# Patient Record
Sex: Male | Born: 1955 | Race: White | Hispanic: No | Marital: Married | Smoking: Never smoker
Health system: Southern US, Community
[De-identification: ages and names within clinical notes are randomized; demographics above are authoritative.]

## PROBLEM LIST (undated history)

## (undated) DIAGNOSIS — I10 Essential (primary) hypertension: Secondary | ICD-10-CM

## (undated) DIAGNOSIS — G4730 Sleep apnea, unspecified: Secondary | ICD-10-CM

## (undated) HISTORY — DX: Essential (primary) hypertension: I10

## (undated) HISTORY — DX: Sleep apnea, unspecified: G47.30

## (undated) HISTORY — PX: COLONOSCOPY: SHX174

---

## 1998-05-10 ENCOUNTER — Encounter: Admission: RE | Admit: 1998-05-10 | Discharge: 1998-05-10 | Payer: Self-pay | Admitting: Family Medicine

## 1998-05-15 ENCOUNTER — Encounter: Admission: RE | Admit: 1998-05-15 | Discharge: 1998-05-15 | Payer: Self-pay | Admitting: Family Medicine

## 2001-04-23 ENCOUNTER — Emergency Department (HOSPITAL_COMMUNITY): Admission: EM | Admit: 2001-04-23 | Discharge: 2001-04-23 | Payer: Self-pay | Admitting: Emergency Medicine

## 2001-04-23 ENCOUNTER — Encounter: Payer: Self-pay | Admitting: Emergency Medicine

## 2004-07-15 ENCOUNTER — Ambulatory Visit: Payer: Self-pay | Admitting: Internal Medicine

## 2004-07-24 ENCOUNTER — Ambulatory Visit (HOSPITAL_BASED_OUTPATIENT_CLINIC_OR_DEPARTMENT_OTHER): Admission: RE | Admit: 2004-07-24 | Discharge: 2004-07-24 | Payer: Self-pay | Admitting: Internal Medicine

## 2004-07-24 ENCOUNTER — Encounter: Payer: Self-pay | Admitting: Internal Medicine

## 2004-07-27 ENCOUNTER — Ambulatory Visit: Payer: Self-pay | Admitting: Internal Medicine

## 2004-08-07 ENCOUNTER — Ambulatory Visit: Payer: Self-pay | Admitting: Internal Medicine

## 2004-09-17 ENCOUNTER — Ambulatory Visit: Payer: Self-pay | Admitting: Internal Medicine

## 2004-10-22 ENCOUNTER — Ambulatory Visit: Payer: Self-pay | Admitting: Internal Medicine

## 2005-02-03 ENCOUNTER — Emergency Department (HOSPITAL_COMMUNITY): Admission: EM | Admit: 2005-02-03 | Discharge: 2005-02-03 | Payer: Self-pay | Admitting: Emergency Medicine

## 2005-04-16 ENCOUNTER — Emergency Department (HOSPITAL_COMMUNITY): Admission: EM | Admit: 2005-04-16 | Discharge: 2005-04-16 | Payer: Self-pay | Admitting: Family Medicine

## 2005-04-17 ENCOUNTER — Emergency Department (HOSPITAL_COMMUNITY): Admission: EM | Admit: 2005-04-17 | Discharge: 2005-04-17 | Payer: Self-pay | Admitting: Emergency Medicine

## 2005-04-23 ENCOUNTER — Ambulatory Visit: Payer: Self-pay | Admitting: Internal Medicine

## 2006-04-23 ENCOUNTER — Ambulatory Visit: Payer: Self-pay | Admitting: Internal Medicine

## 2007-01-17 ENCOUNTER — Ambulatory Visit: Payer: Self-pay | Admitting: Family Medicine

## 2007-01-17 DIAGNOSIS — J4540 Moderate persistent asthma, uncomplicated: Secondary | ICD-10-CM | POA: Insufficient documentation

## 2007-01-17 DIAGNOSIS — G4733 Obstructive sleep apnea (adult) (pediatric): Secondary | ICD-10-CM | POA: Insufficient documentation

## 2007-01-24 ENCOUNTER — Ambulatory Visit: Payer: Self-pay | Admitting: Family Medicine

## 2007-01-24 DIAGNOSIS — I10 Essential (primary) hypertension: Secondary | ICD-10-CM | POA: Insufficient documentation

## 2007-01-24 DIAGNOSIS — I152 Hypertension secondary to endocrine disorders: Secondary | ICD-10-CM | POA: Insufficient documentation

## 2007-01-25 LAB — CONVERTED CEMR LAB
ALT: 30 units/L (ref 0–53)
AST: 20 units/L (ref 0–37)
Albumin: 3.7 g/dL (ref 3.5–5.2)
Alkaline Phosphatase: 70 units/L (ref 39–117)
BUN: 9 mg/dL (ref 6–23)
Bilirubin, Direct: 0.1 mg/dL (ref 0.0–0.3)
CO2: 31 meq/L (ref 19–32)
Calcium: 9.2 mg/dL (ref 8.4–10.5)
Chloride: 108 meq/L (ref 96–112)
Cholesterol: 174 mg/dL (ref 0–200)
Creatinine, Ser: 0.9 mg/dL (ref 0.4–1.5)
GFR calc Af Amer: 115 mL/min
GFR calc non Af Amer: 95 mL/min
Glucose, Bld: 92 mg/dL (ref 70–99)
HDL: 34.3 mg/dL — ABNORMAL LOW (ref >39.0)
LDL Cholesterol: 125 mg/dL — ABNORMAL HIGH (ref 0–99)
PSA: 0.57 ng/mL (ref 0.10–4.00)
Potassium: 4.1 meq/L (ref 3.5–5.1)
Sodium: 143 meq/L (ref 135–145)
Total Bilirubin: 0.8 mg/dL (ref 0.3–1.2)
Total CHOL/HDL Ratio: 5.1
Total Protein: 6.4 g/dL (ref 6.0–8.3)
Triglycerides: 75 mg/dL (ref 0–149)
VLDL: 15 mg/dL (ref 0–40)

## 2007-02-07 ENCOUNTER — Ambulatory Visit: Payer: Self-pay | Admitting: Gastroenterology

## 2007-02-09 DIAGNOSIS — J309 Allergic rhinitis, unspecified: Secondary | ICD-10-CM | POA: Insufficient documentation

## 2007-02-21 ENCOUNTER — Encounter: Payer: Self-pay | Admitting: Family Medicine

## 2007-02-21 ENCOUNTER — Ambulatory Visit: Payer: Self-pay | Admitting: Gastroenterology

## 2007-02-21 ENCOUNTER — Encounter: Payer: Self-pay | Admitting: Gastroenterology

## 2007-02-24 ENCOUNTER — Encounter: Payer: Self-pay | Admitting: Family Medicine

## 2007-02-24 ENCOUNTER — Encounter (INDEPENDENT_AMBULATORY_CARE_PROVIDER_SITE_OTHER): Payer: Self-pay | Admitting: *Deleted

## 2007-04-14 ENCOUNTER — Encounter: Payer: Self-pay | Admitting: Internal Medicine

## 2007-05-02 ENCOUNTER — Encounter: Payer: Self-pay | Admitting: Internal Medicine

## 2007-07-28 ENCOUNTER — Ambulatory Visit: Payer: Self-pay | Admitting: Family Medicine

## 2007-09-05 ENCOUNTER — Encounter: Payer: Self-pay | Admitting: Internal Medicine

## 2007-09-05 ENCOUNTER — Encounter (INDEPENDENT_AMBULATORY_CARE_PROVIDER_SITE_OTHER): Payer: Self-pay | Admitting: *Deleted

## 2007-09-06 ENCOUNTER — Ambulatory Visit: Payer: Self-pay | Admitting: Family Medicine

## 2007-09-06 ENCOUNTER — Telehealth: Payer: Self-pay | Admitting: Family Medicine

## 2007-09-06 LAB — CONVERTED CEMR LAB
Bilirubin Urine: NEGATIVE
Blood in Urine, dipstick: NEGATIVE
Glucose, Urine, Semiquant: NEGATIVE
Ketones, urine, test strip: NEGATIVE
Nitrite: NEGATIVE
Protein, U semiquant: NEGATIVE
Specific Gravity, Urine: 1.015
Urobilinogen, UA: 0.2
WBC Urine, dipstick: NEGATIVE
pH: 6

## 2007-09-07 LAB — CONVERTED CEMR LAB
Basophils Absolute: 0 10*3/uL (ref 0.0–0.1)
Basophils Relative: 0.7 % (ref 0.0–1.0)
Eosinophils Absolute: 0.3 10*3/uL (ref 0.0–0.7)
Eosinophils Relative: 4.3 % (ref 0.0–5.0)
HCT: 43.5 % (ref 39.0–52.0)
Hemoglobin: 14.5 g/dL (ref 13.0–17.0)
Lymphocytes Relative: 31.5 % (ref 12.0–46.0)
MCHC: 33.4 g/dL (ref 30.0–36.0)
MCV: 86.2 fL (ref 78.0–100.0)
Monocytes Absolute: 0.7 10*3/uL (ref 0.1–1.0)
Monocytes Relative: 10.2 % (ref 3.0–12.0)
Neutro Abs: 3.7 10*3/uL (ref 1.4–7.7)
Neutrophils Relative %: 53.3 % (ref 43.0–77.0)
Platelets: 212 10*3/uL (ref 150–400)
RBC: 5.04 M/uL (ref 4.22–5.81)
RDW: 13.8 % (ref 11.5–14.6)
TSH: 1.59 microintl units/mL (ref 0.35–5.50)
WBC: 6.9 10*3/uL (ref 4.5–10.5)

## 2007-09-22 ENCOUNTER — Ambulatory Visit: Payer: Self-pay | Admitting: Family Medicine

## 2007-09-22 LAB — CONVERTED CEMR LAB
BUN: 10 mg/dL (ref 6–23)
CO2: 30 meq/L (ref 19–32)
Calcium: 9.4 mg/dL (ref 8.4–10.5)
Chloride: 106 meq/L (ref 96–112)
Creatinine, Ser: 1 mg/dL (ref 0.4–1.5)
GFR calc Af Amer: 101 mL/min
GFR calc non Af Amer: 84 mL/min
Glucose, Bld: 104 mg/dL — ABNORMAL HIGH (ref 70–99)
Potassium: 4 meq/L (ref 3.5–5.1)
Sodium: 141 meq/L (ref 135–145)

## 2007-09-23 ENCOUNTER — Ambulatory Visit: Payer: Self-pay | Admitting: Internal Medicine

## 2007-10-07 ENCOUNTER — Ambulatory Visit: Payer: Self-pay | Admitting: Family Medicine

## 2007-10-07 LAB — CONVERTED CEMR LAB
BUN: 12 mg/dL (ref 6–23)
CO2: 29 meq/L (ref 19–32)
Calcium: 9.5 mg/dL (ref 8.4–10.5)
Chloride: 101 meq/L (ref 96–112)
Creatinine, Ser: 1 mg/dL (ref 0.4–1.5)
GFR calc Af Amer: 101 mL/min
GFR calc non Af Amer: 84 mL/min
Glucose, Bld: 86 mg/dL (ref 70–99)
Potassium: 3.9 meq/L (ref 3.5–5.1)
Sodium: 138 meq/L (ref 135–145)

## 2007-11-10 ENCOUNTER — Ambulatory Visit: Payer: Self-pay | Admitting: Family Medicine

## 2007-11-10 DIAGNOSIS — N529 Male erectile dysfunction, unspecified: Secondary | ICD-10-CM | POA: Insufficient documentation

## 2008-02-12 ENCOUNTER — Emergency Department (HOSPITAL_COMMUNITY): Admission: EM | Admit: 2008-02-12 | Discharge: 2008-02-12 | Payer: Self-pay | Admitting: Family Medicine

## 2008-02-13 ENCOUNTER — Encounter (INDEPENDENT_AMBULATORY_CARE_PROVIDER_SITE_OTHER): Payer: Self-pay | Admitting: *Deleted

## 2008-02-13 ENCOUNTER — Ambulatory Visit: Payer: Self-pay | Admitting: Family Medicine

## 2008-02-15 ENCOUNTER — Telehealth (INDEPENDENT_AMBULATORY_CARE_PROVIDER_SITE_OTHER): Payer: Self-pay | Admitting: *Deleted

## 2008-02-16 ENCOUNTER — Encounter (INDEPENDENT_AMBULATORY_CARE_PROVIDER_SITE_OTHER): Payer: Self-pay | Admitting: *Deleted

## 2008-05-10 ENCOUNTER — Encounter (INDEPENDENT_AMBULATORY_CARE_PROVIDER_SITE_OTHER): Payer: Self-pay | Admitting: *Deleted

## 2008-06-15 ENCOUNTER — Ambulatory Visit: Payer: Self-pay | Admitting: Family Medicine

## 2008-06-22 ENCOUNTER — Ambulatory Visit: Payer: Self-pay | Admitting: Family Medicine

## 2008-06-22 LAB — CONVERTED CEMR LAB
ALT: 32 units/L (ref 0–53)
AST: 29 units/L (ref 0–37)
Albumin: 4.1 g/dL (ref 3.5–5.2)
Alkaline Phosphatase: 66 units/L (ref 39–117)
BUN: 11 mg/dL (ref 6–23)
Bilirubin, Direct: 0.1 mg/dL (ref 0.0–0.3)
CO2: 32 meq/L (ref 19–32)
Calcium: 9.5 mg/dL (ref 8.4–10.5)
Chloride: 101 meq/L (ref 96–112)
Cholesterol, target level: 200 mg/dL
Cholesterol: 209 mg/dL (ref 0–200)
Creatinine, Ser: 1 mg/dL (ref 0.4–1.5)
Direct LDL: 139.1 mg/dL
GFR calc Af Amer: 101 mL/min
GFR calc non Af Amer: 84 mL/min
Glucose, Bld: 98 mg/dL (ref 70–99)
HDL goal, serum: 40 mg/dL
HDL: 35.5 mg/dL — ABNORMAL LOW (ref >39.0)
LDL Goal: 130 mg/dL
PSA: 0.43 ng/mL (ref 0.10–4.00)
PSA: 0.43 ng/mL
Potassium: 3.9 meq/L (ref 3.5–5.1)
Sodium: 140 meq/L (ref 135–145)
Total Bilirubin: 0.9 mg/dL (ref 0.3–1.2)
Total CHOL/HDL Ratio: 5.9
Total Protein: 7 g/dL (ref 6.0–8.3)
Triglycerides: 121 mg/dL (ref 0–149)
VLDL: 24 mg/dL (ref 0–40)

## 2008-07-16 ENCOUNTER — Telehealth: Payer: Self-pay | Admitting: Family Medicine

## 2008-09-13 ENCOUNTER — Ambulatory Visit: Payer: Self-pay | Admitting: Family Medicine

## 2008-09-17 LAB — CONVERTED CEMR LAB
Cholesterol: 186 mg/dL (ref 0–200)
HDL: 38 mg/dL — ABNORMAL LOW (ref >39.00)
LDL Cholesterol: 132 mg/dL — ABNORMAL HIGH (ref 0–99)
Total CHOL/HDL Ratio: 5
Triglycerides: 79 mg/dL (ref 0.0–149.0)
VLDL: 15.8 mg/dL (ref 0.0–40.0)

## 2008-09-21 ENCOUNTER — Ambulatory Visit: Payer: Self-pay | Admitting: Internal Medicine

## 2008-10-19 ENCOUNTER — Encounter: Payer: Self-pay | Admitting: Internal Medicine

## 2009-03-20 ENCOUNTER — Ambulatory Visit: Payer: Self-pay | Admitting: Family Medicine

## 2009-03-25 LAB — CONVERTED CEMR LAB
Cholesterol: 190 mg/dL (ref 0–200)
HDL: 41.9 mg/dL (ref >39.00)
LDL Cholesterol: 134 mg/dL — ABNORMAL HIGH (ref 0–99)
Total CHOL/HDL Ratio: 5
Triglycerides: 71 mg/dL (ref 0.0–149.0)
VLDL: 14.2 mg/dL (ref 0.0–40.0)

## 2009-06-25 ENCOUNTER — Encounter (INDEPENDENT_AMBULATORY_CARE_PROVIDER_SITE_OTHER): Payer: Self-pay | Admitting: *Deleted

## 2009-06-25 ENCOUNTER — Ambulatory Visit: Payer: Self-pay | Admitting: Family Medicine

## 2009-06-26 ENCOUNTER — Ambulatory Visit: Payer: Self-pay | Admitting: Family Medicine

## 2009-09-05 ENCOUNTER — Telehealth: Payer: Self-pay | Admitting: Family Medicine

## 2009-09-16 ENCOUNTER — Ambulatory Visit: Payer: Self-pay | Admitting: Family Medicine

## 2009-09-18 LAB — CONVERTED CEMR LAB
ALT: 35 units/L (ref 0–53)
AST: 27 units/L (ref 0–37)
Albumin: 4 g/dL (ref 3.5–5.2)
Alkaline Phosphatase: 50 units/L (ref 39–117)
BUN: 13 mg/dL (ref 6–23)
Bilirubin, Direct: 0.1 mg/dL (ref 0.0–0.3)
CO2: 30 meq/L (ref 19–32)
Calcium: 9.2 mg/dL (ref 8.4–10.5)
Chloride: 103 meq/L (ref 96–112)
Cholesterol: 204 mg/dL — ABNORMAL HIGH (ref 0–200)
Creatinine, Ser: 1 mg/dL (ref 0.4–1.5)
Direct LDL: 141.7 mg/dL
GFR calc non Af Amer: 86 mL/min (ref >60)
Glucose, Bld: 96 mg/dL (ref 70–99)
HDL: 50.2 mg/dL (ref >39.00)
PSA: 0.63 ng/mL (ref 0.10–4.00)
Potassium: 4.1 meq/L (ref 3.5–5.1)
Sodium: 140 meq/L (ref 135–145)
Total Bilirubin: 0.6 mg/dL (ref 0.3–1.2)
Total CHOL/HDL Ratio: 4
Total Protein: 6.7 g/dL (ref 6.0–8.3)
Triglycerides: 78 mg/dL (ref 0.0–149.0)
VLDL: 15.6 mg/dL (ref 0.0–40.0)

## 2009-09-20 ENCOUNTER — Ambulatory Visit: Payer: Self-pay | Admitting: Internal Medicine

## 2009-10-09 ENCOUNTER — Ambulatory Visit: Payer: Self-pay | Admitting: Family Medicine

## 2009-10-09 DIAGNOSIS — M67919 Unspecified disorder of synovium and tendon, unspecified shoulder: Secondary | ICD-10-CM | POA: Insufficient documentation

## 2009-10-09 DIAGNOSIS — M719 Bursopathy, unspecified: Secondary | ICD-10-CM

## 2009-11-27 ENCOUNTER — Telehealth: Payer: Self-pay | Admitting: Family Medicine

## 2009-12-04 ENCOUNTER — Ambulatory Visit: Payer: Self-pay | Admitting: Family Medicine

## 2009-12-16 ENCOUNTER — Encounter: Payer: Self-pay | Admitting: Family Medicine

## 2009-12-23 ENCOUNTER — Encounter: Payer: Self-pay | Admitting: Family Medicine

## 2010-01-03 ENCOUNTER — Ambulatory Visit: Payer: Self-pay | Admitting: Family Medicine

## 2010-02-19 ENCOUNTER — Telehealth: Payer: Self-pay | Admitting: Family Medicine

## 2010-03-12 ENCOUNTER — Telehealth: Payer: Self-pay | Admitting: Family Medicine

## 2010-03-25 ENCOUNTER — Encounter: Payer: Self-pay | Admitting: Family Medicine

## 2010-03-26 ENCOUNTER — Telehealth: Payer: Self-pay | Admitting: Family Medicine

## 2010-05-27 ENCOUNTER — Ambulatory Visit
Admission: RE | Admit: 2010-05-27 | Discharge: 2010-05-27 | Payer: Self-pay | Source: Home / Self Care | Attending: Family Medicine | Admitting: Family Medicine

## 2010-05-27 NOTE — Miscellaneous (Signed)
Summary: PT Eval/[facility]  PT Eval/[facility]   Imported By: Lanelle Bal 12/23/2009 09:32:11  _____________________________________________________________________  External Attachment:    Type:   Image     Comment:   External Document

## 2010-05-27 NOTE — Progress Notes (Signed)
Summary: refill request for diclofenac  Phone Note Refill Request Call back at [phone] Message from:  Patient  Refills Requested: Medication #1:  DICLOFENAC SODIUM 75 MG TBEC 1 tab by mouth two times a day Pt is asking for a refill to be sent to [facility] in [place].  He has appt with Dr. Ermalene Searing on 04/02/10.  Initial call taken by: Lowella Petties CMA, AAMA,  March 26, 2010 3:31 PM    Prescriptions: DICLOFENAC SODIUM 75 MG TBEC (DICLOFENAC SODIUM) 1 tab by mouth two times a day  #30 x 0   Entered and Authorized by:   Hannah Beat MD   Signed by:   Hannah Beat MD on 03/26/2010   Method used:   Electronically to        [facility] [address] (retail)       [address] [place], [place]  [address]       Ph: [phone]       Fax: [phone]   RxID:   [id number]

## 2010-05-27 NOTE — Miscellaneous (Signed)
Summary: PT Update/[facility]  PT Update/[facility]   Imported By: Lanelle Bal 01/01/2010 11:56:50  _____________________________________________________________________  External Attachment:    Type:   Image     Comment:   External Document

## 2010-05-27 NOTE — Assessment & Plan Note (Signed)
Summary: INJECT SHOULDER, PER DR BEDSOLE   Vital Signs:  Patient profile:   55 year old male Height:      72 inches Weight:      257.0 pounds BMI:     34.98 Temp:     98.6 degrees F oral Pulse rate:   60 / minute Pulse rhythm:   regular BP sitting:   120 / 76  (left arm) Cuff size:   large  Vitals Entered By: Benny Lennert CMA Duncan Dull) (December 04, 2009 3:53 PM)  History of Present Illness: Dr. Ermalene Searing has requested a consult for the evaluation of shoulder pain:  The patient noted above presents with shoulder pain that has been ongoing for 3 mo there is no history of trauma or accident. The patient denies neck pain or radicular symptoms. Denies dislocation, subluxation, separation of the shoulder. The patient does complain of pain in the overhead plane.  Medications Tried: voltaren Tried PT: No  Prior shoulder Injury: No Prior surgery: No Prior fracture: No   REVIEW OF SYSTEMS  GEN: No systemic complaints, no fevers, chills, sweats, or other acute illnesses MSK: Detailed in the HPI GI: tolerating PO intake without difficulty Neuro: No numbness, parasthesias, or tingling associated. Otherwise the pertinent positives of the ROS are noted above.    Allergies: 1)  ! * Flu Vaccination  Past History:  Past medical, surgical, family and social histories (including risk factors) reviewed, and no changes noted (except as noted below).  Past Medical History: Reviewed history from 09/23/2007 and no changes required. Asthma Hypertension Sleep Apnea  Past Surgical History: Reviewed history from 01/17/2007 and no changes required. Denies surgical history  Family History: Reviewed history from 09/06/2007 and no changes required. father died age 41 prostate cancer, HTN mother died age 40 ? breast cancer brother HTN no MI < age 47 son with Grave's disease  Social History: Reviewed history from 09/20/2009 and no changes required. Occupation:custodian, yard  work Married Never Smoked Alcohol use-no Drug use-no Regular exercise-yes, walks 1 mile day Diet: recent diet changes,  fuits and veggies, water, stopping soda  Physical Exam  General:  GEN: Well-developed,well-nourished,in no acute distress; alert,appropriate and cooperative throughout examination HEENT: Normocephalic and atraumatic without obvious abnormalities. No apparent alopecia or balding. Ears, externally no deformities PULM: Breathing comfortably in no respiratory distress EXT: No clubbing, cyanosis, or edema PSYCH: Normally interactive. Cooperative during the interview. Pleasant. Friendly and conversant. Not anxious or depressed appearing. Normal, full affect.  Msk:  Shoulder: L Inspection: No muscle wasting or winging Ecchymosis/edema: neg  AC joint, scapula, clavicle: NT Cervical spine: NT, full ROM Spurling's: neg Abduction: full, 5/5 Flexion: full, 5/5 IR, full, lift-off: 5/5 ER at neutral: full, 5/5 AC crossover: neg Neer: pos Hawkins: pos Drop Test: neg Empty Can: pos Supraspinatus insertion: mild-mod T Bicipital groove: NT Speed's: neg Yergason's: neg Sulcus sign: neg Scapular dyskinesis: none C5-T1 intact  Neuro: Sensation intact Grip 5/5    Impression & Recommendations:  Problem # 1:  ROTATOR CUFF SYNDROME, LEFT (ICD-726.10) Shoulder anatomy was reviewed.  Recommendations include Rotator cuff strengthening and scapular stabilization exercises were reviewed with the patient.  A handout was given based on a shoulder program from [facility]  Retraining shoulder mechanics and function was emphasized to the patient with rehab done at least 5-6 days a week.  Recommend formal PT to assist with scapular stabilization and RTC strengthening.   SubAC Injection, L Verbal consent was obtained from the patient. Risks, benefits, and alternatives were explained. Patient  prepped with Betadine and Ethyl Chloride used for anesthesia. The subacromial space was  injected using the posterior approach. The patient tolerated the procedure well and had decreased pain post injection. No complications. Injection: 9 cc of Lidocaine 1% and 1cc of Kenalog 40 mg. Needle: 22 gauge   cc: Dr. Ermalene Searing  Orders: Physical Therapy Referral (PT) Joint Aspirate / Injection, Large (20610) Kenalog 10mg  (4units) (J3301)  Complete Medication List: 1)  Advair Diskus 100-50 Mcg/dose Misc (Fluticasone-salmeterol) .Marland Kitchen.. 1 inhalation two times a day 2)  Cpap-11cm [facility]  3)  Aspirin 81 Mg Tabs (Aspirin) .... Take 1 tablet by mouth once a day 4)  Lisinopril-hydrochlorothiazide 20-25 Mg Tabs (Lisinopril-hydrochlorothiazide) .... Take 1 tablet by mouth once a day 5)  Ventolin Hfa 108 (90 Base) Mcg/act Aers (Albuterol sulfate) .... 2 puffs four times a day as needed 6)  Multivitamins Tabs (Multiple vitamin) .... Take 1 by mouth once daily 7)  Diclofenac Sodium 75 Mg Tbec (Diclofenac sodium) .Marland Kitchen.. 1 tab by mouth two times a day  Patient Instructions: 1)  Referral Appointment Information 2)  Day/Date: 3)  Time: 4)  Place/MD: 5)  Address: 6)  Phone/Fax: 7)  Patient given appointment information. Information/Orders faxed/mailed.   Current Allergies (reviewed today): ! * FLU VACCINATION

## 2010-05-27 NOTE — Letter (Signed)
Summary: Out of Work  [facility] at [facility]  [address] [place], [place] [address]   Phone: [phone]  Fax: [phone]    June 25, 2009   Employee:  JILES GOYA    To Whom It May Concern:   For Medical reasons, please excuse the above named employee from work for the following dates:  Start:  June 25, 2009 12:57 PM   End:   Mat return on March 7th 2011  If you need additional information, please feel free to contact our office.         Sincerely,    Kerby Nora MD

## 2010-05-27 NOTE — Assessment & Plan Note (Signed)
Summary: ? PULLED MUSCLE IN BACK   Vital Signs:  Patient profile:   55 year old male Weight:      253 pounds Temp:     99.2 degrees F oral Pulse rate:   56 / minute Pulse rhythm:   regular BP sitting:   138 / 90  (right arm) Cuff size:   large  Vitals Entered By: Lowella Petties CMA (January 03, 2010 12:19 PM) CC: Left low back pain   History of Present Illness: 2 days ago while doing yard work...was pulling on weeds rooted in ground. Since then low back spasm and pain. Left lower back. No weakness, no numbness. Minimal improvement with aleve and advil. No radaiting pain.  No fever.   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 1 Inhalation Two Times A Day 2)  Cpap-11cm [facility] 3)  Aspirin 81 Mg  Tabs (Aspirin) .... Take 1 Tablet By Mouth Once A Day 4)  Lisinopril-Hydrochlorothiazide 20-25 Mg  Tabs (Lisinopril-Hydrochlorothiazide) .... Take 1 Tablet By Mouth Once A Day 5)  Ventolin Hfa 108 (90 Base) Mcg/act  Aers (Albuterol Sulfate) .... 2 Puffs Four Times A Day As Needed 6)  Multivitamins  Tabs (Multiple Vitamin) .... Take 1 By Mouth Once Daily 7)  Diclofenac Sodium 75 Mg Tbec (Diclofenac Sodium) .Marland Kitchen.. 1 Tab By Mouth Two Times A Day  Allergies (verified): 1)  ! * Flu Vaccination  Past History:  Past  medical, surgical, family and social histories (including risk factors) reviewed, and no changes noted (except as noted below).  Past Medical History: Reviewed history from 09/23/2007 and no changes required. Asthma Hypertension Sleep Apnea  Past Surgical History: Reviewed history from 01/17/2007 and no changes required. Denies surgical history  Family History: Reviewed history from 09/06/2007 and no changes required. father died age 60 prostate cancer, HTN mother died age 27 ? breast cancer brother HTN no MI < age 58 son with Grave's disease  Social History: Reviewed history from 09/20/2009 and no changes required. 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 CV:  Denies chest pain or discomfort. Resp:  Denies shortness of breath. GU:  Denies dysuria.  Physical Exam  General:  Overweight male in NAD Mouth:  MMM Lungs:  Normal respiratory effort, chest expands symmetrically. Lungs are clear to auscultation, no crackles or wheezes. Heart:  Normal rate and regular rhythm. S1 and S2 normal without gallop, murmur, click, rub or other extra sounds. Abdomen:  Bowel sounds positive,abdomen soft and non-tender without masses, organomegaly or hernias noted. Msk:  ttp left paraspinous muscle, no vertebral ttp, neg SLR and Faber's  Neurologic:  No cranial nerve deficits noted. Station and gait are normal.  DTRs are symmetrical throughout. Sensory, motor  and coordinative functions appear intact.   Impression & Recommendations:  Problem # 1:  LUMBOSACRAL STRAIN, ACUTE (ICD-846.0) Treat with heat, NSAIds, muscle relaxant and gentle stretching. Info given.  Work limitations given.  Follow up if not improving in 2 weeks.   Complete Medication List: 1)  Advair Diskus 100-50 Mcg/dose  Misc (Fluticasone-salmeterol) .Marland Kitchen.. 1 inhalation two times a day 2)  Cpap-11cm [facility]  3)  Aspirin 81 Mg Tabs (Aspirin) .... Take 1 tablet by mouth once a day 4)  Lisinopril-hydrochlorothiazide 20-25 Mg Tabs (Lisinopril-hydrochlorothiazide) .... Take 1 tablet by mouth once a day 5)  Ventolin Hfa 108 (90 Base) Mcg/act Aers (Albuterol sulfate) .... 2 puffs four times a day as needed 6)  Multivitamins Tabs (Multiple vitamin) .... Take 1 by mouth once daily 7)  Diclofenac Sodium 75 Mg Tbec (Diclofenac sodium) .Marland Kitchen.. 1 tab by mouth two times a day 8)  Cyclobenzaprine Hcl 10 Mg Tabs (Cyclobenzaprine hcl) .Marland Kitchen.. 1 tab by mouth at bedtime as needed pain  Patient Instructions: 1)  Heat on painful area, start diclofenac as needed pain during the day, muscle relaxant at night. 2)   Gentle stretching exercsies. 3)   Follow up if not improving in 2 weeks. 4)  No lifting greater than 10 lbs, no repetitive bending or twisting at waist.  Prescriptions: CYCLOBENZAPRINE HCL 10 MG TABS (CYCLOBENZAPRINE HCL) 1 tab by mouth at bedtime as needed pain  #15 x 0   Entered and Authorized by:   Kerby Nora MD   Signed by:   Kerby Nora MD on 01/03/2010   Method used:   Print then Give to Patient   RxID:   [id number] DICLOFENAC SODIUM 75 MG TBEC (DICLOFENAC SODIUM) 1 tab by mouth two times a day  #30 x 0   Entered and Authorized by:   Kerby Nora MD   Signed by:   Kerby Nora MD on 01/03/2010   Method used:   Print then Give to Patient   RxID:   [id number]   Prior Medications (reviewed today): ADVAIR DISKUS 100-50 MCG/DOSE  MISC (FLUTICASONE-SALMETEROL) 1 inhalation two times a day CPAP-11CM [facility] ()  ASPIRIN 81 MG  TABS (ASPIRIN) Take 1 tablet by mouth once a day LISINOPRIL-HYDROCHLOROTHIAZIDE 20-25 MG  TABS (LISINOPRIL-HYDROCHLOROTHIAZIDE) Take 1 tablet by mouth once a day VENTOLIN HFA 108 (90 BASE) MCG/ACT  AERS (ALBUTEROL SULFATE) 2 puffs four times a day as  needed MULTIVITAMINS  TABS (MULTIPLE VITAMIN) take 1 by mouth once daily Current Allergies (reviewed today): ! * FLU VACCINATION

## 2010-05-27 NOTE — Assessment & Plan Note (Signed)
Summary: ROA FOR FOLLOW-UP/J[facility]   Vital Signs:  Patient profile:   55 year old male Height:      72 inches Weight:      247.6 pounds BMI:     33.70 O2 Sat:      97 % on RA Temp:     97.9 degrees F oral Pulse rate:   72 / minute Pulse rhythm:   regular BP sitting:   122 / 78  (left arm) Cuff size:   regular  Vitals Entered By: Benny Lennert CMA (AAMA) (June 26, 2009 11:59 AM)  O2 Flow:  RA  History of Present Illness: Chief complaint follow up flu  Acute asthma exacerbation: s/p kenalog injection on day 1 prednisone taper. On day 2 of tamiflu.  Today he states he is feeling better. Breathing improved. He is using albuterol every 6 hours. helps some temporarily.  Coughing a lot at night given increase in mucus. Energy better.  No fever.   Allergies: 1)  ! * Flu Vaccination  Physical Exam  General:  obese appearing male in NAD Mouth:  MMM Neck:  no carotid bruit or thyromegaly no cervical or supraclavicular lymphadenopathy  Lungs:  decrease in wheeze and improvement in air movement  No rhonchi no rales  Heart:  Normal rate and regular rhythm. S1 and S2 normal without gallop, murmur, click, rub or other extra sounds. Pulses:  R and L posterior tibial pulses are full and equal bilaterally  Extremities:  no edema    Impression & Recommendations:  Problem # 1:  ASTHMA, WITH ACUTE EXACERBATION (ICD-493.92) :Improved but peak flows still yellow range. Continue prednisone, albuterol as needed and treatment for flu. Call if SOB not continuing to improve.  HAs follow up with Dr. Shelle Iron in 08/2009. GAve codeine/guafenesin for cough keeping him up at night.     Singulair 10 Mg Tabs (Montelukast sodium) .Marland Kitchen... Take 1 by mouth once daily His updated medication list for this problem includes:    Advair Diskus 100-50 Mcg/dose Misc (Fluticasone-salmeterol) .Marland Kitchen... 1 inhalation two times a day    Ventolin Hfa 108 (90 Base) Mcg/act Aers (Albuterol sulfate) .Marland Kitchen... 2 puffs four times a day  as needed    Prednisone 20 Mg Tabs (Prednisone) .Marland KitchenMarland KitchenMarland KitchenMarland Kitchen 3 tabs by mouth daily x 3 days, then 2 tabs by mouth daily x 2 days then 1 tab by mouth daily x 2 days  Problem # 2:  INFLUENZA (ICD-487.8)  Complete Medication List: 1)  Advair Diskus 100-50 Mcg/dose Misc (Fluticasone-salmeterol) .Marland Kitchen.. 1 inhalation two times a day 2)  Cpap-11cm [facility]  3)  Aspirin 81 Mg Tabs (Aspirin) .... Take 1 tablet by mouth once a day 4)  Vitamin C 500 Mg Tabs (Ascorbic acid) .... Take 1 tablet by mouth once a day 5)  Lisinopril-hydrochlorothiazide 20-25 Mg Tabs (Lisinopril-hydrochlorothiazide) .... Take 1 tablet by mouth once a day 6)  Ventolin Hfa 108 (90 Base) Mcg/act Aers (Albuterol sulfate) .... 2 puffs four times a day as needed 7)  Viagra 100 Mg Tabs (Sildenafil citrate) .... 1/4/to 1/2 tab by mouth as needed prior to sexual activity 8)  Midrin 325-65-100 Mg Caps (Apap-isometheptene-dichloral) .Marland Kitchen.. 1-2 caps by mouth q 4 hours 9)  Tamiflu 75 Mg Caps (Oseltamivir phosphate) .... Take one tablet by mouth twice a day for five days 10)  Prednisone 20 Mg Tabs (Prednisone) .... 3 tabs by mouth daily x 3 days, then 2 tabs by mouth daily x 2 days then 1 tab by mouth daily  x 2 days 11)  Cheratussin Ac 100-10 Mg/75ml Syrp (Guaifenesin-codeine) .Marland Kitchen.. 1-2 tsp by mouth at bedtime as needed cough  Patient Instructions: 1)  SCheduled in next few months CPX with fasting labs prior.  CMET, lipids Dx 272.0, PSA Dx v76.44 2)  Call if breathing worsening again.  Prescriptions: CHERATUSSIN AC 100-10 MG/5ML SYRP (GUAIFENESIN-CODEINE) 1-2 tsp by mouth at bedtime as needed cough  #8 oz x 0   Entered and Authorized by:   Kerby Nora MD   Signed by:   Kerby Nora MD on 06/26/2009   Method used:   Print then Give to Patient   RxID:   [id number]   Current Allergies (reviewed today): ! * FLU VACCINATION

## 2010-05-27 NOTE — Medication Information (Signed)
Summary: ED Device/[facility]  ED Device/[facility]   Imported By: Lanelle Bal 03/29/2010 09:20:57  _____________________________________________________________________  External Attachment:    Type:   Image     Comment:   External Document

## 2010-05-27 NOTE — Assessment & Plan Note (Signed)
Summary: LEFT SHOULDER PAIN/CLE   Vital Signs:  Patient profile:   55 year old male Height:      72 inches Weight:      254.4 pounds BMI:     34.63 Temp:     98.2 degrees F oral Pulse rate:   57 / minute Pulse rhythm:   regular BP sitting:   130 / 84  (left arm) Cuff size:   large  Vitals Entered By: Benny Lennert CMA Duncan Dull) (October 09, 2009 3:42 PM)  History of Present Illness: Chief complaint Left shoulder pain   In past 1 month left shoulder pain.Marland Kitchen no fall/no injuries. Sudden onset. Hurts most when lifting arm above head. No swelling or redness in joint. No numbness, no tingling, no weakness.  No neck pain. Pain 7-8/10. Using OTC tylenol arthritis, advil, asa...some  improvement  No past history of shoulder issues.  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 1 Inhalation Two Times A Day 2)  Cpap-11cm [facility] 3)  Aspirin 81 Mg  Tabs (Aspirin) .... Take 1 Tablet By Mouth Once A Day 4)  Lisinopril-Hydrochlorothiazide 20-25 Mg  Tabs (Lisinopril-Hydrochlorothiazide) .... Take 1 Tablet By Mouth Once A Day 5)  Ventolin Hfa 108 (90 Base) Mcg/act  Aers (Albuterol Sulfate) .... 2 Puffs Four Times A Day As Needed 6)  Multivitamins  Tabs (Multiple Vitamin) .... Take 1 By Mouth Once Daily 7)  Diclofenac Sodium 75 Mg Tbec (Diclofenac Sodium) .Marland Kitchen.. 1 Tab By Mouth  Two Times A Day  Allergies: 1)  ! * Flu Vaccination  Past History:  Past medical, surgical, family and social histories (including risk factors) reviewed, and no changes noted (except as noted below).  Past Medical History: Reviewed history from 09/23/2007 and no changes required. Asthma Hypertension Sleep Apnea  Past Surgical History: Reviewed history from 01/17/2007 and no changes required. Denies surgical history  Family History: Reviewed history from 09/06/2007 and no changes required. father died age 62 prostate cancer, HTN mother died age 30 ? breast cancer brother HTN no MI < age 23 son with Grave's disease  Social History: Reviewed history from 09/20/2009 and no changes required. 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 CV:  Denies chest pain or discomfort. Resp:  Denies shortness of breath.  Physical Exam  General:  Well-developed,well-nourished,in no acute distress; alert,appropriate and cooperative throughout examination Mouth:  Oral mucosa and oropharynx without lesions or exudates.  Teeth in good repair. Neck:  no carotid bruit or thyromegaly no cervical or supraclavicular lymphadenopathy  Lungs:  Normal respiratory effort, chest expands symmetrically. Lungs are clear to auscultation, no crackles or wheezes. Heart:  Normal rate and regular rhythm. S1 and S2 normal without gallop, murmur, click, rub or other extra sounds. Msk:  ttp subacromial ant  and posterior, pain with int and external rotation, positive impingement sign/neer's/empty can test. neg drop arm. neg spurling strength 5/5 upper ext and in rotator cuff muscles.    Impression & Recommendations:  Problem # 1:  ROTATOR CUFF SYNDROME, LEFT (ICD-726.10) NSAID, exercsies, ice, info given.  Follow up for injection if not improving in 2 weeks.   Complete Medication List: 1)  Advair Diskus 100-50 Mcg/dose Misc (Fluticasone-salmeterol) .Marland Kitchen.. 1 inhalation two times a day 2)  Cpap-11cm [facility]  3)  Aspirin 81 Mg Tabs (Aspirin) .... Take 1 tablet by mouth once a day 4)  Lisinopril-hydrochlorothiazide 20-25 Mg Tabs (Lisinopril-hydrochlorothiazide) .... Take 1 tablet by mouth once a day 5)  Ventolin Hfa 108 (90 Base) Mcg/act Aers (Albuterol sulfate) .... 2 puffs four times a day as needed 6)  Multivitamins Tabs (Multiple vitamin) .... Take 1 by mouth once daily 7)  Diclofenac Sodium 75 Mg Tbec (Diclofenac sodium) .Marland Kitchen.. 1 tab by mouth two times a day  Patient Instructions: 1)  No lifting greater than 10 lbs or repetitive reaching or twisting  in left shoulder x 2 weeks. 2)   Diclofenac two times a day x 1-2 weeks...take with food, call if stomach upset. Use as needed once pain improving. 3)  Call for follow up in 2 weeks IF not improving. Prescriptions: DICLOFENAC SODIUM 75 MG TBEC (DICLOFENAC SODIUM) 1 tab by mouth two times a day  #30 x 0   Entered and Authorized by:   Kerby Nora MD   Signed by:   Kerby Nora MD on 10/09/2009   Method used:   Electronically to        [facility] [address] (retail)       [address] [place], [place]  [address]       Ph: [phone]       Fax: [phone]   RxID:   [id number]   Current Allergies (reviewed today): ! * FLU VACCINATION

## 2010-05-27 NOTE — Progress Notes (Signed)
Summary: form for ED device  Phone Note From Pharmacy   Caller: [facility] Summary of Call: Order form for vacuum erection device is on your desk. Initial call taken by: Lowella Petties CMA, AAMA,  March 12, 2010 8:43 AM  Follow-up for Phone Call        Will complete on return. If needs to be done earlier.Marland Kitchenask Copland. Also call pt to verify he is interested in this device. Follow-up by: Kerby Nora MD,  March 12, 2010 4:06 PM  Additional Follow-up for Phone Call Additional follow up Details #1::        patient already signed release of info form and that is included in paperwork.Consuello Masse CMA   Additional Follow-up by: Benny Lennert CMA Duncan Dull),  March 13, 2010 11:26 AM    Additional Follow-up for Phone Call Additional follow up Details #2::    Can this wait until my return? Follow-up by: Kerby Nora MD,  March 13, 2010 4:12 PM  Additional Follow-up for Phone Call Additional follow up Details #3:: Details for Additional Follow-up Action Taken: yes Additional Follow-up by: Benny Lennert CMA Duncan Dull),  March 14, 2010 7:45 AM

## 2010-05-27 NOTE — Assessment & Plan Note (Signed)
Summary: URI   Vital Signs:  Patient profile:   55 year old male Height:      72 inches Weight:      252.8 pounds BMI:     34.41 O2 Sat:      96 % on Room air Temp:     100.0 degrees F oral Pulse rate:   72 / minute Pulse rhythm:   regular BP sitting:   110 / 60  (left arm) Cuff size:   regular  Vitals Entered By: Benny Lennert CMA Duncan Dull) (June 25, 2009 12:07 PM)  O2 Flow:  Room air  History of Present Illness: Chief complaint ? uri  Acute Visit History:      The patient complains of cough, fever, and headache.  These symptoms began 4 days ago.  He denies nasal discharge and sinus problems.  Other comments include: Chills, bodyaches Sudden onset Works at [employer]. OTC...out of ventolin, tyenol, theraflu.        His highest temperature has been subjective.        The patient notes wheezing and shortness of breath.  The character of the cough is described as productive.  There is no history of sleep interference associated with his cough.        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 1 Inhalation Two Times A Day 2)  Cpap-11cm [facility] 3)  Aspirin 81 Mg  Tabs (Aspirin) .... Take 1 Tablet By Mouth Once A Day 4)  Vitamin C 500 Mg  Tabs (Ascorbic Acid) .... Take 1 Tablet By Mouth Once A Day 5)  Lisinopril-Hydrochlorothiazide 20-25 Mg  Tabs  (Lisinopril-Hydrochlorothiazide) .... Take 1 Tablet By Mouth Once A Day 6)  Ventolin Hfa 108 (90 Base) Mcg/act  Aers (Albuterol Sulfate) .... 2 Puffs Four Times A Day As Needed 7)  Viagra 100 Mg  Tabs (Sildenafil Citrate) .... 1/4/to 1/2 Tab By Mouth As Needed Prior To Sexual Activity 8)  Midrin 325-65-100 Mg Caps (Apap-Isometheptene-Dichloral) .Marland Kitchen.. 1-2 Caps By Mouth Q 4 Hours 9)  Singulair 10 Mg Tabs (Montelukast Sodium) .... Take 1 By Mouth Once Daily 10)  Tamiflu 75 Mg Caps (Oseltamivir Phosphate) .... Take One Tablet By Mouth Twice A Day For Five Days 11)  Prednisone 20 Mg Tabs (Prednisone) .... 3 Tabs By Mouth Daily X 3 Days, Then 2 Tabs By Mouth Daily X 2 Days Then 1 Tab By Mouth Daily X 2 Days  Allergies: 1)  ! * Flu Vaccination  Past History:  Past medical, surgical, family and social histories (including risk factors) reviewed, and no changes noted (except as noted below).  Past Medical History: Reviewed history from 09/23/2007 and no changes required. Asthma Hypertension Sleep Apnea  Past Surgical History: Reviewed history from 01/17/2007 and no changes required. Denies surgical history  Family History: Reviewed history from 09/06/2007 and no changes required. father died age 54 prostate cancer, HTN mother died age 67 ?  breast cancer brother HTN no MI < age 45 son with Grave's disease  Social History: Reviewed history from 09/23/2007 and no changes required. Occupation:custodian, yard work Married Never Smoked Alcohol use-no Drug use-no Regular exercise-yes, walks 1 mile day Diet: recent diet changes, wt loss 15 lbs over last month fuits and veggies, water, stoppoing soda  Physical Exam  General:  obese appearing male in NAD Head:  no maxiallry sinus ttp Ears:  clear fluid B TMs Nose:  nasal discharge,mucosal pallor.   Mouth:  MMM Neck:  no carotid bruit or thyromegaly .ndeos  Lungs:  diffuse wheezes and limited aoir movement.Marland Kitchendecrease in wheeze and  improvement in air movement after nebulizer treatment Heart:  Normal rate and regular rhythm. S1 and S2 normal without gallop, murmur, click, rub or other extra sounds. Abdomen:  Bowel sounds positive,abdomen soft and non-tender without masses, organomegaly or hernias noted. Pulses:  R and L posterior tibial pulses are full and equal bilaterally  Extremities:  no edema    Impression & Recommendations:  Problem # 1:  ASTHMA, WITH ACUTE EXACERBATION (ICD-493.92) Moderate to severe exacerbation: Mild improvement with neb treatment. Gave steroid injection, and recs to start pred taper. Provided resucue inhaler. if breathing worsening aor requiring albuterol more than every 4 hours.Marland Kitchengo to ER. Close follow up with exam tommorow. If minimal improvement consider admission to hospital.  His updated medication list for this problem includes:    Advair Diskus 100-50 Mcg/dose Misc (Fluticasone-salmeterol) .Marland Kitchen... 1 inhalation two times a day    Ventolin Hfa 108 (90 Base) Mcg/act Aers (Albuterol sulfate) .Marland Kitchen... 2 puffs four times a day as needed    Singulair 10 Mg Tabs (Montelukast sodium) .Marland Kitchen... Take 1 by mouth once daily    Prednisone 20 Mg Tabs (Prednisone) .Marland KitchenMarland KitchenMarland KitchenMarland Kitchen 3 tabs by mouth daily x 3 days, then 2 tabs by mouth daily x 2 days then 1 tab by mouth daily x 2 days  Orders: Albuterol Sulfate Sol 1mg  unit dose (U0454) Nebulizer Tx (09811) Kenalog 10 mg inj (J3301) Admin of Therapeutic Inj  intramuscular or subcutaneous (91478)  Problem # 2:  INFLUENZA (ICD-487.8) Ttreat with tamiflu given respiratory symptoms and on borderline timeline for effectiveness.  Remain out of work untill breathing improved and fever resolved.   Complete Medication List: 1)  Advair Diskus 100-50 Mcg/dose Misc (Fluticasone-salmeterol) .Marland Kitchen.. 1 inhalation two times a day 2)  [facility]  3)  Aspirin 81 Mg Tabs (Aspirin) .... Take 1 tablet by mouth once a day 4)  Vitamin C 500 Mg Tabs (Ascorbic acid) .... Take  1 tablet by mouth once a day 5)  Lisinopril-hydrochlorothiazide 20-25 Mg Tabs (Lisinopril-hydrochlorothiazide) .... Take 1 tablet by mouth once a day 6)  Ventolin Hfa 108 (90 Base) Mcg/act Aers (Albuterol sulfate) .... 2 puffs four times a day as needed 7)  Viagra 100 Mg Tabs (Sildenafil citrate) .... 1/4/to 1/2 tab by mouth as needed prior to sexual activity 8)  Midrin 325-65-100 Mg Caps (Apap-isometheptene-dichloral) .Marland Kitchen.. 1-2 caps by mouth q 4 hours 9)  Singulair 10 Mg Tabs (Montelukast sodium) .... Take 1 by mouth once daily 10)  Tamiflu 75 Mg Caps (Oseltamivir phosphate) .... Take one tablet by mouth twice a day for five days 11)  Prednisone 20 Mg Tabs (Prednisone) .... 3 tabs by mouth daily x 3 days, then 2 tabs by mouth daily x 2 days then 1 tab by mouth daily x 2 days  Patient Instructions: 1)  Start prednisone taper and tamiflu. 2)  Go  to ER if severe SOB. 3)   USe albuterol inhaler for rescue.  4)  Follow up appt tommorow with Cartier Mapel.  Prescriptions: VENTOLIN HFA 108 (90 BASE) MCG/ACT  AERS (ALBUTEROL SULFATE) 2 puffs four times a day as needed  #1 x 3   Entered and Authorized by:   Kerby Nora MD   Signed by:   Kerby Nora MD on 06/25/2009   Method used:   Electronically to        [facility] [address] [address]* (retail)       [address] [place], [place]  [address]       Ph: [phone]       Fax: [phone]   RxID:   [id number] PREDNISONE 20 MG TABS (PREDNISONE) 3 tabs by mouth daily x 3 days, then 2 tabs by mouth daily x 2 days then 1 tab by mouth daily x 2 days  #15 x 0   Entered and Authorized by:   Kerby Nora MD   Signed by:   Kerby Nora MD on 06/25/2009   Method used:   Electronically to        [facility] [address] (retail)       [address] [place], [place]  [address]       Ph: [phone]       Fax: [phone]   RxID:   [id number] TAMIFLU 75 MG CAPS (OSELTAMIVIR PHOSPHATE)  take one tablet by mouth twice a day for five days  #10 x 0   Entered and Authorized by:   Kerby Nora MD   Signed by:   Kerby Nora MD on 06/25/2009   Method used:   Electronically to        [facility] [address]* (retail)       [address] [place], [place]  [address]       Ph: [phone]       Fax: [phone]   RxID:   [id number]   Current Allergies (reviewed today): ! * FLU VACCINATION      Medication Administration  Injection # 1:    Medication: Kenalog 10 mg inj    Diagnosis: ASTHMA, WITH ACUTE EXACERBATION [phone])    Route: IM    Site: R deltoid    Exp Date: 06/26/2010    Lot #: [id number]    Mfr: Bristol-Myers    Comments: 60 mg IM x 1     Patient tolerated injection without complications    Given by: Benny Lennert CMA Duncan Dull) (June 25, 2009 2:12 PM)  Medication # 1:    Medication: Albuterol Sulfate Sol 1mg  unit dose    Diagnosis: ASTHMA, WITH ACUTE EXACERBATION (RSW-546.27)    Dose: 5mg     Route: inhaled    Exp Date: 05/28/2010    Lot #: [id number]    Mfr: Rhone-Poulenec Rorer    Comments: 5 mg  inhaled x 1    Patient tolerated medication without complications    Given by: Benny Lennert CMA Duncan Dull) (June 25, 2009 12:37 PM)  Orders Added: 1)  Albuterol Sulfate Sol 1mg  unit dose [J7613] 2)  Nebulizer Tx [94640] 3)  Est. Patient Level IV [38182] 4)  Kenalog 10 mg inj [J3301] 5)  Admin of Therapeutic Inj  intramuscular or subcutaneous [96372]   Medication Administration  Injection # 1:    Medication: Kenalog 10 mg inj    Diagnosis: ASTHMA, WITH ACUTE EXACERBATION (NUU-725.36)    Route: IM    Site: R deltoid    Exp Date: 06/26/2010    Lot #: [id number]    Mfr: Bristol-Myers    Comments: 60 mg IM x 1     Patient tolerated injection without complications    Given by: Benny Lennert CMA Duncan Dull) (June 25, 2009 2:12 PM)  Medication # 1:    Medication: Albuterol Sulfate Sol 1mg  unit dose    Diagnosis:  ASTHMA, WITH ACUTE EXACERBATION (QQV-956.38)    Dose: 5mg     Route: inhaled    Exp Date: 05/28/2010    Lot #: [id number]    Mfr: Rhone-Poulenec Rorer    Comments: 5 mg  inhaled x 1    Patient tolerated medication without complications    Given by: Benny Lennert CMA Duncan Dull) (June 25, 2009 12:37 PM)  Orders Added: 1)  Albuterol Sulfate Sol 1mg  unit dose [J7613] 2)  Nebulizer Tx [94640] 3)  Est. Patient Level IV [29518] 4)  Kenalog 10 mg inj [J3301] 5)  Admin of Therapeutic Inj  intramuscular or subcutaneous [84166]

## 2010-05-27 NOTE — Progress Notes (Signed)
Summary: pt wants steroid injection  Phone Note Call from Patient Call back at [phone], [phone]   Caller: Patient Call For: Kerby Nora MD Summary of Call: Pt was seen in june for shoulder pain.  This eased up some with the medicine he was given but the pain is still there and pt wants to come in for injection.  Do you want to do this or schedule with Dr. Patsy Lager? Initial call taken by: Lowella Petties CMA,  November 27, 2009 9:38 AM  Follow-up for Phone Call        [place] Follow-up by: Kerby Nora MD,  November 27, 2009 9:53 AM  Additional Follow-up for Phone Call Additional follow up Details #1::        Appt scheduled, pt advised. Additional Follow-up by: Lowella Petties CMA,  November 27, 2009 11:56 AM

## 2010-05-27 NOTE — Progress Notes (Signed)
  Phone Note Call from Patient Call back at [phone] (ok to leave message)   Caller: Patient Summary of Call: Patient called and said his shoulder has started bothering him again. He said it has been hurting for the last 2 weeks. He rates pain as a 5/10 and says Advil helps with the pain a little. He has not tried ice or any other meds. He has to mop at night and feels that this may have aggravated things. He said the injection worked great for about a month. He requests a refill on the Diclofenac to the [facility] on [address] if he can try a course of that instead of coming in, but he said he will come in if he absolutely has to.  Initial call taken by: Janee Morn CMA Duncan Dull),  February 19, 2010 4:12 PM  Follow-up for Phone Call        Rx Called In, patient advised.Consuello Masse CMA   Follow-up by: Benny Lennert CMA Duncan Dull),  February 19, 2010 4:46 PM    Prescriptions: DICLOFENAC SODIUM 75 MG TBEC (DICLOFENAC SODIUM) 1 tab by mouth two times a day  #30 x 0   Entered and Authorized by:   Kerby Nora MD   Signed by:   Kerby Nora MD on 02/19/2010   Method used:   Electronically to        [facility] [address]* (retail)       [address] [place], [place]  [address]       Ph: [phone]       Fax: [phone]   RxID:   [id number]

## 2010-05-27 NOTE — Progress Notes (Signed)
Summary: sample of advair given  Phone Note Call from Patient   Caller: Patient Call For: Dr. Patsy Lager Summary of Call: Pt called requesting sample of advair.   One box of advair 100/50 given.  GSK lot # [id number], exp 10/2010.               Lowella Petties CMA  Sep 05, 2009 12:23 PM     C

## 2010-05-27 NOTE — Assessment & Plan Note (Signed)
Summary: 12 months/apc   Primary Provider/Referring Provider:  Ermalene Searing  CC:  Yearly follow up visit-sleep and asthma;Using CPAP and no complaints..  History of Present Illness: 09/23/07- This 55 year old, African-American male returns for follow-up of sleep apnea and asthma.  He says control with CPAP is good and he uses it every night.  Pressure is at 11 CWP.  During the spring pollen season he had chest tightness and nasal congestion.  He feels much better now, as peak  pollen season ends.  He denies cough.  We discussed his lisinopril, which he is tolerating well.  Singulair helps.  He needs a rescue inhaler for his asthma.  09/21/08- OSA, Asthma, Asthma  OSA- One year f/u. Has lost weight 269->248. Has tightened mask a little. He  hesistates with answers but insists he is using cpap and that pressure feels right. Says [facility] has not contacted him about mask replacement or updates.  Asthma/ rhinitis- dropped off singulair.P leased with Advair. Wears mask and goggles mowing. Will use rescue inhaler wih yard work and on humid days.  Sep 20, 2009- OSA, Asthma, rhinitis Asthma tiggered by grass mowing, humiditiy. Off Singulair, remains on Advair and feels stable. Using SABA up to 2x/ week, No waking from sleep. OSA- Comfortable with CPAP 11 all night every night.. Current mask getting older.   Asthma History    Asthma Control Assessment:    Age range: 12+ years    Symptoms: 0-2 days/week    Nighttime Awakenings: 0-2/month    Interferes w/ normal activity: no limitations    SABA use (not for EIB): 0-2 days/week    Asthma Control Assessment: Well Controlled   Preventive Screening-Counseling & Management  Alcohol-Tobacco     Smoking Status: never  Current Medications (verified): 1)  Advair Diskus 100-50 Mcg/dose  Misc (Fluticasone-Salmeterol) .Marland Kitchen.. 1 Inhalation Two Times A Day 2)  Cpap-11cm [facility] 3)  Aspirin 81 Mg  Tabs (Aspirin) .... Take 1 Tablet By Mouth  Once A Day 4)  Lisinopril-Hydrochlorothiazide 20-25 Mg  Tabs (Lisinopril-Hydrochlorothiazide) .... Take 1 Tablet By Mouth Once A Day 5)  Ventolin Hfa 108 (90 Base) Mcg/act  Aers (Albuterol Sulfate) .... 2 Puffs Four Times A Day As Needed 6)  Multivitamins  Tabs (Multiple Vitamin) .... Take 1 By Mouth Once Daily  Allergies (verified): 1)  ! * Flu Vaccination  Past History:  Past Medical History: Last updated: 09/23/2007 Asthma Hypertension Sleep Apnea  Past Surgical History: Last updated: 01/17/2007 Denies surgical history  Family History: Last updated: Sep 27, 2007 father died age 54 prostate cancer, HTN mother died age 20 ? 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    Vital Signs:  Patient profile:   55 year old male Height:      72 inches Weight:      250 pounds BMI:     34.03 O2 Sat:      100 % on Room air Pulse rate:   57 / minute BP sitting:   116 / 80  (left arm) Cuff size:   large  Vitals  Entered By: Reynaldo Minium CMA (Sep 20, 2009 9:33 AM)  O2 Flow:  Room air  Physical Exam  Additional Exam:  General: A/Ox3; pleasant and cooperative, NAD,  SKIN: no rash, lesions, no pressure marks on face NODES: no lymphadenopathy HEENT: [place]/AT, EOM- WNL, Conjuctivae- clear, PERRLA, TM-WNL, Nose- clear, Throat- clear and wnl,, tonsils present, Mallampati IV NECK: Supple w/ fair ROM, JVD- none, normal carotid impulses w/o bruits Thyroid- normal to palpation CHEST: clear, slight dry ocugh once HEART: RRR, no m/g/r heard ABDOMEN:  ZOX:WRUE, nl pulses, no edema  NEURO: Grossly intact to observation      Impression & Recommendations:  Problem # 1:  OBSTRUCTIVE SLEEP APNEA (ICD-327.23)  Good compliance and control. Better QOL with CPAP. Pressure is appropriate.  Problem # 2:  ASTHMA (ICD-493.90)  Mild intermittent. Environmental triggers discussed. May help to wear a mask mowing. Appropriate med use. Recommend annual flu vax in the Fall.  Orders: Est. Patient Level III (45409)  Medications Added to Medication List This Visit: 1)  Multivitamins Tabs (Multiple vitamin) .... Take 1 by mouth once daily  Patient Instructions: 1)  Please schedule a follow-up appointment in 1 year. 2)  Refill Advair x 1 year  Prescriptions: ADVAIR DISKUS 100-50 MCG/DOSE  MISC (FLUTICASONE-SALMETEROL) 1 inhalation two times a day  #1 x 11   Entered and Authorized by:   Waymon Budge MD   Signed by:   Waymon Budge MD on 09/20/2009   Method used:   Electronically to        [facility] [address] (retail)       [address] [place], [place]  [address]       Ph: [phone]       Fax: [phone]   RxID:   [id number]

## 2010-06-04 NOTE — Assessment & Plan Note (Signed)
Summary: CPX   Vital Signs:  Patient profile:   55 year old male Height:      72 inches Weight:      272.50 pounds BMI:     37.09 Temp:     98.1 degrees F oral Pulse rate:   64 / minute Pulse rhythm:   regular BP sitting:   110 / 80  (left arm) Cuff size:   large  Vitals Entered By: Benny Lennert CMA Duncan Dull) (May 27, 2010 2:45 PM)  History of Present Illness: Chief complaint cpx  The patient is here for annual wellness exam and preventative care.     HTN: well controlled on lisinopril/HCTZ. No problems at home.   High cholesterol:  Higher than last check.. LDL almost at goal <130. LDL was 141, tri and HDL good.  walkig n 3-4 times a wek, healthy eating ..moderate.  Has  gained 20 lbs in last 6 months.    Mild intermittant asthma: controlled with advair. proair as needed. No hospitalizations.intubations, minimal flares.    Sleep apnea: on CPAP.   Left rotator cuff syndrome: 11/2009 improved with steroid injection with Dr. Patsy Lager. Diclofenac helps  some, but comes back.  7-8/10 on pain scale Pain with abduction, int , ext rotaiton. No numbenss, no weakness in hand or arm. Went to one PT session, but could not afford. No reinjury/fall known. Not currently doing home PT at all.  Low back pain resolved.    Lipid Management History:      Positive NCEP/ATP III risk factors include male age 21 years old or older and hypertension.  Negative NCEP/ATP III risk factors include non-diabetic, no family history for ischemic heart disease, non-tobacco-user status, no ASHD (atherosclerotic heart disease), no prior stroke/TIA, and no peripheral vascular disease.        The patient states that he does not know about the "Therapeutic Lifestyle Change" diet.  His compliance with the TLC diet is fair.      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 1 Inhalation Two Times A Day 2)  Cpap-11cm [facility] 3)  Aspirin 81 Mg  Tabs (Aspirin) .... Take 1 Tablet By Mouth Once A Day 4)  Lisinopril-Hydrochlorothiazide 20-25 Mg  Tabs (Lisinopril-Hydrochlorothiazide) .... Take 1 Tablet By Mouth Once A Day 5)  Ventolin Hfa 108 (90 Base) Mcg/act  Aers (Albuterol Sulfate) .... 2 Puffs Four Times A Day As Needed 6)  Multivitamins  Tabs (Multiple Vitamin) .... Take 1 By Mouth Once Daily 7)  Diclofenac Sodium 75 Mg Tbec (Diclofenac Sodium) .Marland Kitchen.. 1 Tab By Mouth Two Times A Day  Allergies: 1)  ! * Flu Vaccination  Past History:  Past medical, surgical, family and social histories (including risk factors) reviewed, and no changes noted (except as noted below).  Past Medical History: Reviewed history from 09/23/2007 and no changes required. Asthma Hypertension Sleep Apnea  Past Surgical History: Reviewed history from 01/17/2007 and no changes required. Denies surgical  history  Family History: Reviewed history from 09/06/2007 and no changes required. father died age 80 prostate cancer, HTN mother died age 29 ? breast cancer brother HTN no MI < age 56 son with Grave's disease  Social History: Reviewed history from 09/20/2009 and no changes required. Occupation:custodian, yard work Married Never Smoked Alcohol use-no Drug use-no Regular exercise-yes, walks 1 mile day Diet: recent diet changes,  fuits and veggies, water, stopping soda  Review of  Systems General:  Denies fatigue and fever. CV:  Denies chest pain or discomfort. Resp:  Denies shortness of breath. GI:  Denies abdominal pain. GU:  Denies dysuria. Psych:  Denies anxiety and depression.  Physical Exam  General:  overweight muscular appearng male in NAD  Eyes:  No corneal or conjunctival inflammation noted. EOMI. Perrla. Funduscopic exam benign, without hemorrhages, exudates or papilledema. Vision grossly normal. Ears:  External ear exam shows no significant lesions or deformities.  Otoscopic examination reveals clear canals, tympanic membranes are intact bilaterally without bulging, retraction, inflammation or discharge. Hearing is grossly normal bilaterally. Nose:  External nasal examination shows no deformity or inflammation. Nasal mucosa are pink and moist without lesions or exudates. Mouth:  Oral mucosa and oropharynx without lesions or exudates.  Teeth in good repair. Neck:  no carotid bruit or thyromegaly no cervical or supraclavicular lymphadenopathy  Lungs:  Normal respiratory effort, chest expands symmetrically. Lungs are clear to auscultation, no crackles or wheezes. Heart:  Normal rate and regular rhythm. S1 and S2 normal without gallop, murmur, click, rub or other extra sounds. Abdomen:  Bowel sounds positive,abdomen soft and non-tender without masses, organomegaly or hernias noted. Rectal:  No external abnormalities noted. Normal sphincter tone. No rectal masses or tenderness. Genitalia:  Testes bilaterally descended without nodularity, tenderness or masses. No scrotal masses or lesions. No penis lesions or urethral discharge. Prostate:  Prostate gland firm and smooth, no enlargement, nodularity, tenderness, mass, asymmetry or induration. Msk:  ttp left subacromial.. positive Neer's , pos impingement sign.  Apin with active and passive int rotationa nd abduction neg drop arm test neg crossover test  Pulses:  R and L posterior tibial pulses are full and equal  bilaterally  Extremities:  no edema  Skin:  dry skin Psych:  Cognition and judgment appear intact. Alert and cooperative with normal attention span and concentration. No apparent delusions, illusions, hallucinations   Impression & Recommendations:  Problem # 1:  Preventive Health Care (ICD-V70.0) The patient's preventative maintenance and recommended screening tests for an annual wellness exam were reviewed in full today. Brought up to date unless services declined.  Counselled on the importance of diet, exercise, and its role in overall health and mortality. The patient's FH and SH was reviewed, including their home life, tobacco status, and drug and alcohol status.  PSA due in 08/2010    Problem # 2:  HYPERTENSION (ICD-401.9)  Well controlled. Continue current medication.  His updated medication list for this problem includes:    Lisinopril-hydrochlorothiazide 20-25 Mg Tabs (Lisinopril-hydrochlorothiazide) .Marland Kitchen... Take 1 tablet by mouth once a day  BP today: 110/80 Prior BP: 138/90 (01/03/2010)  10 Yr Risk Heart Disease: 7 % Prior 10 Yr Risk Heart Disease: 11 % (10/07/2007)  Labs Reviewed: K+: 4.1 (09/16/2009) Creat: : 1.0 (09/16/2009)   Chol: 204 (09/16/2009)   HDL: 50.20 (09/16/2009)   LDL: 134 (03/20/2009)   TG: 78.0 (09/16/2009)  Problem # 3:  ASTHMA, WITH ACUTE EXACERBATION (ICD-493.92) Stable His updated medication list for this problem includes:  Advair Diskus 100-50 Mcg/dose Misc (Fluticasone-salmeterol) .Marland Kitchen... 1 inhalation two times a day    Ventolin Hfa 108 (90 Base) Mcg/act Aers (Albuterol sulfate) .Marland Kitchen... 2 puffs four times a day as needed  Problem # 4:  HYPERLIPIDEMIA (ICD-272.4) Inadequate dcontrol. Info given adn reviewec again about exercsie and diet changes needed. Recheck lipids at next lab check in 4 months.  Problem # 5:  ROTATOR CUFF SYNDROME, LEFT (ICD-726.10) Moderate improvement with steroid injection.. never did PT. Could not afford  Recommend home  PT (info given again), NSAIDs... if not improving return for repeat steroid injection or further imaging.  Complete Medication List: 1)  Advair Diskus 100-50 Mcg/dose Misc (Fluticasone-salmeterol) .Marland Kitchen.. 1 inhalation two times a day 2)  Cpap-11cm [facility]  3)  Aspirin 81 Mg Tabs (Aspirin) .... Take 1 tablet by mouth once a day 4)  Lisinopril-hydrochlorothiazide 20-25 Mg Tabs (Lisinopril-hydrochlorothiazide) .... Take 1 tablet by mouth once a day 5)  Ventolin Hfa 108 (90 Base) Mcg/act Aers (Albuterol sulfate) .... 2 puffs four times a day as needed 6)  Multivitamins Tabs (Multiple vitamin) .... Take 1 by mouth once daily 7)  Diclofenac Sodium 75 Mg Tbec (Diclofenac sodium) .Marland Kitchen.. 1 tab by mouth two times a day  Lipid Assessment/Plan:      Based on NCEP/ATP III, the patient's risk factor category is "2 or more risk factors and a calculated 10 year CAD risk of < 20%".  The patient's lipid goals are as follows: Total cholesterol goal is 200; LDL cholesterol goal is 130; HDL cholesterol goal is 40; Triglyceride goal is 150.  His LDL cholesterol goal has not been met.    Patient Instructions: 1)  Due for repeat labs fasting  LIPIDs, CMET, PSA Dx 272.0, v76.44  in 08/2010 2)  Get back on track with home PT.  3)   Diclofenac as needed pain.  4)  MAk follow up with [facility] if shoulder pain continuing.  5)   Work on increase exercsie, weight loss and low cholesterol diet.    Orders Added: 1)  Est. Patient 40-64 years [99396] 2)  Est. Patient Level III [16109]    Current Allergies (reviewed today): ! * FLU VACCINATION  Last Flu Vaccine:  side effects after given  (02/26/2008 9:55:17 AM) Flu Vaccine Next Due:  Refused Hemoccult Next Due:  Not Indicated

## 2010-06-12 ENCOUNTER — Encounter: Payer: Self-pay | Admitting: Family Medicine

## 2010-06-24 ENCOUNTER — Telehealth: Payer: Self-pay | Admitting: Family Medicine

## 2010-07-03 ENCOUNTER — Encounter: Payer: Self-pay | Admitting: Family Medicine

## 2010-07-03 NOTE — Progress Notes (Signed)
Summary: wants refill on diclofenac  Phone Note Refill Request Call back at Home Phone [phone] Call back at [phone] Message from:  Patient  Refills Requested: Medication #1:  DICLOFENAC SODIUM 75 MG TBEC 1 tab by mouth two times a day. Phoned request from pt, uses [facility] in [place].  He doesnt have follow up appt with you scheduled.  He says his shoulder isnt any better and he is asking if he should be referred to ortho for possible surgery.  If he is referred he prefers to go to [facility]  Initial call taken by: Lowella Petties CMA, AAMA,  June 24, 2010 3:56 PM  Follow-up for Phone Call        Has had some improvement with steroid injection in past , but pain recurred... will refer to [facility].  Follow-up by: Kerby Nora MD,  June 24, 2010 5:15 PM  Additional Follow-up for Phone Call Additional follow up Details #1::        Patient agreeable to referral in [place].  Patient needed refill on medication for pain so only gave him 30 till he sees ortho.      Additional Follow-up by: Benny Lennert CMA (AAMA),  June 25, 2010 8:08 AM    New/Updated Medications: DICLOFENAC SODIUM 75 MG TBEC (DICLOFENAC SODIUM) 1 tab by mouth two times a day Prescriptions: DICLOFENAC SODIUM 75 MG TBEC (DICLOFENAC SODIUM) 1 tab by mouth two times a day  #30 x 0   Entered by:   Benny Lennert CMA (AAMA)   Authorized by:   Kerby Nora MD   Signed by:   Benny Lennert CMA (AAMA) on 06/25/2010   Method used:   Electronically to        [facility] [address] (retail)       [address] [place], [place]  [address]       Ph: [phone]       Fax: [phone]   RxID:   [id number]

## 2010-07-03 NOTE — Letter (Signed)
Summary: [facility]  [facility]   Imported By: Maryln Gottron 06/23/2010 15:01:08  _____________________________________________________________________  External Attachment:    Type:   Image     Comment:   External Document

## 2010-07-15 NOTE — Letter (Signed)
Summary: [facility]   Imported By: Kassie Mends 07/08/2010 09:25:27  _____________________________________________________________________  External Attachment:    Type:   Image     Comment:   External Document

## 2010-09-02 ENCOUNTER — Other Ambulatory Visit: Payer: Self-pay | Admitting: Family Medicine

## 2010-09-02 DIAGNOSIS — Z125 Encounter for screening for malignant neoplasm of prostate: Secondary | ICD-10-CM

## 2010-09-02 DIAGNOSIS — E7800 Pure hypercholesterolemia, unspecified: Secondary | ICD-10-CM

## 2010-09-04 ENCOUNTER — Other Ambulatory Visit: Payer: Self-pay

## 2010-09-12 NOTE — Assessment & Plan Note (Signed)
[facility]                             PULMONARY OFFICE NOTE   YOUSSEF, FOOTMAN                          MRN:          [id number]  DATE:04/23/2006                            DOB:          15-Sep-1955    PROBLEMS:  1. Obstructive sleep apnea.  2. Allergic rhinitis.  3. Asthma.   HISTORY:  He says he continues well at 1 year follow up using CPAP 11  CWP every night.  He is out of his asthma medicines again and I am not  sure that he has been able to maintain prescription follow up, but he  does not recognize himself as having frequent asthma problems.   MEDICATIONS:  1. Singulair 10 mg.  2. Advair 100/50.  3. CPAP 11 CWP.  4. Occasional use of albuterol.   NO MEDICATION ALLERGY.   OBJECTIVE:  Weight 281 pounds, blood pressure 118/80, pulse regular 62,  room air saturation 98%.  There is minimal basilar wheeze, unlabored.  HEART:  Sounds are regular and normal.  No neck vein distention nor cough.  No edema. His nasal airway is clear.  There are no pressure marks from CPAP mask.   IMPRESSION:  1. Asthma with suboptimal control.  2. Obstructive sleep apnea, does seem well controlled on continuous      positive airway pressure with 11 but he is cautioned against weight      gain.   PLAN:  1. Continue continuous positive airway pressure with 11 CWP.  2. Work to keep weight down.  3. Sample Advair 100/50 and continue 1 puff b.i.d.  4. Sample refill albuterol inhaler 2 puffs q.i.d. p.r.n. for rescue      use.  5. Schedule return 1 year, earlier p.r.n.     Clinton D. Maple Hudson, MD, Tonny Bollman, FACP  Electronically Signed    CDY/MedQ  DD: 04/24/2006  DT: 04/24/2006  Job #: [id number]

## 2010-09-12 NOTE — Procedures (Signed)
[name], Carlos                 ACCOUNT NO.:  [id number]   MEDICAL RECORD NO.:  [id number]          PATIENT TYPE:  OUT   LOCATION:  SLEEP CENTER                 FACILITY:  [facility]   PHYSICIAN:  Clinton D. Maple Hudson, M.D. DATE OF BIRTH:  06/25/1955   DATE OF STUDY:  07/24/2004                              NOCTURNAL POLYSOMNOGRAM   DATE OF STUDY:  July 24, 2004   REFERRING PHYSICIAN:  Dr. Jetty Duhamel   INDICATION FOR STUDY:  Hypersomnia with sleep apnea.  Epworth Sleepiness  Score 12/24, BMI 35, weight 260 pounds.   SLEEP ARCHITECTURE:  Total sleep time 413 minutes with sleep efficiency 96%.  Stage I was 1%, stage II 70%, stages III and IV were 1% and REM was 28% of  total sleep time.  Sleep latency 4.5 minutes, REM latency 7.5 minutes, awake  after sleep onset 14 minutes, arousal index increased at 31.  No sleep  medication was reported.   RESPIRATORY DATA:  Split-study protocol.  Respiratory disturbance index  (RDI) 99.3 obstructive events per hour before CPAP titration indicating  severe obstructive sleep apnea/hypopnea.  This included 182 obstructive  apneas and 24 hypopneas before CPAP.  Events were not positional.  REM RDI  7.3.  CPAP was titrated to 11 CWP, RDI 0 per hour using a large ComfortGel  mask with heated humidifier.   OXYGEN DATA:  Very loud snoring with oxygen desaturation to a nadir of 72%  before CPAP.  After CPAP control saturation held 95-98% on room air.   CARDIAC DATA:  Normal sinus rhythm and sinus bradycardia, 54-64 beats per  minute.   MOVEMENT/PARASOMNIA:  Occasional leg jerks.   IMPRESSION/RECOMMENDATION:  1.  Severe obstructive sleep apnea/hypopnea syndrome, respiratory      disturbance index 99.3 per hour with loud snoring and oxygen      desaturation to 72%.  2.  Successful continuous positive airway pressure titration to 11 CWP,      respiratory disturbance index 0 per hour, using a large ComfortGel mask      with heated humidifier.    CDY/MEDQ  D:  07/27/2004 13:45:27  T:  07/27/2004 22:09:34  Job:  [id number]

## 2010-09-15 ENCOUNTER — Encounter: Payer: Self-pay | Admitting: *Deleted

## 2010-09-19 ENCOUNTER — Encounter: Payer: Self-pay | Admitting: Internal Medicine

## 2010-09-19 ENCOUNTER — Ambulatory Visit: Payer: Self-pay | Admitting: Internal Medicine

## 2010-09-19 ENCOUNTER — Ambulatory Visit (INDEPENDENT_AMBULATORY_CARE_PROVIDER_SITE_OTHER): Payer: BC Managed Care – PPO | Admitting: Internal Medicine

## 2010-09-19 VITALS — BP 138/86 | HR 63 | Ht 72.0 in | Wt 247.4 lb

## 2010-09-19 DIAGNOSIS — G4733 Obstructive sleep apnea (adult) (pediatric): Secondary | ICD-10-CM

## 2010-09-19 DIAGNOSIS — J45909 Unspecified asthma, uncomplicated: Secondary | ICD-10-CM

## 2010-09-19 NOTE — Patient Instructions (Addendum)
[facility] to contact patient for replacement CPAP mask of choice and supplies.  Our goal is to use CPAP all night, every night  Please call as needed

## 2010-09-19 NOTE — Assessment & Plan Note (Signed)
I 've encouraged him to stay fully compliant with his CPAP which works but is a nuisance. We will contact [facility] about replacement mask and supplies- overdue by description. Pressure still 11.

## 2010-09-19 NOTE — Progress Notes (Signed)
  Subjective:    Patient ID: Carlos Day, male    DOB: [birth year], 55 y.o.   MRN: [id number]  HPI 09/19/10- 21 yoM followed for OSA, asthma. Last here- Sep 20, 2009- since then needed shoulder injected but no other major health issues. Denies bad asthma spells. Does note a little wheeze occ with rain. Continues Advair twice daily. Usually only needs rescue inhaler if cuts grass or muggy weather. Sees no problems with his lisinopril BP med.  CPAP 11 uses irregular- says he may miss a night per week. He can tell if he doesn't wear it. Has lost track of his DME company - [place]   Review of Systems Constitutional:   No weight loss, night sweats,  Fevers, chills, fatigue, lassitude. HEENT:   No headaches,  Difficulty swallowing,  Tooth/dental problems,  Sore throat,                No sneezing, itching, ear ache, nasal congestion, post nasal drip,   CV:  No chest pain,  Orthopnea, PND, swelling in lower extremities, anasarca, dizziness, palpitations  GI  No heartburn, indigestion, abdominal pain, nausea, vomiting, diarrhea, change in bowel habits, loss of appetite  Resp: No shortness of breath with exertion or at rest.  No excess mucus, no productive cough,  No non-productive cough,  No coughing up of blood.  No change in color of mucus.  No wheezing.  No chest wall deformity  Skin: no rash or lesions.  GU: no dysuria, change in color of urine, no urgency or frequency.  No flank pain.  MS:  No joint pain or swelling.  No decreased range of motion.  No back pain.  Psych:  No change in mood or affect. No depression or anxiety.  No memory loss.      Objective:   Physical Exam General- Alert, Oriented, Affect-appropriate, Distress- none acute  Skin- rash-none, lesions- none, excoriation- none  Lymphadenopathy- none  Head- atraumatic  Eyes- Gross vision intact, PERRLA, conjunctivae clear secretions  Ears- Hearing, canals, Tm- normal  Nose- Clear, No- Septal dev, mucus,  polyps, erosion, perforation   Throat- Mallampati III , mucosa clear , drainage- none, tonsils- atrophic  Neck- flexible , trachea midline, no stridor , thyroid nl, carotid no bruit  Chest - symmetrical excursion , unlabored     Heart/CV- RRR , no murmur , no gallop  , no rub, nl s1 s2                     - JVD- none , edema- none, stasis changes- none, varices- none     Lung- clear to P&A, wheeze- none, cough- none , dullness-none, rub- none     Chest wall-   Abd- tender-no, distended-no, bowel sounds-present, HSM- no  Br/ Gen/ Rectal- Not done, not indicated  Extrem- cyanosis- none, clubbing, none, atrophy- none, strength- nl  Neuro- grossly intact to observation         Assessment & Plan:

## 2010-09-19 NOTE — Assessment & Plan Note (Signed)
Meds seem adequate and effective. No changes needed.

## 2010-11-04 ENCOUNTER — Other Ambulatory Visit: Payer: Self-pay | Admitting: Family Medicine

## 2010-12-01 ENCOUNTER — Telehealth: Payer: Self-pay | Admitting: Internal Medicine

## 2010-12-01 MED ORDER — FLUTICASONE-SALMETEROL 100-50 MCG/DOSE IN AEPB: 1.0000 | INHALATION_SPRAY | Freq: Two times a day (BID) | RESPIRATORY_TRACT | Status: DC

## 2010-12-01 NOTE — Telephone Encounter (Signed)
Rx has been sent. Pt aware that RX sent to requested pharmacy.

## 2010-12-25 ENCOUNTER — Telehealth: Payer: Self-pay | Admitting: *Deleted

## 2010-12-25 NOTE — Telephone Encounter (Signed)
Pt is asking for copies of his 3/1 and 06/26/09 visits and also a copy of the out of work letter that he was given on 06/25/09. He needs these to give to his employer.  Copies placed up front, advised pt he will need to sign release form for these.

## 2011-05-01 ENCOUNTER — Telehealth: Payer: Self-pay | Admitting: Family Medicine

## 2011-05-01 NOTE — Telephone Encounter (Signed)
Spoke with patient and advised that we do have 1 sample.

## 2011-05-01 NOTE — Telephone Encounter (Signed)
Requesting Advair sample until refilled.  Please call

## 2011-06-29 ENCOUNTER — Other Ambulatory Visit: Payer: Self-pay | Admitting: Family Medicine

## 2011-07-09 ENCOUNTER — Other Ambulatory Visit: Payer: Self-pay | Admitting: *Deleted

## 2011-07-09 MED ORDER — LISINOPRIL-HYDROCHLOROTHIAZIDE 20-25 MG PO TABS: 1.0000 | ORAL_TABLET | Freq: Every day | ORAL | Status: DC

## 2011-07-09 NOTE — Telephone Encounter (Signed)
Addended by: Consuello Masse on: 07/09/2011 09:40 AM   Modules accepted: Orders

## 2011-08-28 ENCOUNTER — Ambulatory Visit (INDEPENDENT_AMBULATORY_CARE_PROVIDER_SITE_OTHER): Payer: BC Managed Care – PPO | Admitting: Internal Medicine

## 2011-08-28 ENCOUNTER — Encounter: Payer: Self-pay | Admitting: Internal Medicine

## 2011-08-28 VITALS — BP 118/70 | HR 61 | Ht 72.0 in | Wt 274.2 lb

## 2011-08-28 DIAGNOSIS — G4733 Obstructive sleep apnea (adult) (pediatric): Secondary | ICD-10-CM

## 2011-08-28 DIAGNOSIS — J45998 Other asthma: Secondary | ICD-10-CM

## 2011-08-28 DIAGNOSIS — J45909 Unspecified asthma, uncomplicated: Secondary | ICD-10-CM

## 2011-08-28 MED ORDER — FLUTICASONE-SALMETEROL 100-50 MCG/DOSE IN AEPB: 1.0000 | INHALATION_SPRAY | Freq: Two times a day (BID) | RESPIRATORY_TRACT | Status: DC

## 2011-08-28 MED ORDER — ALBUTEROL SULFATE HFA 108 (90 BASE) MCG/ACT IN AERS: 2.0000 | INHALATION_SPRAY | RESPIRATORY_TRACT | Status: DC | PRN

## 2011-08-28 NOTE — Progress Notes (Signed)
Subjective:    Patient ID: Carlos [facility], male    DOB: Oct 27, 1955, 56 y.o.   MRN: [id number]  HPI 09/19/10- 7 yoM followed for OSA, asthma. Last here- Sep 20, 2009- since then needed shoulder injected but no other major health issues. Denies bad asthma spells. Does note a little wheeze occ with rain. Continues Advair twice daily. Usually only needs rescue inhaler if cuts grass or muggy weather. Sees no problems with his lisinopril BP med.  CPAP 11 uses irregular- says he may miss a night per week. He can tell if he doesn't wear it. Has lost track of his DME company - [facility]  08/28/11- 55 yoM followed for OSA, asthma. Wears CPAP approx 5 hours at night; pressure okay on machine. Slight wheezing with increased pollen Needs Ventolin HFA RX for 90 days Comfortable with CPAP at 11/[facility]. He is convinced that helps Asthma control has been good with minor wheeze during spring pollen. Daily rescue inhaler once or twice.  ROS-see HPI Constitutional:   No-   weight loss, night sweats, fevers, chills, fatigue, lassitude. HEENT:   No-  headaches, difficulty swallowing, tooth/dental problems, sore throat,       No-  sneezing, itching, ear ache, nasal congestion, post nasal drip,  CV:  No-   chest pain, orthopnea, PND, swelling in lower extremities, anasarca, dizziness, palpitations Resp: No-   shortness of breath with exertion or at rest.              No-   productive cough,  No non-productive cough,  No- coughing up of blood.              No-   change in color of mucus.  + wheezing.   Skin: No-   rash or lesions. GI:  No-   heartburn, indigestion, abdominal pain, nausea, vomiting,  GU: . MS:  No-   joint pain or swelling. . Neuro-     nothing unusual Psych:  No- change in mood or affect. No depression or anxiety.  No memory loss.  OBJ- Physical Exam General- Alert, Oriented, Affect-appropriate, Distress- none acute Skin- rash-none, lesions- none, excoriation-  none Lymphadenopathy- none Head- atraumatic            Eyes- Gross vision intact, PERRLA, conjunctivae and secretions clear            Ears- Hearing, canals-normal            Nose- Clear, no-Septal dev, mucus, polyps, erosion, perforation             Throat- Mallampati II , mucosa clear , drainage- none, tonsils- atrophic Neck- flexible , trachea midline, no stridor , thyroid nl, carotid no bruit Chest - symmetrical excursion , unlabored           Heart/CV- RRR , no murmur , no gallop  , no rub, nl s1 s2                           - JVD- none , edema- none, stasis changes- none, varices- none           Lung- clear to P&A, wheeze- none, cough- none , dullness-none, rub- none           Chest wall-  Abd- tender-no, distended-no, bowel sounds-present, HSM- no Br/ Gen/ Rectal- Not done, not indicated Extrem- cyanosis- none, clubbing, none, atrophy- none, strength- nl Neuro- grossly intact to observation

## 2011-08-28 NOTE — Patient Instructions (Signed)
Refill scripts sent for Advair and for rescue inhaler  Continue CPAP 11  Please call if we can help

## 2011-08-31 ENCOUNTER — Encounter: Payer: Self-pay | Admitting: Internal Medicine

## 2011-08-31 NOTE — Assessment & Plan Note (Signed)
Good control with very occasional use of rescue inhaler which we will refill.

## 2011-08-31 NOTE — Assessment & Plan Note (Signed)
Good compliance and control. Weight loss would help.  

## 2011-10-25 ENCOUNTER — Other Ambulatory Visit: Payer: Self-pay | Admitting: Family Medicine

## 2011-12-08 ENCOUNTER — Ambulatory Visit (INDEPENDENT_AMBULATORY_CARE_PROVIDER_SITE_OTHER): Payer: BC Managed Care – PPO | Admitting: Family Medicine

## 2011-12-08 ENCOUNTER — Encounter: Payer: Self-pay | Admitting: Family Medicine

## 2011-12-08 VITALS — BP 118/80 | HR 56 | Temp 98.0°F | Ht 72.0 in | Wt 272.2 lb

## 2011-12-08 DIAGNOSIS — J4540 Moderate persistent asthma, uncomplicated: Secondary | ICD-10-CM

## 2011-12-08 DIAGNOSIS — E785 Hyperlipidemia, unspecified: Secondary | ICD-10-CM

## 2011-12-08 DIAGNOSIS — J45909 Unspecified asthma, uncomplicated: Secondary | ICD-10-CM

## 2011-12-08 DIAGNOSIS — I10 Essential (primary) hypertension: Secondary | ICD-10-CM

## 2011-12-08 LAB — LIPID PANEL
Cholesterol: 194 mg/dL (ref 0–200)
HDL: 48.2 mg/dL (ref >39.00)
LDL Cholesterol: 130 mg/dL — ABNORMAL HIGH (ref 0–99)
Total CHOL/HDL Ratio: 4
Triglycerides: 78 mg/dL (ref 0.0–149.0)
VLDL: 15.6 mg/dL (ref 0.0–40.0)

## 2011-12-08 LAB — COMPREHENSIVE METABOLIC PANEL
ALT: 59 U/L — ABNORMAL HIGH (ref 0–53)
AST: 50 U/L — ABNORMAL HIGH (ref 0–37)
Albumin: 4.3 g/dL (ref 3.5–5.2)
Alkaline Phosphatase: 51 U/L (ref 39–117)
BUN: 14 mg/dL (ref 6–23)
CO2: 25 mEq/L (ref 19–32)
Calcium: 9.4 mg/dL (ref 8.4–10.5)
Chloride: 100 mEq/L (ref 96–112)
Creatinine, Ser: 0.9 mg/dL (ref 0.4–1.5)
GFR: 92.98 mL/min (ref >60.00)
Glucose, Bld: 99 mg/dL (ref 70–99)
Potassium: 3.7 mEq/L (ref 3.5–5.1)
Sodium: 134 mEq/L — ABNORMAL LOW (ref 135–145)
Total Bilirubin: 0.7 mg/dL (ref 0.3–1.2)
Total Protein: 7.3 g/dL (ref 6.0–8.3)

## 2011-12-08 MED ORDER — LISINOPRIL-HYDROCHLOROTHIAZIDE 20-25 MG PO TABS: 1.0000 | ORAL_TABLET | Freq: Every day | ORAL | Status: DC

## 2011-12-08 NOTE — Assessment & Plan Note (Signed)
Followed by Pulmonology. Well controlled when on advair.

## 2011-12-08 NOTE — Progress Notes (Signed)
  Subjective:    Patient ID: Carlos Day, male    DOB: 1955/07/03, 56 y.o.   MRN: [id number]  HPI  56 year old male not seen in over a year presents for follow up.   Hypertension: Well controlled on lisinopril/HCTZ.   Using medication without problems or lightheadedness: None Chest pain with exertion:None Edema:None Short of breath:Some SOB.. Out of advair and humidity causing asthma flares increased. Average home BPs: Checking at home every now and them.  Other issues:  Elevated Cholesterol: Due for re-eval. Lab Results  Component Value Date   CHOL 204* 09/16/2009   HDL 50.20 09/16/2009   LDLCALC 134* 03/20/2009   LDLDIRECT 141.7 09/16/2009   TRIG 78.0 09/16/2009   CHOLHDL 4 09/16/2009  Diet compliance: Moderate, has gained some weight back. Decreasing sodas. Exercise:None Other complaints:   Review of Systems  Constitutional: Negative for fever and fatigue.  HENT: Negative for congestion.   Eyes: Negative for pain.  Respiratory: Negative for cough and wheezing.   Cardiovascular: Negative for chest pain, palpitations and leg swelling.  Gastrointestinal: Negative for abdominal pain.       Objective:   Physical Exam  Constitutional: Vital signs are normal. He appears well-developed and well-nourished.       overweight  HENT:  Head: Normocephalic.  Right Ear: Hearing normal.  Left Ear: Hearing normal.  Nose: Nose normal.  Mouth/Throat: Oropharynx is clear and moist and mucous membranes are normal.  Neck: Trachea normal. Carotid bruit is not present. No mass and no thyromegaly present.  Cardiovascular: Normal rate, regular rhythm and normal pulses.  Exam reveals no gallop, no distant heart sounds and no friction rub.   No murmur heard.      No peripheral edema  Pulmonary/Chest: Effort normal and breath sounds normal. No respiratory distress.  Skin: Skin is warm, dry and intact. No rash noted.  Psychiatric: He has a normal mood and affect. His speech is normal and  behavior is normal. Thought content normal.          Assessment & Plan:

## 2011-12-08 NOTE — Assessment & Plan Note (Signed)
Due for re-eval. Encouraged exercise, weight loss, healthy eating habits.  

## 2011-12-08 NOTE — Patient Instructions (Addendum)
Increase exercise as discussed.  Work on healthy eating and weight loss.

## 2011-12-08 NOTE — Assessment & Plan Note (Signed)
Well controlled. Continue current medication.  

## 2011-12-09 ENCOUNTER — Other Ambulatory Visit: Payer: BC Managed Care – PPO

## 2011-12-10 ENCOUNTER — Other Ambulatory Visit: Payer: Self-pay | Admitting: Family Medicine

## 2011-12-10 DIAGNOSIS — R748 Abnormal levels of other serum enzymes: Secondary | ICD-10-CM

## 2011-12-16 ENCOUNTER — Other Ambulatory Visit (INDEPENDENT_AMBULATORY_CARE_PROVIDER_SITE_OTHER): Payer: BC Managed Care – PPO

## 2011-12-16 DIAGNOSIS — R748 Abnormal levels of other serum enzymes: Secondary | ICD-10-CM

## 2011-12-16 LAB — HEPATIC FUNCTION PANEL
ALT: 45 U/L (ref 0–53)
AST: 29 U/L (ref 0–37)
Albumin: 4.1 g/dL (ref 3.5–5.2)
Alkaline Phosphatase: 54 U/L (ref 39–117)
Bilirubin, Direct: 0.1 mg/dL (ref 0.0–0.3)
Total Bilirubin: 0.7 mg/dL (ref 0.3–1.2)
Total Protein: 7.1 g/dL (ref 6.0–8.3)

## 2011-12-17 LAB — HEPATITIS PANEL, ACUTE
HCV Ab: NEGATIVE
Hep A IgM: NEGATIVE
Hep B C IgM: NEGATIVE
Hepatitis B Surface Ag: NEGATIVE

## 2012-01-29 ENCOUNTER — Encounter: Payer: Self-pay | Admitting: Gastroenterology

## 2012-05-06 ENCOUNTER — Telehealth: Payer: Self-pay

## 2012-05-06 NOTE — Telephone Encounter (Signed)
Okay to given sampls if we have any as requested.

## 2012-05-06 NOTE — Telephone Encounter (Signed)
Pt left v/m requesting samples for advair.Please advise.

## 2012-05-06 NOTE — Telephone Encounter (Signed)
Patient advised.

## 2012-09-03 ENCOUNTER — Other Ambulatory Visit: Payer: Self-pay | Admitting: Internal Medicine

## 2012-09-08 ENCOUNTER — Ambulatory Visit: Payer: BC Managed Care – PPO | Admitting: Internal Medicine

## 2012-09-16 ENCOUNTER — Ambulatory Visit: Payer: BC Managed Care – PPO | Admitting: Internal Medicine

## 2012-09-30 ENCOUNTER — Encounter: Payer: Self-pay | Admitting: Family Medicine

## 2012-09-30 ENCOUNTER — Ambulatory Visit (INDEPENDENT_AMBULATORY_CARE_PROVIDER_SITE_OTHER): Payer: BC Managed Care – PPO | Admitting: Family Medicine

## 2012-09-30 VITALS — BP 120/84 | HR 62 | Temp 98.3°F | Ht 72.0 in | Wt 262.0 lb

## 2012-09-30 DIAGNOSIS — I10 Essential (primary) hypertension: Secondary | ICD-10-CM

## 2012-09-30 DIAGNOSIS — Z0000 Encounter for general adult medical examination without abnormal findings: Secondary | ICD-10-CM

## 2012-09-30 DIAGNOSIS — Z86010 Personal history of colonic polyps: Secondary | ICD-10-CM

## 2012-09-30 DIAGNOSIS — J4540 Moderate persistent asthma, uncomplicated: Secondary | ICD-10-CM

## 2012-09-30 DIAGNOSIS — J45909 Unspecified asthma, uncomplicated: Secondary | ICD-10-CM

## 2012-09-30 DIAGNOSIS — E785 Hyperlipidemia, unspecified: Secondary | ICD-10-CM

## 2012-09-30 DIAGNOSIS — Z125 Encounter for screening for malignant neoplasm of prostate: Secondary | ICD-10-CM

## 2012-09-30 NOTE — Assessment & Plan Note (Signed)
Well controlled. Continue current medication.  

## 2012-09-30 NOTE — Assessment & Plan Note (Signed)
Due for re-eval. 

## 2012-09-30 NOTE — Assessment & Plan Note (Signed)
Stable control. 

## 2012-09-30 NOTE — Patient Instructions (Addendum)
Return for fasting labs in 2 months. Decrease carbohydrates in diet. Increase exercise as able.  Stop at front desk for referral to for colonoscopy. Return for CPX in 1 year.

## 2012-09-30 NOTE — Progress Notes (Signed)
Subjective:    Patient ID: Carlos Day, male    DOB: [birth year], 57 y.o.   MRN: [id number]  HPI  The patient is here for annual wellness exam and preventative care.   HTN: well controlled on lisinopril/HCTZ.  Using medication without problems or lightheadedness: None Chest pain with exertion:None Edema:None Short of breath:none Average home BPs:not checking Other issues:   High cholesterol:  Due for re-eval. Lab Results  Component Value Date   CHOL 194 12/08/2011   HDL 48.20 12/08/2011   LDLCALC 130* 12/08/2011   LDLDIRECT 141.7 09/16/2009   TRIG 78.0 12/08/2011   CHOLHDL 4 12/08/2011   LDL  goal <130. L Walking 3-4 times a wek, healthy eating ..moderate, he is trying to lose weight. Wt Readings from Last 3 Encounters:  09/30/12 262 lb (118.842 kg)  12/08/11 272 lb 4 oz (123.492 kg)  08/28/11 274 lb 3.2 oz (124.376 kg)   Reviewed diet in detail. Drinking green tea diet.  Mild intermittant asthma: Controlled with advair. Proair as needed. No hospitalizations.intubations, minimal flares.   Sleep apnea: on CPAP.    Review of Systems  Constitutional: Negative for fever, fatigue and unexpected weight change.  HENT: Negative for ear pain, congestion, sore throat, rhinorrhea, trouble swallowing and postnasal drip.   Eyes: Negative for pain.  Respiratory: Negative for cough, shortness of breath and wheezing.   Cardiovascular: Negative for chest pain, palpitations and leg swelling.  Gastrointestinal: Negative for nausea, abdominal pain, diarrhea, constipation and blood in stool.  Genitourinary: Negative for dysuria, urgency, hematuria, discharge, penile swelling, scrotal swelling, difficulty urinating, penile pain and testicular pain.  Skin: Negative for rash.  Neurological: Positive for headaches. Negative for syncope, weakness, light-headedness and numbness.       Occ momentary sharp pains in head  Psychiatric/Behavioral: Negative for behavioral problems and dysphoric mood. The  patient is not nervous/anxious.        Objective:   Physical Exam  Constitutional: He appears well-developed and well-nourished.  Non-toxic appearance. He does not appear ill. No distress.  Overweight.  HENT:  Head: Normocephalic and atraumatic.  Right Ear: Hearing, tympanic membrane, external ear and ear canal normal.  Left Ear: Hearing, tympanic membrane, external ear and ear canal normal.  Nose: Nose normal.  Mouth/Throat: Uvula is midline, oropharynx is clear and moist and mucous membranes are normal.  Eyes: Conjunctivae, EOM and lids are normal. Pupils are equal, round, and reactive to light. No foreign bodies found.  Neck: Trachea normal, normal range of motion and phonation normal. Neck supple. Carotid bruit is not present. No mass and no thyromegaly present.  Cardiovascular: Normal rate, regular rhythm, S1 normal, S2 normal, intact distal pulses and normal pulses.  Exam reveals no gallop.   No murmur heard. Pulmonary/Chest: Breath sounds normal. He has no wheezes. He has no rhonchi. He has no rales.  Abdominal: Soft. Normal appearance and bowel sounds are normal. There is no hepatosplenomegaly. There is no tenderness. There is no rebound, no guarding and no CVA tenderness. No hernia. Hernia confirmed negative in the right inguinal area and confirmed negative in the left inguinal area.  Genitourinary: Prostate normal, testes normal and penis normal. Rectal exam shows no external hemorrhoid, no internal hemorrhoid, no fissure, no mass, no tenderness and anal tone normal. Guaiac negative stool. Prostate is not enlarged and not tender. Right testis shows no mass and no tenderness. Left testis shows no mass and no tenderness. No paraphimosis or penile tenderness.  Lymphadenopathy:    He has no  cervical adenopathy.       Right: No inguinal adenopathy present.       Left: No inguinal adenopathy present.  Neurological: He is alert. He has normal strength and normal reflexes. No cranial nerve  deficit or sensory deficit. Gait normal.  Skin: Skin is warm, dry and intact. No rash noted.  Psychiatric: He has a normal mood and affect. His speech is normal and behavior is normal. Judgment normal.          Assessment & Plan:  The patient's preventative maintenance and recommended screening tests for an annual wellness exam were reviewed in full today. Brought up to date unless services declined.  Counselled on the importance of diet, exercise, and its role in overall health and mortality. The patient's FH and SH was reviewed, including their home life, tobacco status, and drug and alcohol status.   Colon: no family colon cancer. LAst colon in 2008, Dr. Larae Grooms, repeat was due 2013. Referred to GI. Lab Results  Component Value Date   PSA 0.63 09/16/2009   PSA 0.43 06/22/2008   PSA 0.43 06/15/2008   nonsmoker Vaccine: uptodate Td

## 2012-10-03 ENCOUNTER — Encounter: Payer: Self-pay | Admitting: Gastroenterology

## 2012-10-25 ENCOUNTER — Encounter: Payer: Self-pay | Admitting: Internal Medicine

## 2012-10-25 ENCOUNTER — Ambulatory Visit (INDEPENDENT_AMBULATORY_CARE_PROVIDER_SITE_OTHER): Payer: BC Managed Care – PPO | Admitting: Internal Medicine

## 2012-10-25 VITALS — BP 124/84 | HR 65 | Ht 70.0 in | Wt 270.2 lb

## 2012-10-25 DIAGNOSIS — G4733 Obstructive sleep apnea (adult) (pediatric): Secondary | ICD-10-CM

## 2012-10-25 MED ORDER — ALBUTEROL SULFATE HFA 108 (90 BASE) MCG/ACT IN AERS: INHALATION_SPRAY | RESPIRATORY_TRACT | Status: DC

## 2012-10-25 MED ORDER — FLUTICASONE-SALMETEROL 100-50 MCG/DOSE IN AEPB: INHALATION_SPRAY | RESPIRATORY_TRACT | Status: DC

## 2012-10-25 NOTE — Patient Instructions (Addendum)
We can continue CPAP 11/ [facility]  Refill scripts for Ventolin and Advair inhalers  Please call us as needed

## 2012-10-25 NOTE — Progress Notes (Signed)
Subjective:    Patient ID: Carlos Day, male    DOB: [birth year], 57 y.o.   MRN: [id number]  HPI 09/19/10- 11 yoM followed for OSA, asthma. Last here- Sep 20, 2009- since then needed shoulder injected but no other major health issues. Denies bad asthma spells. Does note a little wheeze occ with rain. Continues Advair twice daily. Usually only needs rescue inhaler if cuts grass or muggy weather. Sees no problems with his lisinopril BP med.  CPAP 11 uses irregular- says he may miss a night per week. He can tell if he doesn't wear it. Has lost track of his DME company - [facility]  08/28/11- 55 yoM followed for OSA, asthma. Wears CPAP approx 5 hours at night; pressure okay on machine. Slight wheezing with increased pollen Needs Ventolin HFA RX for 90 days Comfortable with CPAP at 11/[facility]. He is convinced that helps Asthma control has been good with minor wheeze during spring pollen. Daily rescue inhaler once or twice.  10/25/12- 57 yoM followed for OSA, asthma. follows for:  wears CPAP 11/ [facility] approx 4-5 hours at night.  finds that he takes the mask off while he is sleeping.  he thinks that the mask fit is ok.   Only occasional rescue inhaler.  ROS-see HPI Constitutional:   No-   weight loss, night sweats, fevers, chills, fatigue, lassitude. HEENT:   No-  headaches, difficulty swallowing, tooth/dental problems, sore throat,       No-  sneezing, itching, ear ache, nasal congestion, post nasal drip,  CV:  No-   chest pain, orthopnea, PND, swelling in lower extremities, anasarca, dizziness, palpitations Resp: No-   shortness of breath with exertion or at rest.              No-   productive cough,  No non-productive cough,  No- coughing up of blood.              No-   change in color of mucus.  +little wheezing.   Skin: No-   rash or lesions. GI:  No-   heartburn, indigestion, abdominal pain, nausea, vomiting,  GU: . MS:  No-   joint pain or swelling.  . Neuro-     nothing unusual Psych:  No- change in mood or affect. No depression or anxiety.  No memory loss.  OBJ- Physical Exam General- Alert, Oriented, Affect-appropriate, Distress- none acute Skin- rash-none, lesions- none, excoriation- none Lymphadenopathy- none Head- atraumatic            Eyes- Gross vision intact, PERRLA, conjunctivae and secretions clear            Ears- Hearing, canals-normal            Nose- Clear, no-Septal dev, mucus, polyps, erosion, perforation             Throat- Mallampati II , mucosa clear , drainage- none, tonsils- atrophic Neck- flexible , trachea midline, no stridor , thyroid nl, carotid no bruit Chest - symmetrical excursion , unlabored           Heart/CV- RRR , no murmur , no gallop  , no rub, nl s1 s2                           - JVD- none , edema- none, stasis changes- none, varices- none           Lung- clear to P&A, wheeze- none, cough- none , dullness-none,  rub- none           Chest wall-  Abd-  Br/ Gen/ Rectal- Not done, not indicated Extrem- cyanosis- none, clubbing, none, atrophy- none, strength- nl Neuro- grossly intact to observation

## 2012-11-07 NOTE — Assessment & Plan Note (Signed)
Good compliance and control said he occasionally takes mask off in his sleep. We discussed comfort issues. He is encouraged to keep his weight down

## 2012-11-29 ENCOUNTER — Ambulatory Visit (AMBULATORY_SURGERY_CENTER): Payer: BC Managed Care – PPO

## 2012-11-29 VITALS — Ht 70.0 in | Wt 267.6 lb

## 2012-11-29 DIAGNOSIS — Z86010 Personal history of colonic polyps: Secondary | ICD-10-CM

## 2012-11-29 MED ORDER — MOVIPREP 100 G PO SOLR: ORAL | Status: DC

## 2012-11-30 ENCOUNTER — Encounter: Payer: Self-pay | Admitting: Gastroenterology

## 2012-12-13 ENCOUNTER — Ambulatory Visit (AMBULATORY_SURGERY_CENTER): Payer: BC Managed Care – PPO | Admitting: Gastroenterology

## 2012-12-13 ENCOUNTER — Encounter: Payer: Self-pay | Admitting: Gastroenterology

## 2012-12-13 VITALS — BP 112/56 | HR 55 | Temp 97.1°F | Resp 23 | Ht 70.0 in | Wt 267.0 lb

## 2012-12-13 DIAGNOSIS — D126 Benign neoplasm of colon, unspecified: Secondary | ICD-10-CM

## 2012-12-13 DIAGNOSIS — Z86010 Personal history of colonic polyps: Secondary | ICD-10-CM

## 2012-12-13 MED ORDER — SODIUM CHLORIDE 0.9 % IV SOLN: 500.0000 mL | INTRAVENOUS | Status: DC

## 2012-12-13 NOTE — Patient Instructions (Addendum)
YOU HAD AN ENDOSCOPIC PROCEDURE TODAY AT THE [facility]: Refer to the procedure report that was given to you for any specific questions about what was found during the examination.  If the procedure report does not answer your questions, please call your gastroenterologist to clarify.  If you requested that your care partner not be given the details of your procedure findings, then the procedure report has been included in a sealed envelope for you to review at your convenience later.  YOU SHOULD EXPECT: Some feelings of bloating in the abdomen. Passage of more gas than usual.  Walking can help get rid of the air that was put into your GI tract during the procedure and reduce the bloating. If you had a lower endoscopy (such as a colonoscopy or flexible sigmoidoscopy) you may notice spotting of blood in your stool or on the toilet paper. If you underwent a bowel prep for your procedure, then you may not have a normal bowel movement for a few days.  DIET: Your first meal following the procedure should be a light meal and then it is ok to progress to your normal diet.  A half-sandwich or bowl of soup is an example of a good first meal.  Heavy or fried foods are harder to digest and may make you feel nauseous or bloated.  Likewise meals heavy in dairy and vegetables can cause extra gas to form and this can also increase the bloating.  Drink plenty of fluids but you should avoid alcoholic beverages for 24 hours.  ACTIVITY: Your care partner should take you home directly after the procedure.  You should plan to take it easy, moving slowly for the rest of the day.  You can resume normal activity the day after the procedure however you should NOT DRIVE or use heavy machinery for 24 hours (because of the sedation medicines used during the test).    SYMPTOMS TO REPORT IMMEDIATELY: A gastroenterologist can be reached at any hour.  During normal business hours, 8:30 AM to 5:00 PM Monday through Friday,  call [phone].  After hours and on weekends, please call the GI answering service at [phone] who will take a message and have the physician on call contact you.   Following lower endoscopy (colonoscopy or flexible sigmoidoscopy):  Excessive amounts of blood in the stool  Significant tenderness or worsening of abdominal pains  Swelling of the abdomen that is new, acute  Fever of 100F or higher  FOLLOW UP: If any biopsies were taken you will be contacted by phone or by letter within the next 1-3 weeks.  Call your gastroenterologist if you have not heard about the biopsies in 3 weeks.  Our staff will call the home number listed on your records the next business day following your procedure to check on you and address any questions or concerns that you may have at that time regarding the information given to you following your procedure. This is a courtesy call and so if there is no answer at the home number and we have not heard from you through the emergency physician on call, we will assume that you have returned to your regular daily activities without incident.  SIGNATURES/CONFIDENTIALITY: You and/or your care partner have signed paperwork which will be entered into your electronic medical record.  These signatures attest to the fact that that the information above on your After Visit Summary has been reviewed and is understood.  Full responsibility of the confidentiality of this   discharge information lies with you and/or your care-partner.  Handout on polyps  

## 2012-12-13 NOTE — Progress Notes (Signed)
The pt tolerated the colonoscopy very well. Maw   

## 2012-12-13 NOTE — Op Note (Signed)
[facility] [address] [place], [address]   COLONOSCOPY PROCEDURE REPORT  PATIENT: Carlos Day, Carlos Day  MR#: [id number] BIRTHDATE: 1955-10-23 , 57  yrs. old GENDER: Male ENDOSCOPIST: Rachael Fee, MD PROCEDURE DATE:  12/13/2012 PROCEDURE:   Colonoscopy with snare polypectomy First Screening Colonoscopy - Avg.  risk and is 50 yrs.  old or older - No.  Prior Negative Screening - Now for repeat screening. N/A  History of Adenoma - Now for follow-up colonoscopy & has been > or = to 3 yrs.  Yes hx of adenoma.  Has been 3 or more years since last colonoscopy.  Polyps Removed Today? Yes. ASA CLASS:   Class II INDICATIONS:Two small polyps removed 2008, one was adenomatous. MEDICATIONS: Fentanyl 50 mcg IV, Versed 5 mg IV, and These medications were titrated to patient response per physician's verbal order  DESCRIPTION OF PROCEDURE:   After the risks benefits and alternatives of the procedure were thoroughly explained, informed consent was obtained.  A digital rectal exam revealed no abnormalities of the rectum.   The LB CF-H180AL Loaner [id number] endoscope was introduced through the anus and advanced to the cecum, which was identified by both the appendix and ileocecal valve. No adverse events experienced.   The quality of the prep was poor, using MoviPrep  The instrument was then slowly withdrawn as the colon was fully examined.   COLON FINDINGS: Three small sessile polyps were found, ranging in size from 1mm to 2mm, located in transverse and descending segments.  They all appeared hyperplastic.  They were all removed with cold snare, one was retrieved and sent to pathology.  The examination was otherwise normal.  Retroflexed views revealed no abnormalities. The time to cecum=2 minutes 51 seconds.  Withdrawal time=10 minutes 57 seconds.  The scope was withdrawn and the procedure completed. COMPLICATIONS: There were no complications.  ENDOSCOPIC IMPRESSION: Three  small sessile polyps were found, ranging in size from 1mm to 2mm, located in transverse and descending segments.  They were all removed with cold snare, one was retrieved and sent to pathology. The examination was otherwise normal.  RECOMMENDATIONS: If the polyp(s) removed today are proven to be adenomatous (pre-cancerous) polyps, you will need a repeat colonoscopy in 5 years.  Otherwise you should continue to follow colorectal cancer screening guidelines for "routine risk" patients with colonoscopy in 10 years.  You will receive a letter within 1-2 weeks with the results of your biopsy as well as final recommendations.  Please call my office if you have not received a letter after 3 weeks.   eSigned:  Rachael Fee, MD 12/13/2012 9:50 AM   cc: Kerby Nora, MD

## 2012-12-13 NOTE — Progress Notes (Signed)
Patient did not experience any of the following events: a burn prior to discharge; a fall within the facility; wrong site/side/patient/procedure/implant event; or a hospital transfer or hospital admission upon discharge from the facility. (G8907)Patient did not have preoperative order for IV antibiotic SSI prophylaxis. (G8918) ewm 

## 2012-12-14 ENCOUNTER — Telehealth: Payer: Self-pay

## 2012-12-14 NOTE — Telephone Encounter (Signed)
  Follow up Call-  Call back number 12/13/2012  Post procedure Call Back phone  # [phone]  Permission to leave phone message Yes     Patient questions:  Do you have a fever, pain , or abdominal swelling? no Pain Score  0 *  Have you tolerated food without any problems? yes  Have you been able to return to your normal activities? yes  Do you have any questions about your discharge instructions: Diet   no Medications  no Follow up visit  no  Do you have questions or concerns about your Care? no  Actions: * If pain score is 4 or above: No action needed, pain <4.

## 2012-12-21 ENCOUNTER — Other Ambulatory Visit: Payer: Self-pay | Admitting: Family Medicine

## 2012-12-22 ENCOUNTER — Encounter: Payer: Self-pay | Admitting: Gastroenterology

## 2013-01-31 ENCOUNTER — Telehealth: Payer: Self-pay

## 2013-01-31 NOTE — Telephone Encounter (Signed)
Pt left v/m requesting advair 100-50 samples. Pt request cb.

## 2013-01-31 NOTE — Telephone Encounter (Signed)
Okay to provide if we have.

## 2013-02-01 MED ORDER — FLUTICASONE-SALMETEROL 100-50 MCG/DOSE IN AEPB: INHALATION_SPRAY | RESPIRATORY_TRACT | Status: DC

## 2013-02-01 NOTE — Telephone Encounter (Signed)
Left message for [facility] to come by office and pick up samples.

## 2013-02-01 NOTE — Addendum Note (Signed)
Addended by: Damita Lack on: 02/01/2013 11:22 AM   Modules accepted: Orders

## 2013-02-23 ENCOUNTER — Telehealth: Payer: Self-pay | Admitting: Internal Medicine

## 2013-02-23 NOTE — Telephone Encounter (Signed)
Pt advised no samples at this time. Carron Curie, CMA

## 2013-02-27 ENCOUNTER — Telehealth: Payer: Self-pay | Admitting: Internal Medicine

## 2013-02-27 DIAGNOSIS — G4733 Obstructive sleep apnea (adult) (pediatric): Secondary | ICD-10-CM

## 2013-02-27 NOTE — Telephone Encounter (Signed)
I spoke with pt. He reports he is not contacted [employer]. He reports he hasn't heard from them in a while. He reports he does not feel like the machine in putting out the correct amount of air. Liek the machine may be messing up. i advised pt will send an order to have them look at his machine. Nothing further needed

## 2013-03-03 ENCOUNTER — Other Ambulatory Visit: Payer: Self-pay | Admitting: Internal Medicine

## 2013-03-03 ENCOUNTER — Telehealth: Payer: Self-pay | Admitting: Internal Medicine

## 2013-03-03 NOTE — Telephone Encounter (Signed)
According to EPIC rx was sent today. i called and made pt aware. Nothing further needed

## 2013-03-29 ENCOUNTER — Telehealth: Payer: Self-pay

## 2013-03-29 NOTE — Telephone Encounter (Signed)
For one month pt has lt knee pain that is a dull achy pain with slight swelling; worse when walks.pain level now 4-5. No redness or warmth of knee. Pt is wearing a knee brace and taking Advil or Tylenol which is not effective for pain. Pt said Dr Ermalene Searing had given pt med for ? Shoulder pain previously; pt said if could have been diclofenac but pt is not sure. Pt does not want to schedule appt but request med to [facility] [place]. Pt request cb.

## 2013-03-30 MED ORDER — DICLOFENAC SODIUM 75 MG PO TBEC: 75.0000 mg | DELAYED_RELEASE_TABLET | Freq: Two times a day (BID) | ORAL | Status: DC

## 2013-03-30 NOTE — Telephone Encounter (Signed)
Diclofenac sent in.

## 2013-03-30 NOTE — Telephone Encounter (Signed)
Left message for patient that Diclofenac has been sent to his pharmacy.

## 2013-05-18 ENCOUNTER — Encounter: Payer: Self-pay | Admitting: Family Medicine

## 2013-05-18 ENCOUNTER — Ambulatory Visit (INDEPENDENT_AMBULATORY_CARE_PROVIDER_SITE_OTHER): Payer: BC Managed Care – PPO | Admitting: Family Medicine

## 2013-05-18 VITALS — BP 150/94 | HR 56 | Temp 98.1°F | Ht 72.0 in | Wt 281.5 lb

## 2013-05-18 DIAGNOSIS — J45901 Unspecified asthma with (acute) exacerbation: Secondary | ICD-10-CM

## 2013-05-18 DIAGNOSIS — J189 Pneumonia, unspecified organism: Secondary | ICD-10-CM

## 2013-05-18 MED ORDER — AZITHROMYCIN 250 MG PO TABS: ORAL_TABLET | ORAL | Status: DC

## 2013-05-18 MED ORDER — PREDNISONE 20 MG PO TABS: ORAL_TABLET | ORAL | Status: DC

## 2013-05-18 NOTE — Progress Notes (Signed)
Pre-visit discussion using our clinic review tool. No additional management support is needed unless otherwise documented below in the visit note.  

## 2013-05-18 NOTE — Progress Notes (Signed)
Date:  05/18/2013   Name:  Carlos Day   DOB:  March 27, 1956   MRN:  [id number] Gender: male Age: 58 y.o.  Primary Physician:  Eliezer Lofts, MD   Chief Complaint: Cough, Chills and chest congestion   Subjective:   History of Present Illness:  Carlos Day is a 58 y.o. very pleasant male patient who presents with the following:  Gentleman with asthma who presents with a several day history of cough that is productive of sputum, some shortness of breath, poor sleeping, coughing throughout the night time. He has been having some significant amount of wheezing. He is also been feeling tired overall, and mildly achy. He has had some nasal congestion.  No energy, coughing up a lot. Not sleeping - coughing all the time. Can sleep in the chair.   Feels like he is having some wheezing.   Asthma   Past Medical History, Surgical History, Social History, Family History, Problem List, Medications, and Allergies have been reviewed and updated if relevant.  Review of Systems: ROS: GEN: Acute illness details above GI: Tolerating PO intake GU: maintaining adequate hydration and urination Pulm: No SOB Interactive and getting along well at home.  Otherwise, ROS is as per the HPI.   Objective:   Physical Examination: BP 150/94  Pulse 56  Temp(Src) 98.1 F (36.7 C) (Oral)  Ht 6' (1.829 m)  Wt 281 lb 8 oz (127.688 kg)  BMI 38.17 kg/m2  SpO2 96%   GEN: A and O x 3. WDWN. NAD.    ENT: Nose clear, ext NML.  No LAD.  No JVD.  TM's clear. Oropharynx clear.  PULM: Normal WOB, no distress. No crackles, diffuse wheezes B, rhonchi. CV: RRR, no M/G/R, No rubs, No JVD.   EXT: warm and well-perfused, No c/c/e. PSYCH: Pleasant and conversant.    Laboratory and Imaging Data:  Assessment & Plan:    Asthma with acute exacerbation  Walking pneumonia  Antibiotics and prednisone for asthma  New medications, updates to list, dose adjustments: Meds ordered this encounter  Medications  .  azithromycin (ZITHROMAX Z-PAK) 250 MG tablet    Sig: Take 2 tablets (500 mg) on  Day 1,  followed by 1 tablet (250 mg) once daily on Days 2 through 5.    Dispense:  6 each    Refill:  0  . predniSONE (DELTASONE) 20 MG tablet    Sig: 2 tabs for 4 days, then 1 tab for 4 days    Dispense:  12 tablet    Refill:  0    Signed,  Ridgely Anastacio T. Marlaysia Lenig, MD, [place] [facility] [place] [address] Phone: [phone] Fax: [phone]    Medication List       This list is accurate as of: 05/18/13  3:55 PM.  Always use your most recent med list.               ADVAIR DISKUS 100-50 MCG/DOSE Aepb  Generic drug:  Fluticasone-Salmeterol  INHALE 1 PUFF INTO THE LUNGS EVERY 12 (TWELVE) HOURS. RINSE MOUTH     albuterol 108 (90 BASE) MCG/ACT inhaler  Commonly known as:  VENTOLIN HFA  2 puffs up to 4 times daily, if needed - rescue inhaler     aspirin 81 MG tablet  Take 81 mg by mouth daily.     azithromycin 250 MG tablet  Commonly known as:  ZITHROMAX Z-PAK  Take 2 tablets (500 mg) on  Day 1,  followed by 1 tablet (250 mg) once daily on Days 2 through 5.     diclofenac 75 MG EC tablet  Commonly known as:  VOLTAREN  Take 1 tablet (75 mg total) by mouth 2 (two) times daily.     lisinopril-hydrochlorothiazide 20-25 MG per tablet  Commonly known as:  PRINZIDE,ZESTORETIC  TAKE 1 TABLET BY MOUTH DAILY.     multivitamin capsule  Take 1 capsule by mouth daily.     predniSONE 20 MG tablet  Commonly known as:  DELTASONE  2 tabs for 4 days, then 1 tab for 4 days

## 2013-06-07 ENCOUNTER — Other Ambulatory Visit: Payer: Self-pay

## 2013-06-07 NOTE — Telephone Encounter (Signed)
Pt request refill diclofenac to [facility] for knee pain.Please advise.

## 2013-06-08 MED ORDER — DICLOFENAC SODIUM 75 MG PO TBEC: 75.0000 mg | DELAYED_RELEASE_TABLET | Freq: Two times a day (BID) | ORAL | Status: DC

## 2013-06-08 NOTE — Telephone Encounter (Signed)
Carlos Day notified prescription refill has been sent to his pharmacy.

## 2013-07-04 ENCOUNTER — Telehealth: Payer: Self-pay

## 2013-07-04 NOTE — Telephone Encounter (Signed)
If we have.Faythe Ghee to provide.

## 2013-07-04 NOTE — Telephone Encounter (Signed)
Pt left v/m requesting samples of Advair.Please advise.

## 2013-07-04 NOTE — Telephone Encounter (Signed)
Advair 100/50 sample x 2 given.  Lot [id number] Exp: 03/2014.  Left message for patient that samples are available to be picked up at front desk.

## 2013-07-25 ENCOUNTER — Other Ambulatory Visit: Payer: Self-pay | Admitting: Family Medicine

## 2013-08-03 ENCOUNTER — Telehealth: Payer: Self-pay | Admitting: Internal Medicine

## 2013-08-03 NOTE — Telephone Encounter (Signed)
Informed patient no samples at this time.  Nothing else needed.

## 2013-08-03 NOTE — Telephone Encounter (Signed)
lmomtcb x1 for pt We have no samples at this time

## 2013-08-15 ENCOUNTER — Telehealth: Payer: Self-pay | Admitting: Internal Medicine

## 2013-08-15 MED ORDER — FLUTICASONE-SALMETEROL 100-50 MCG/DOSE IN AEPB: INHALATION_SPRAY | RESPIRATORY_TRACT | Status: DC

## 2013-08-15 NOTE — Telephone Encounter (Signed)
Made pt aware no samples. I have sent RX to his pharm. Nothing further needed

## 2013-09-11 ENCOUNTER — Other Ambulatory Visit: Payer: Self-pay | Admitting: Family Medicine

## 2013-09-11 NOTE — Telephone Encounter (Signed)
Last office visit 05/18/2013 with Dr. Copland.  Ok to refill? 

## 2013-10-26 ENCOUNTER — Encounter: Payer: Self-pay | Admitting: Internal Medicine

## 2013-10-26 ENCOUNTER — Ambulatory Visit: Payer: BC Managed Care – PPO | Admitting: Internal Medicine

## 2013-10-26 ENCOUNTER — Ambulatory Visit (INDEPENDENT_AMBULATORY_CARE_PROVIDER_SITE_OTHER): Payer: BC Managed Care – PPO | Admitting: Internal Medicine

## 2013-10-26 VITALS — BP 98/60 | HR 65 | Ht 71.0 in | Wt 283.8 lb

## 2013-10-26 DIAGNOSIS — J309 Allergic rhinitis, unspecified: Secondary | ICD-10-CM

## 2013-10-26 DIAGNOSIS — G4733 Obstructive sleep apnea (adult) (pediatric): Secondary | ICD-10-CM

## 2013-10-26 DIAGNOSIS — J4540 Moderate persistent asthma, uncomplicated: Secondary | ICD-10-CM

## 2013-10-26 DIAGNOSIS — J45909 Unspecified asthma, uncomplicated: Secondary | ICD-10-CM

## 2013-10-26 NOTE — Patient Instructions (Signed)
Order- DME [employer]- service or replace old CPAP machine, not holding pressure. 11 cwp, mask of choice, humidifier, supplies   Dx OSA

## 2013-10-26 NOTE — Assessment & Plan Note (Signed)
No acute event but he does recognize me for ongoing control. Finances are limiting but he asks sample Advair

## 2013-10-26 NOTE — Assessment & Plan Note (Signed)
Controlled now after pollen season

## 2013-10-26 NOTE — Progress Notes (Signed)
Subjective:    Patient ID: Carlos Day, male    DOB: 10-Dec-1955, 58 y.o.   MRN: [id number]  HPI 09/19/10- 64 yoM followed for OSA, asthma. Last here- Sep 20, 2009- since then needed shoulder injected but no other major health issues. Denies bad asthma spells. Does note a little wheeze occ with rain. Continues Advair twice daily. Usually only needs rescue inhaler if cuts grass or muggy weather. Sees no problems with his lisinopril BP med.  CPAP 11 uses irregular- says he may miss a night per week. He can tell if he doesn't wear it. Has lost track of his [facility]  08/28/11- 55 yoM followed for OSA, asthma. Wears CPAP approx 5 hours at night; pressure okay on machine. Slight wheezing with increased pollen Needs Ventolin HFA RX for 90 days Comfortable with CPAP at [place]. He is convinced that helps Asthma control has been good with minor wheeze during spring pollen. Daily rescue inhaler once or twice.  10/25/12- 74 yoM followed for OSA, asthma. follows for:  wears CPAP 11/ [facility] approx 4-5 hours at night.  finds that he takes the mask off while he is sleeping.  he thinks that the mask fit is ok.   Only occasional rescue inhaler.  10/26/13- 54 yoM followed for OSA, asthma. FOLLOWS FOR: feels his CPAP11/ [place] is losing pressure, still using every night x5hrs.  he has been very compliant with CPAP, but feels that over the last 2 weeks machine sounds different and does not holding pressure well. Asthma has been controlled but he asks sample Advair   ROS-see HPI Constitutional:   No-   weight loss, night sweats, fevers, chills, fatigue, lassitude. HEENT:   No-  headaches, difficulty swallowing, tooth/dental problems, sore throat,       No-  sneezing, itching, ear ache, nasal congestion, post nasal drip,  CV:  No-   chest pain, orthopnea, PND, swelling in lower extremities, anasarca, dizziness, palpitations Resp: No-   shortness of  breath with exertion or at rest.              No-   productive cough,  No non-productive cough,  No- coughing up of blood.              No-   change in color of mucus.  +little wheezing.   Skin: No-   rash or lesions. GI:  No-   heartburn, indigestion, abdominal pain, nausea, vomiting,  GU: . MS:  No-   joint pain or swelling. . Neuro-     nothing unusual Psych:  No- change in mood or affect. No depression or anxiety.  No memory loss.  OBJ- Physical Exam General- Alert, Oriented, Affect-appropriate, Distress- none acute Skin- rash-none, lesions- none, excoriation- none Lymphadenopathy- none Head- atraumatic            Eyes- Gross vision intact, PERRLA, conjunctivae and secretions clear            Ears- Hearing, canals-normal            Nose- Clear, no-Septal dev, mucus, polyps, erosion, perforation             Throat- Mallampati II , mucosa clear , drainage- none, tonsils- atrophic Neck- flexible , trachea midline, no stridor , thyroid nl, carotid no bruit Chest - symmetrical excursion , unlabored           Heart/CV- RRR , no murmur , no gallop  , no rub, nl s1  s2                           - JVD- none , edema- none, stasis changes- none, varices- none           Lung- clear to P&A, wheeze- none, cough- none , dullness-none, rub- none           Chest wall-  Abd-  Br/ Gen/ Rectal- Not done, not indicated Extrem- cyanosis- none, clubbing, none, atrophy- none, strength- nl Neuro- grossly intact to observation

## 2013-10-26 NOTE — Assessment & Plan Note (Signed)
Good compliance and control until the last 2 weeks. It now sounds as if his machine is failing. Plan-[facility] to assess machine for repair or replacement. Continue pressure at 11

## 2013-11-14 ENCOUNTER — Other Ambulatory Visit: Payer: Self-pay | Admitting: Family Medicine

## 2013-11-14 NOTE — Telephone Encounter (Signed)
Last office visit 05/18/2013 with Dr. Lorelei Pont.  Ok to refill?

## 2013-11-20 ENCOUNTER — Other Ambulatory Visit: Payer: Self-pay | Admitting: Family Medicine

## 2013-11-20 NOTE — Telephone Encounter (Signed)
Pt left v/m; pharmacy told pt needed to call for appt; left v/m pt can call back to schedule CPX.

## 2013-12-01 ENCOUNTER — Ambulatory Visit (INDEPENDENT_AMBULATORY_CARE_PROVIDER_SITE_OTHER): Payer: BC Managed Care – PPO | Admitting: Family Medicine

## 2013-12-01 ENCOUNTER — Ambulatory Visit (INDEPENDENT_AMBULATORY_CARE_PROVIDER_SITE_OTHER)
Admission: RE | Admit: 2013-12-01 | Discharge: 2013-12-01 | Disposition: A | Discharge status: Home / Self Care | Payer: BC Managed Care – PPO | Source: Ambulatory Visit | Attending: Family Medicine | Admitting: Family Medicine

## 2013-12-01 ENCOUNTER — Encounter: Payer: Self-pay | Admitting: Family Medicine

## 2013-12-01 VITALS — BP 130/80 | HR 55 | Temp 98.3°F | Ht 71.0 in | Wt 278.0 lb

## 2013-12-01 DIAGNOSIS — M25569 Pain in unspecified knee: Secondary | ICD-10-CM

## 2013-12-01 DIAGNOSIS — M25561 Pain in right knee: Secondary | ICD-10-CM

## 2013-12-01 MED ORDER — TRAMADOL HCL 50 MG PO TABS: 50.0000 mg | ORAL_TABLET | Freq: Two times a day (BID) | ORAL | Status: DC | PRN

## 2013-12-01 MED ORDER — MELOXICAM 15 MG PO TABS: 15.0000 mg | ORAL_TABLET | Freq: Every day | ORAL | Status: DC

## 2013-12-01 NOTE — Progress Notes (Signed)
   Subjective:    Patient ID: Adrian Saran, male    DOB: [birth year], 58 y.o.   MRN: [id number]  Knee Pain     58 year old male  presents with  New onset pain in right knee. He has been on diclofenac for left knee and shoulder pain. These joint have improved in pain.   He noted 3 weeks of right knee pain. Pain most with walking or going up stairs or pushing on gas pedal.  Pain with bending. Mild swelling, no redness, no increase warmth. No clicking or popping. Occ gives way.  No fever.  No known injury or fall.  He uses a hand floor buffer over then summer... He repetitively twists. Pain is currently 4-5/10  Diclofenac helped intially, but not any longer.     Review of Systems  Constitutional: Negative for fever and fatigue.  HENT: Negative for ear pain.   Eyes: Negative for pain.  Respiratory: Negative for shortness of breath.   Cardiovascular: Negative for chest pain.       Objective:   Physical Exam  Constitutional: He appears well-developed and well-nourished.  obese  Cardiovascular: Normal rate.   No murmur heard. Pulmonary/Chest: Effort normal and breath sounds normal.  Musculoskeletal:       Right knee: He exhibits decreased range of motion and swelling. He exhibits no effusion, no erythema, no bony tenderness and normal meniscus. Tenderness found. Medial joint line and lateral joint line tenderness noted. No MCL, no LCL and no patellar tendon tenderness noted.       Left knee: No tenderness found.  Crepitus B knees with flexion          Assessment & Plan:

## 2013-12-01 NOTE — Assessment & Plan Note (Signed)
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 No sign of fracture or meniscal or ligament injury.  Limit activity, ice, brace. Will X-ray. Change to meloxicam for inflammation and add tramadol for pain. If not improving as expected schedule steroid injection with Dr. Lorelei Pont.

## 2013-12-01 NOTE — Progress Notes (Signed)
Pre visit review using our clinic review tool, if applicable. No additional management support is needed unless otherwise documented below in the visit note. 

## 2013-12-01 NOTE — Patient Instructions (Addendum)
Stop diclofenac.  Ice knee as needed.  Avoid any repetitive twisting/bendingof knee.  Can wear pull on neoprene brace to stabalize Change to meloxicam 15 mg daily for inflammation and pain. Can use tramadol as needed twice daily for pain. We will call with X-ray results. Schedule an appt with Dr. Lorelei Pont in 2 weeks if you are not better for possible steroid injection.

## 2013-12-19 ENCOUNTER — Encounter: Payer: Self-pay | Admitting: Gastroenterology

## 2013-12-20 ENCOUNTER — Ambulatory Visit: Payer: BC Managed Care – PPO | Admitting: Family Medicine

## 2013-12-20 DIAGNOSIS — Z0289 Encounter for other administrative examinations: Secondary | ICD-10-CM

## 2013-12-21 ENCOUNTER — Telehealth: Payer: Self-pay | Admitting: Family Medicine

## 2013-12-21 NOTE — Telephone Encounter (Signed)
Patient did not come for their scheduled appointment 12/21/13 Please let me know if the patient needs to be contacted immediately for follow up or if no follow up is necessary.

## 2013-12-21 NOTE — Telephone Encounter (Signed)
The patient may follow-up entirely at his or her discretion. Each patient is ultimately responsible for their own healthcare needs, given their own accountability in life and free will as an individual.   In this, I defer to the patient's choice in how they decide to manage their own life, including their discretion if they chose to miss an appointment with a physician.  No emergent follow-up needed.

## 2013-12-25 ENCOUNTER — Other Ambulatory Visit: Payer: Self-pay | Admitting: Family Medicine

## 2013-12-25 ENCOUNTER — Other Ambulatory Visit: Payer: Self-pay | Admitting: *Deleted

## 2013-12-25 ENCOUNTER — Telehealth: Payer: Self-pay | Admitting: Internal Medicine

## 2013-12-25 MED ORDER — FLUTICASONE-SALMETEROL 100-50 MCG/DOSE IN AEPB: INHALATION_SPRAY | RESPIRATORY_TRACT | Status: DC

## 2013-12-25 NOTE — Telephone Encounter (Signed)
Last office visit 12/01/2013.  Last refilled 12/01/2013 for #30 with no refills.

## 2013-12-25 NOTE — Telephone Encounter (Signed)
Patient left a voice mail stating that he was seen two weeks ago and was given a pain medication and would like a refill on it. Patient stated that he does not remember the name of it.

## 2013-12-25 NOTE — Telephone Encounter (Signed)
Please advise if okay to leave the pt sample of advair, thanks

## 2013-12-25 NOTE — Telephone Encounter (Signed)
If we have one. Explain we just get a few now. He should check with his insurance to see what is cheapest equivalent.

## 2013-12-25 NOTE — Telephone Encounter (Signed)
Sample documented and placed up front for pick up  [facility] for the pt

## 2013-12-26 MED ORDER — TRAMADOL HCL 50 MG PO TABS
50.0000 mg | ORAL_TABLET | Freq: Two times a day (BID) | ORAL | Status: DC | PRN
Start: 2013-12-26 — End: 2014-03-01

## 2013-12-26 NOTE — Telephone Encounter (Signed)
Pt called to check on status of med refill. I informed him the Dr still has to approve it. Please call pt once medication is approved at [phone]

## 2013-12-26 NOTE — Telephone Encounter (Signed)
Left message for Carlos Day that prescription has been called in to his pharmacy.

## 2013-12-26 NOTE — Telephone Encounter (Signed)
Called to [facility] [address] [place]. 

## 2013-12-27 NOTE — Telephone Encounter (Signed)
Sample has already been picked up. Nothing more needed at this time.

## 2014-01-15 ENCOUNTER — Encounter: Payer: Self-pay | Admitting: Family Medicine

## 2014-01-15 ENCOUNTER — Ambulatory Visit (INDEPENDENT_AMBULATORY_CARE_PROVIDER_SITE_OTHER): Payer: BC Managed Care – PPO | Admitting: Family Medicine

## 2014-01-15 VITALS — BP 128/70 | HR 69 | Temp 98.3°F | Ht 71.0 in | Wt 277.5 lb

## 2014-01-15 DIAGNOSIS — M25569 Pain in unspecified knee: Secondary | ICD-10-CM

## 2014-01-15 DIAGNOSIS — M25562 Pain in left knee: Secondary | ICD-10-CM

## 2014-01-15 MED ORDER — METHYLPREDNISOLONE ACETATE 40 MG/ML IJ SUSP
80.0000 mg | Freq: Once | INTRAMUSCULAR | Status: AC
  Administered 2014-01-15: 80 mg via INTRA_ARTICULAR

## 2014-01-15 NOTE — Progress Notes (Signed)
Pre visit review using our clinic review tool, if applicable. No additional management support is needed unless otherwise documented below in the visit note. 

## 2014-01-15 NOTE — Progress Notes (Signed)
Dr. Frederico Hamman T. Reilley Latorre, MD, [facility] [place], [address] Phone: [phone] Fax: [phone]  01/15/2014  Patient: Carlos Day, MRN: [id number], DOB: 03-31-1956, 58 y.o.  Primary Physician:  Eliezer Lofts, MD  Chief Complaint: Knee Pain  Subjective:   This 58 y.o. male patient presents with 2 week h/o LEFT sided knee pain after no injury. No audible pop was heard. The patient has not had an effusion. No symptomatic giving-way. No mechanical clicking. Joint has not locked up. Patient has been able to walk but is limping. The patient does not have pain going up and down stairs but not rising from a seated position.   Pain location: anterior Current physical activity: active at work Prior Knee Surgery: none Current pain meds: none Bracing: hinged brace Occupation or school level: custodial  The PMH, [place], Social History, Family History, Medications, and allergies have been reviewed in [facility], and have been updated if relevant.  ROS: no acute illness or fever MSK: above GI: tol po intake without nausea or vomitting. Neuro: no numbness, tingling, or radiculopathy O/w per hpi  Objective:   Blood pressure 128/70, pulse 69, temperature 98.3 F (36.8 C), temperature source Oral, height 5\' 11"  (1.803 m), weight 277 lb 8 oz (125.873 kg).  GEN: Well-developed,well-nourished,in no acute distress; alert,appropriate and cooperative throughout examination HEENT: Normocephalic and atraumatic without obvious abnormalities. Ears, externally no deformities PULM: Breathing comfortably in no respiratory distress EXT: No clubbing, cyanosis, or edema PSYCH: Normally interactive. Cooperative during the interview. Pleasant. Friendly and conversant. Not anxious or depressed appearing. Normal, full affect.  Gait: Normal heel toe pattern ROM: 0-120 Effusion: neg Echymosis or edema: none Patellar tendon NT Painful PLICA: neg Patellar  grind: pos Medial and lateral patellar facet loading: negative medial and lateral joint lines:NT Mcmurray's mild pain Flexion-pinch neg Varus and valgus stress: stable Lachman: neg Ant and Post drawer: neg Hip abduction, IR, ER: WNL Hip flexion str: 5/5 Hip abd: 5/5 Quad: 5/5 VMO atrophy:No Hamstring concentric and eccentric: 5/5  Radiology: RIGHT KNEE - COMPLETE 4+ VIEW  COMPARISON: None.  FINDINGS:  Frontal, lateral, bilateral oblique, and sunrise patellar images  were obtained. There is no fracture, dislocation, or effusion. Joint  spaces appear intact. There is a spur arising from the anterior  superior patella. No erosive change.  IMPRESSION:  Calcified spur along the anterior superior patella, probably  representing distal quadriceps tendinosis. No appreciable joint  space narrowing. No fracture or effusion.  Electronically Signed  By: Lowella Grip M.D.  On: 12/01/2013 13:29  The radiological images were independently reviewed by myself in the office and results were reviewed with the patient. 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 Non-WB films. Superior patellar spur noted, o/w normal films. Electronically Signed  By: Owens Loffler, MD On: 01/15/2014 11:07 AM   Assessment and Plan:   Left knee pain  OA vs bone bruise. Alleve 1/2 tab po bid. (Some increase in BP with patient) Ice, brace, activity mod.  Knee Injection, LEFT Patient verbally consented to procedure. Risks (including potential rare risk of infection), benefits, and alternatives explained. Sterilely prepped with Chloraprep. Ethyl cholride used for anesthesia. 8 cc Lidocaine 1% mixed with Depo-Medrol 80 mg injected using the anteromedial approach without difficulty. No complications with procedure and tolerated well. Patient had decreased pain post-injection.   Follow-up: 1 mo if no improvement  New Prescriptions   No medications  on  file   No orders of the defined types were placed in this encounter.    Signed,  Maud Deed. Zanovia Rotz, MD   Patient's Medications  New Prescriptions   No medications on file  Previous Medications   ALBUTEROL (VENTOLIN HFA) 108 (90 BASE) MCG/ACT INHALER    2 puffs up to 4 times daily, if needed - rescue inhaler   ASPIRIN 81 MG TABLET    Take 81 mg by mouth daily.     FLUTICASONE-SALMETEROL (ADVAIR DISKUS) 100-50 MCG/DOSE AEPB    INHALE 1 PUFF INTO THE LUNGS EVERY 12 (TWELVE) HOURS. RINSE MOUTH   LISINOPRIL-HYDROCHLOROTHIAZIDE (PRINZIDE,ZESTORETIC) 20-25 MG PER TABLET    TAKE 1 TABLET BY MOUTH DAILY.   MELOXICAM (MOBIC) 15 MG TABLET    Take 1 tablet (15 mg total) by mouth daily.   MULTIPLE VITAMINS-MINERALS (MULTIVITAMIN WITH MINERALS) TABLET    Take 1 tablet by mouth daily.   TRAMADOL (ULTRAM) 50 MG TABLET    Take 1 tablet (50 mg total) by mouth 2 (two) times daily as needed.  Modified Medications   No medications on file  Discontinued Medications   No medications on file

## 2014-01-15 NOTE — Addendum Note (Signed)
Addended by: Carter Kitten on: 01/15/2014 11:18 AM   Modules accepted: Orders

## 2014-02-26 ENCOUNTER — Other Ambulatory Visit (INDEPENDENT_AMBULATORY_CARE_PROVIDER_SITE_OTHER): Payer: BC Managed Care – PPO

## 2014-02-26 ENCOUNTER — Telehealth: Payer: Self-pay | Admitting: Family Medicine

## 2014-02-26 DIAGNOSIS — I10 Essential (primary) hypertension: Secondary | ICD-10-CM

## 2014-02-26 DIAGNOSIS — Z125 Encounter for screening for malignant neoplasm of prostate: Secondary | ICD-10-CM

## 2014-02-26 LAB — COMPREHENSIVE METABOLIC PANEL
ALT: 40 U/L (ref 0–53)
AST: 29 U/L (ref 0–37)
Albumin: 3.4 g/dL — ABNORMAL LOW (ref 3.5–5.2)
Alkaline Phosphatase: 52 U/L (ref 39–117)
BUN: 14 mg/dL (ref 6–23)
CO2: 22 mEq/L (ref 19–32)
Calcium: 9.3 mg/dL (ref 8.4–10.5)
Chloride: 103 mEq/L (ref 96–112)
Creatinine, Ser: 1 mg/dL (ref 0.4–1.5)
GFR: 84.6 mL/min (ref >60.00)
Glucose, Bld: 110 mg/dL — ABNORMAL HIGH (ref 70–99)
Potassium: 4 mEq/L (ref 3.5–5.1)
Sodium: 136 mEq/L (ref 135–145)
Total Bilirubin: 0.5 mg/dL (ref 0.2–1.2)
Total Protein: 6.4 g/dL (ref 6.0–8.3)

## 2014-02-26 LAB — LIPID PANEL
Cholesterol: 165 mg/dL (ref 0–200)
HDL: 36 mg/dL — ABNORMAL LOW (ref >39.00)
LDL Cholesterol: 111 mg/dL — ABNORMAL HIGH (ref 0–99)
NonHDL: 129
Total CHOL/HDL Ratio: 5
Triglycerides: 90 mg/dL (ref 0.0–149.0)
VLDL: 18 mg/dL (ref 0.0–40.0)

## 2014-02-26 LAB — PSA: PSA: 0.72 ng/mL (ref 0.10–4.00)

## 2014-02-26 NOTE — Telephone Encounter (Signed)
-----   Message from Ellamae Sia sent at 02/21/2014 11:33 AM EDT ----- Regarding: Lab orders for, Monday 11.2.15 Patient is scheduled for CPX labs, please order future labs, Thanks , Karna Christmas

## 2014-02-27 ENCOUNTER — Telehealth: Payer: Self-pay | Admitting: Family Medicine

## 2014-02-27 NOTE — Telephone Encounter (Signed)
emmi emailed °

## 2014-03-01 ENCOUNTER — Ambulatory Visit (INDEPENDENT_AMBULATORY_CARE_PROVIDER_SITE_OTHER): Payer: BC Managed Care – PPO | Admitting: Family Medicine

## 2014-03-01 ENCOUNTER — Encounter: Payer: Self-pay | Admitting: Family Medicine

## 2014-03-01 VITALS — BP 110/70 | HR 60 | Temp 98.3°F | Ht 70.0 in | Wt 280.5 lb

## 2014-03-01 DIAGNOSIS — E1159 Type 2 diabetes mellitus with other circulatory complications: Secondary | ICD-10-CM | POA: Insufficient documentation

## 2014-03-01 DIAGNOSIS — R7309 Other abnormal glucose: Secondary | ICD-10-CM

## 2014-03-01 DIAGNOSIS — E119 Type 2 diabetes mellitus without complications: Secondary | ICD-10-CM | POA: Insufficient documentation

## 2014-03-01 DIAGNOSIS — J4540 Moderate persistent asthma, uncomplicated: Secondary | ICD-10-CM

## 2014-03-01 DIAGNOSIS — I10 Essential (primary) hypertension: Secondary | ICD-10-CM

## 2014-03-01 DIAGNOSIS — E8881 Metabolic syndrome: Secondary | ICD-10-CM | POA: Insufficient documentation

## 2014-03-01 DIAGNOSIS — Z0000 Encounter for general adult medical examination without abnormal findings: Secondary | ICD-10-CM

## 2014-03-01 DIAGNOSIS — R7303 Prediabetes: Secondary | ICD-10-CM

## 2014-03-01 NOTE — Patient Instructions (Addendum)
Work on low carb, low sugar diet. Stop soda. Increase water and fiber and protein in diet. Work on increasing exercise and weight loss.

## 2014-03-01 NOTE — Progress Notes (Signed)
HPI  The patient is here for annual wellness exam and preventative care.  knee feeling better.   HTN: well controlled on lisinopril/HCTZ.  BP Readings from Last 3 Encounters:  03/01/14 110/70  01/15/14 128/70  12/01/13 130/80  Using medication without problems or lightheadedness: None Chest pain with exertion:None Edema:None Short of breath:none Average home BPs:not checking Other issues:   Prediabetes:  Glucose 110.  High cholesterol: LDL at goal < 130 on  No med.  Lab Results  Component Value Date   CHOL 165 02/26/2014   HDL 36.00* 02/26/2014   LDLCALC 111* 02/26/2014   LDLDIRECT 141.7 09/16/2009   TRIG 90.0 02/26/2014   CHOLHDL 5 02/26/2014     No recent exercise.  Wt Readings from Last 3 Encounters:  03/01/14 280 lb 8 oz (127.234 kg)  01/15/14 277 lb 8 oz (125.873 kg)  12/01/13 278 lb (126.1 kg)     Reviewed diet in detail.  Mild intermittant asthma: Controlled with advair. Proair as needed. No hospitalizations.intubations, minimal flares.   Sleep apnea: on CPAP.    Review of Systems  Constitutional: Negative for fever, fatigue and unexpected weight change.  HENT: Negative for ear pain, congestion, sore throat, rhinorrhea, trouble swallowing and postnasal drip.  Eyes: Negative for pain.  Respiratory: Negative for cough, shortness of breath and wheezing.  Cardiovascular: Negative for chest pain, palpitations and leg swelling.  Gastrointestinal: Negative for nausea, abdominal pain, diarrhea, constipation and blood in stool.  Genitourinary: Negative for dysuria, urgency, hematuria, discharge, penile swelling, scrotal swelling, difficulty urinating, penile pain and testicular pain.  Skin: Negative for rash.  Neurological: Positive for headaches. Negative for syncope, weakness, light-headedness and numbness.   Occ momentary sharp pains in head  Psychiatric/Behavioral: Negative for behavioral problems and dysphoric mood. The patient is not  nervous/anxious.       Objective:   Physical Exam  Constitutional: He appears well-developed and well-nourished. Non-toxic appearance. He does not appear ill. No distress.  Overweight.  HENT:  Head: Normocephalic and atraumatic.  Right Ear: Hearing, tympanic membrane, external ear and ear canal normal.  Left Ear: Hearing, tympanic membrane, external ear and ear canal normal.  Nose: Nose normal.  Mouth/Throat: Uvula is midline, oropharynx is clear and moist and mucous membranes are normal.  Eyes: Conjunctivae, EOM and lids are normal. Pupils are equal, round, and reactive to light. No foreign bodies found.  Neck: Trachea normal, normal range of motion and phonation normal. Neck supple. Carotid bruit is not present. No mass and no thyromegaly present.  Cardiovascular: Normal rate, regular rhythm, S1 normal, S2 normal, intact distal pulses and normal pulses. Exam reveals no gallop.  No murmur heard. Pulmonary/Chest: Breath sounds normal. He has no wheezes. He has no rhonchi. He has no rales.  Abdominal: Soft. Normal appearance and bowel sounds are normal. There is no hepatosplenomegaly. There is no tenderness. There is no rebound, no guarding and no CVA tenderness. No hernia. Hernia confirmed negative in the right inguinal area and confirmed negative in the left inguinal area.  Genitourinary: Prostate normal, testes normal and penis normal. Rectal exam shows no external hemorrhoid, no internal hemorrhoid, no fissure, no mass, no tenderness and anal tone normal.  Prostate is not enlarged and not tender. Right testis shows no mass and no tenderness. Left testis shows no mass and no tenderness. No paraphimosis or penile tenderness.  Lymphadenopathy:   He has no cervical adenopathy.   Right: No inguinal adenopathy present.   Left: No inguinal adenopathy present.  Neurological:  He is alert. He has normal strength and normal reflexes. No cranial nerve deficit or sensory deficit.  Gait normal.  Skin: Skin is warm, dry and intact. No rash noted.  Psychiatric: He has a normal mood and affect. His speech is normal and behavior is normal. Judgment normal.          Assessment & Plan:  The patient's preventative maintenance and recommended screening tests for an annual wellness exam were reviewed in full today. Brought up to date unless services declined.  Counselled on the importance of diet, exercise, and its role in overall health and mortality. The patient's FH and SH was reviewed, including their home life, tobacco status, and drug and alcohol status.   Colon: no family colon cancer. Last colon in 2014, seessile polypDr. Eugenia Pancoast, repeat was due 2024. Lab Results  Component Value Date   PSA 0.72 02/26/2014   PSA 0.63 09/16/2009   PSA 0.43 06/22/2008  nonsmoker Vaccine: uptodate Td, intolerant of flu,  He will consider PNA vaccine.

## 2014-03-01 NOTE — Progress Notes (Signed)
Pre visit review using our clinic review tool, if applicable. No additional management support is needed unless otherwise documented below in the visit note. 

## 2014-03-01 NOTE — Assessment & Plan Note (Signed)
Well controlled. Continue current medication.  

## 2014-03-01 NOTE — Assessment & Plan Note (Signed)
Counseled on diet and lifestyle changes. Info given.

## 2014-03-01 NOTE — Assessment & Plan Note (Signed)
Stable. Refuses flu and PNA vaccines at this time.

## 2014-03-06 ENCOUNTER — Other Ambulatory Visit: Payer: Self-pay | Admitting: Family Medicine

## 2014-04-12 ENCOUNTER — Telehealth: Payer: Self-pay | Admitting: Family Medicine

## 2014-04-12 NOTE — Telephone Encounter (Signed)
Pt called wanting to know if he could get another injection in his left knee with dr copland

## 2014-04-12 NOTE — Telephone Encounter (Signed)
Last knee injection was 01/15/2014.  Ok to schedule with you for another injection?

## 2014-04-13 NOTE — Telephone Encounter (Signed)
Left message asking pt to call office  °

## 2014-04-13 NOTE — Telephone Encounter (Signed)
Theoretically yes, but let's get him back so that I can recheck his knee and figure out plan of care.

## 2014-04-13 NOTE — Telephone Encounter (Signed)
Appointment 12/21 pt aware

## 2014-04-16 ENCOUNTER — Ambulatory Visit (INDEPENDENT_AMBULATORY_CARE_PROVIDER_SITE_OTHER): Payer: BC Managed Care – PPO | Admitting: Family Medicine

## 2014-04-16 ENCOUNTER — Encounter: Payer: Self-pay | Admitting: Family Medicine

## 2014-04-16 VITALS — BP 110/74 | HR 54 | Temp 98.3°F | Ht 70.0 in | Wt 284.5 lb

## 2014-04-16 DIAGNOSIS — M25562 Pain in left knee: Secondary | ICD-10-CM

## 2014-04-16 MED ORDER — METHYLPREDNISOLONE ACETATE 40 MG/ML IJ SUSP
80.0000 mg | Freq: Once | INTRAMUSCULAR | Status: AC
  Administered 2014-04-16: 80 mg via INTRA_ARTICULAR

## 2014-04-16 NOTE — Progress Notes (Signed)
Pre visit review using our clinic review tool, if applicable. No additional management support is needed unless otherwise documented below in the visit note. 

## 2014-04-16 NOTE — Progress Notes (Signed)
Dr. Frederico Hamman T. Ermine Spofford, MD, [facility] and Sports Medicine [place], [address] Phone: [phone] Fax: [phone]  04/16/2014  Patient: Carlos Day, MRN: [id number], DOB: 18-Nov-1955, 58 y.o.  Primary Physician:  Eliezer Lofts, MD  Chief Complaint: Knee Pain  Subjective:   4 month history of intermittent knee pain. I saw the patient about 3 months ago, did an intra-articular injection on the left, and he had relief of symptoms for about 2 weeks. He still has pain, more medially, and occasionally has some mechanical symptoms. He has had some pain going up and downstairs.  01/15/2014 Last OV with Owens Loffler, MD  This 58 y.o. male patient presents with 2 week h/o LEFT sided knee pain after no injury. No audible pop was heard. The patient has not had an effusion. No symptomatic giving-way. No mechanical clicking. Joint has not locked up. Patient has been able to walk but is limping. The patient does not have pain going up and down stairs but not rising from a seated position.   Pain location: anterior Current physical activity: active at work Prior Knee Surgery: none Current pain meds: none Bracing: hinged brace Occupation or school level: custodial  The PMH, [place], Social History, Family History, Medications, and allergies have been reviewed in [facility], and have been updated if relevant.  ROS: no acute illness or fever MSK: above GI: tol po intake without nausea or vomitting. Neuro: no numbness, tingling, or radiculopathy O/w per hpi  Objective:   Blood pressure 110/74, pulse 54, temperature 98.3 F (36.8 C), temperature source Oral, height 5\' 10"  (1.778 m), weight 284 lb 8 oz (129.048 kg).  GEN: Well-developed,well-nourished,in no acute distress; alert,appropriate and cooperative throughout examination HEENT: Normocephalic and atraumatic without obvious abnormalities. Ears, externally no deformities PULM: Breathing comfortably in no  respiratory distress EXT: No clubbing, cyanosis, or edema PSYCH: Normally interactive. Cooperative during the interview. Pleasant. Friendly and conversant. Not anxious or depressed appearing. Normal, full affect.  Gait: Normal heel toe pattern ROM: 0-120 Effusion: neg Echymosis or edema: none Patellar tendon NT Painful PLICA: neg Patellar grind: pos Medial and lateral patellar facet loading: negative medial and lateral joint lines: medial joint line pain Mcmurray's mild pain Flexion-pinch neg Varus and valgus stress: stable Lachman: neg Ant and Post drawer: neg Hip abduction, IR, ER: WNL Hip flexion str: 5/5 Hip abd: 5/5 Quad: 5/5 VMO atrophy:No Hamstring concentric and eccentric: 5/5  Radiology: RIGHT KNEE - COMPLETE 4+ VIEW  COMPARISON: None.  FINDINGS:  Frontal, lateral, bilateral oblique, and sunrise patellar images  were obtained. There is no fracture, dislocation, or effusion. Joint  spaces appear intact. There is a spur arising from the anterior  superior patella. No erosive change.  IMPRESSION:  Calcified spur along the anterior superior patella, probably  representing distal quadriceps tendinosis. No appreciable joint  space narrowing. No fracture or effusion.  Electronically Signed  By: Lowella Grip M.D.  On: 12/01/2013 13:29  The radiological images were independently reviewed by myself in the office and results were reviewed with the patient. 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 Non-WB films. Superior patellar spur noted, o/w normal films. Electronically Signed  By: Owens Loffler, MD On: 04/16/2014 11:19 AM   Assessment and Plan:   Left knee pain - Plan: methylPREDNISolone acetate (DEPO-MEDROL) injection 80 mg  We discussed continued conservative management versus potential advanced imaging. At this point, he would like to continue  conservative management.  I discussed potentially  getting an MRI to evaluate for meniscal tear, but he wanted to hold off at this point.  Knee Injection, L Patient verbally consented to procedure. Risks (including potential rare risk of infection), benefits, and alternatives explained. Sterilely prepped with Chloraprep. Ethyl cholride used for anesthesia. 8 cc Lidocaine 1% mixed with Depo-Medrol 80 mg injected using the anteromedial approach without difficulty. No complications with procedure and tolerated well. Patient had decreased pain post-injection. 21 gauge, 2 inch needle  New Prescriptions   No medications on file   No orders of the defined types were placed in this encounter.    Signed,  Maud Deed. Deara Bober, MD   Patient's Medications  New Prescriptions   No medications on file  Previous Medications   ALBUTEROL (VENTOLIN HFA) 108 (90 BASE) MCG/ACT INHALER    2 puffs up to 4 times daily, if needed - rescue inhaler   ASPIRIN 81 MG TABLET    Take 81 mg by mouth daily.     FLUTICASONE-SALMETEROL (ADVAIR DISKUS) 100-50 MCG/DOSE AEPB    INHALE 1 PUFF INTO THE LUNGS EVERY 12 (TWELVE) HOURS. RINSE MOUTH   LISINOPRIL-HYDROCHLOROTHIAZIDE (PRINZIDE,ZESTORETIC) 20-25 MG PER TABLET    Take 1 tablet by mouth daily.   MULTIPLE VITAMINS-MINERALS (MULTIVITAMIN WITH MINERALS) TABLET    Take 1 tablet by mouth daily.  Modified Medications   No medications on file  Discontinued Medications   No medications on file

## 2014-05-15 ENCOUNTER — Encounter: Payer: Self-pay | Admitting: Internal Medicine

## 2014-08-28 ENCOUNTER — Ambulatory Visit (INDEPENDENT_AMBULATORY_CARE_PROVIDER_SITE_OTHER): Payer: BC Managed Care – PPO | Admitting: Family Medicine

## 2014-08-28 ENCOUNTER — Encounter: Payer: Self-pay | Admitting: Family Medicine

## 2014-08-28 VITALS — BP 120/80 | HR 56 | Temp 98.5°F | Ht 70.0 in | Wt 279.0 lb

## 2014-08-28 DIAGNOSIS — J4541 Moderate persistent asthma with (acute) exacerbation: Secondary | ICD-10-CM | POA: Diagnosis not present

## 2014-08-28 DIAGNOSIS — J45901 Unspecified asthma with (acute) exacerbation: Secondary | ICD-10-CM | POA: Insufficient documentation

## 2014-08-28 MED ORDER — AZITHROMYCIN 250 MG PO TABS: ORAL_TABLET | ORAL | Status: DC

## 2014-08-28 MED ORDER — GUAIFENESIN-CODEINE 100-10 MG/5ML PO SYRP: 5.0000 mL | ORAL_SOLUTION | Freq: Every evening | ORAL | Status: DC | PRN

## 2014-08-28 NOTE — Patient Instructions (Signed)
Compete antibiotics.  Use cough suppressant at night   Asthma, Acute Bronchospasm Acute bronchospasm caused by asthma is also referred to as an asthma attack. Bronchospasm means your air passages become narrowed. The narrowing is caused by inflammation and tightening of the muscles in the air tubes (bronchi) in your lungs. This can make it hard to breathe or cause you to wheeze and cough. CAUSES Possible triggers are:  Animal dander from the skin, hair, or feathers of animals.  Dust mites contained in house dust.  Cockroaches.  Pollen from trees or grass.  Mold.  Cigarette or tobacco smoke.  Air pollutants such as dust, household cleaners, hair sprays, aerosol sprays, paint fumes, strong chemicals, or strong odors.  Cold air or weather changes. Cold air may trigger inflammation. Winds increase molds and pollens in the air.  Strong emotions such as crying or laughing hard.  Stress.  Certain medicines such as aspirin or beta-blockers.  Sulfites in foods and drinks, such as dried fruits and wine.  Infections or inflammatory conditions, such as a flu, cold, or inflammation of the nasal membranes (rhinitis).  Gastroesophageal reflux disease (GERD). GERD is a condition where stomach acid backs up into your esophagus.  Exercise or strenuous activity. SIGNS AND SYMPTOMS   Wheezing.  Excessive coughing, particularly at night.  Chest tightness.  Shortness of breath. DIAGNOSIS  Your health care provider will ask you about your medical history and perform a physical exam. A chest X-ray or blood testing may be performed to look for other causes of your symptoms or other conditions that may have triggered your asthma attack. TREATMENT  Treatment is aimed at reducing inflammation and opening up the airways in your lungs. Most asthma attacks are treated with inhaled medicines. These include quick relief or rescue medicines (such as bronchodilators) and controller medicines (such  as inhaled corticosteroids). These medicines are sometimes given through an inhaler or a nebulizer. Systemic steroid medicine taken by mouth or given through an IV tube also can be used to reduce the inflammation when an attack is moderate or severe. Antibiotic medicines are only used if a bacterial infection is present.  HOME CARE INSTRUCTIONS   Rest.  Drink plenty of liquids. This helps the mucus to remain thin and be easily coughed up. Only use caffeine in moderation and do not use alcohol until you have recovered from your illness.  Do not smoke. Avoid being exposed to secondhand smoke.  You play a critical role in keeping yourself in good health. Avoid exposure to things that cause you to wheeze or to have breathing problems.  Keep your medicines up-to-date and available. Carefully follow your health care provider's treatment plan.  Take your medicine exactly as prescribed.  When pollen or pollution is bad, keep windows closed and use an air conditioner or go to places with air conditioning.  Asthma requires careful medical care. See your health care provider for a follow-up as advised. If you are more than [redacted] weeks pregnant and you were prescribed any new medicines, let your obstetrician know about the visit and how you are doing. Follow up with your health care provider as directed.  After you have recovered from your asthma attack, make an appointment with your outpatient doctor to talk about ways to reduce the likelihood of future attacks. If you do not have a doctor who manages your asthma, make an appointment with a primary care doctor to discuss your asthma. SEEK IMMEDIATE MEDICAL CARE IF:   You are getting worse.  You have trouble breathing. If severe, call your local emergency services (911 in the [place]  You develop chest pain or discomfort.  You are vomiting.  You are not able to keep fluids down.  You are coughing up yellow, green, brown, or bloody sputum.  You have  a fever and your symptoms suddenly get worse.  You have trouble swallowing. MAKE SURE YOU:   Understand these instructions.  Will watch your condition.  Will get help right away if you are not doing well or get worse. Document Released: 07/29/2006 Document Revised: 04/18/2013 Document Reviewed: 10/19/2012 [facility] Patient Information 2015 [place], [place]. This information is not intended to replace advice given to you by your health care provider. Make sure you discuss any questions you have with your health care provider.

## 2014-08-28 NOTE — Progress Notes (Signed)
   Subjective:    Patient ID: Carlos Day, male    DOB: 11/10/1955, 59 y.o.   MRN: [id number]  Cough This is a new problem. The current episode started 1 to 4 weeks ago (2 weeks). The problem has been gradually worsening. The cough is productive of sputum. Associated symptoms include nasal congestion, a sore throat, shortness of breath and wheezing. Pertinent negatives include no chills, ear congestion, ear pain, fever or myalgias. Associated symptoms comments: No sinus pressure. The symptoms are aggravated by lying down (cough keeping him up at night.). Risk factors: nonsmoker. Treatments tried: theraflu, tylenol decongestant. The treatment provided mild relief.  Sore Throat  Associated symptoms include coughing and shortness of breath. Pertinent negatives include no ear pain.   He has some sneezing  But not much, no significant allergy symptoms.  Asthma, moderate persistent on Advair daily. Having to using abluterol 2 times a day.  Review of Systems  Constitutional: Negative for fever and chills.  HENT: Positive for sore throat. Negative for ear pain.   Respiratory: Positive for cough, shortness of breath and wheezing.   Musculoskeletal: Negative for myalgias.       Objective:   Physical Exam  Constitutional: Vital signs are normal. He appears well-developed and well-nourished.  HENT:  Head: Normocephalic.  Right Ear: Hearing normal.  Left Ear: Hearing normal.  Nose: Mucosal edema and rhinorrhea present. Right sinus exhibits no maxillary sinus tenderness and no frontal sinus tenderness. Left sinus exhibits no maxillary sinus tenderness and no frontal sinus tenderness.  Mouth/Throat: Oropharynx is clear and moist and mucous membranes are normal. No posterior oropharyngeal edema, posterior oropharyngeal erythema or tonsillar abscesses.  Neck: Trachea normal. Carotid bruit is not present. No thyroid mass and no thyromegaly present.  Cardiovascular: Normal rate, regular rhythm, normal  heart sounds and normal pulses.  Exam reveals no gallop, no distant heart sounds and no friction rub.   No murmur heard. No peripheral edema  Pulmonary/Chest: Effort normal. No respiratory distress. He has decreased breath sounds. He has no wheezes. He has no rhonchi. He has no rales.  Skin: Skin is warm, dry and intact. No rash noted.  Psychiatric: He has a normal mood and affect. His speech is normal and behavior is normal. Thought content normal.          Assessment & Plan:

## 2014-08-28 NOTE — Assessment & Plan Note (Signed)
Likely bacterial trigger given purulent sputum and > 2 weeks of symtpoms.  Treat with antibiotics.   Peak flow at goal today at 530. No indication for steroids. Cough suppressant at night.

## 2014-08-28 NOTE — Progress Notes (Signed)
Pre visit review using our clinic review tool, if applicable. No additional management support is needed unless otherwise documented below in the visit note. 

## 2014-09-30 ENCOUNTER — Other Ambulatory Visit: Payer: Self-pay | Admitting: Family Medicine

## 2014-09-30 ENCOUNTER — Other Ambulatory Visit: Payer: Self-pay | Admitting: Internal Medicine

## 2014-10-30 ENCOUNTER — Encounter: Payer: Self-pay | Admitting: Internal Medicine

## 2014-10-30 ENCOUNTER — Ambulatory Visit (INDEPENDENT_AMBULATORY_CARE_PROVIDER_SITE_OTHER): Payer: BC Managed Care – PPO | Admitting: Internal Medicine

## 2014-10-30 ENCOUNTER — Telehealth: Payer: Self-pay | Admitting: Internal Medicine

## 2014-10-30 ENCOUNTER — Ambulatory Visit: Payer: BC Managed Care – PPO | Admitting: Internal Medicine

## 2014-10-30 VITALS — BP 116/78 | HR 58 | Ht 72.0 in | Wt 281.0 lb

## 2014-10-30 DIAGNOSIS — G4733 Obstructive sleep apnea (adult) (pediatric): Secondary | ICD-10-CM

## 2014-10-30 DIAGNOSIS — J4540 Moderate persistent asthma, uncomplicated: Secondary | ICD-10-CM

## 2014-10-30 MED ORDER — FLUTICASONE-SALMETEROL 100-50 MCG/DOSE IN AEPB: INHALATION_SPRAY | RESPIRATORY_TRACT | Status: DC

## 2014-10-30 MED ORDER — ALBUTEROL SULFATE HFA 108 (90 BASE) MCG/ACT IN AERS: INHALATION_SPRAY | RESPIRATORY_TRACT | Status: DC

## 2014-10-30 NOTE — Telephone Encounter (Signed)
Ok to order replacement CPAP machine, current pressure, mask of choice, supplies, humidifier, Airview  Dx OSA

## 2014-10-30 NOTE — Patient Instructions (Signed)
Refill scripts printed for your asthma inhalers  Order- DME [facility]    Download cpap for pressure compliance                 Needs replacement mask of choice, supplies    Dx OSA

## 2014-10-30 NOTE — Telephone Encounter (Signed)
lmtcb x1 for [place].

## 2014-10-30 NOTE — Progress Notes (Signed)
Subjective:    Patient ID: Carlos Day, male    DOB: [birth year], 59 y.o.   MRN: [id number]  HPI 09/19/10- 84 yoM followed for OSA, asthma. Last here- Sep 20, 2009- since then needed shoulder injected but no other major health issues. Denies bad asthma spells. Does note a little wheeze occ with rain. Continues Advair twice daily. Usually only needs rescue inhaler if cuts grass or muggy weather. Sees no problems with his lisinopril BP med.  CPAP 11 uses irregular- says he may miss a night per week. He can tell if he doesn't wear it. Has lost track of his [facility]  08/28/11- 55 yoM followed for OSA, asthma. Wears CPAP approx 5 hours at night; pressure okay on machine. Slight wheezing with increased pollen Needs Ventolin HFA RX for 90 days Comfortable with CPAP at [place]. He is convinced that helps Asthma control has been good with minor wheeze during spring pollen. Daily rescue inhaler once or twice.  10/25/12- 48 yoM followed for OSA, asthma. follows for:  wears CPAP 11/ [facility] approx 4-5 hours at night.  finds that he takes the mask off while he is sleeping.  he thinks that the mask fit is ok.   Only occasional rescue inhaler.  10/26/13- 53 yoM followed for OSA, asthma. FOLLOWS FOR: feels his CPAP11/ [place] is losing pressure, still using every night x5hrs.  he has been very compliant with CPAP, but feels that over the last 2 weeks machine sounds different and does not holding pressure well. Asthma has been controlled but he asks sample Advair   10/27/14- 56 yoM followed for OSA, asthma FOLLOW FOR: wears CPAP 11/ [facility] machine every night, approx. 4-5 hours.  Needs CPAP supplies Feels very comfortable with his CPAP at current settings. Machine is not yet 58 years old. We discussed timing, maintenance and supplies. Admits some shortness of breath in humid weather. Discussed cost of Advair. Asthma does not wake him from  sleep. Rare need for rescue inhaler.  ROS-see HPI Constitutional:   No-   weight loss, night sweats, fevers, chills, fatigue, lassitude. HEENT:   No-  headaches, difficulty swallowing, tooth/dental problems, sore throat,       No-  sneezing, itching, ear ache, nasal congestion, post nasal drip,  CV:  No-   chest pain, orthopnea, PND, swelling in lower extremities, anasarca, dizziness, palpitations Resp: No-   shortness of breath with exertion or at rest.              No-   productive cough,  No non-productive cough,  No- coughing up of blood.              No-   change in color of mucus.  +little wheezing.   Skin: No-   rash or lesions. GI:  No-   heartburn, indigestion, abdominal pain, nausea, vomiting,  GU: . MS:  No-   joint pain or swelling. . Neuro-     nothing unusual Psych:  No- change in mood or affect. No depression or anxiety.  No memory loss.  OBJ- Physical Exam General- Alert, Oriented, Affect-appropriate, Distress- none acute Skin- rash-none, lesions- none, excoriation- none Lymphadenopathy- none Head- atraumatic            Eyes- Gross vision intact, PERRLA, conjunctivae and secretions clear            Ears- Hearing, canals-normal            Nose- Clear, no-Septal  dev, mucus, polyps, erosion, perforation             Throat- Mallampati II , mucosa clear , drainage- none, tonsils- atrophic Neck- flexible , trachea midline, no stridor , thyroid nl, carotid no bruit Chest - symmetrical excursion , unlabored           Heart/CV- RRR , no murmur , no gallop  , no rub, nl s1 s2                           - JVD- none , edema- none, stasis changes- none, varices- none           Lung- clear to P&A, wheeze- none, cough- none , dullness-none, rub- none           Chest wall-  Abd-  Br/ Gen/ Rectal- Not done, not indicated Extrem- cyanosis- none, clubbing, none, atrophy- none, strength- nl Neuro- grossly intact to observation

## 2014-10-30 NOTE — Telephone Encounter (Signed)
Spoke with Danae Chen at [facility] that they received the order for the D/L and new supplies for CPAP machine. Per Danae Chen, pt machine is so old that they are unable to download t -- pt has a S6/S7 Need an order placed for the patient to get an updated machine (S10) and they will be able to then enroll him in [place].  Pt has an appt with [facility] for new supplies so that he can continue using his current machine in the meantime.   Please advise Dr Annamaria Boots. Thanks.

## 2014-10-30 NOTE — Telephone Encounter (Signed)
Order was sent

## 2014-10-31 ENCOUNTER — Telehealth: Payer: Self-pay | Admitting: Internal Medicine

## 2014-10-31 DIAGNOSIS — G4733 Obstructive sleep apnea (adult) (pediatric): Secondary | ICD-10-CM

## 2014-10-31 NOTE — Telephone Encounter (Signed)
I have already faxed the ov notes from yesterday and sleep study to [place] at [facility], she just needs rx with pressure setting on 11

## 2014-10-31 NOTE — Telephone Encounter (Signed)
Order entered for Pressure setting 11. Nothing further needed.

## 2014-11-05 NOTE — Assessment & Plan Note (Signed)
Good subjective control reported. Weight loss would help as discussed. Plan-download for documentation

## 2014-11-05 NOTE — Assessment & Plan Note (Signed)
Asthma pattern has been better controlled-mild intermittent uncomplicated. Plan-medication refills with discussion

## 2014-12-09 ENCOUNTER — Other Ambulatory Visit: Payer: Self-pay | Admitting: Internal Medicine

## 2014-12-25 ENCOUNTER — Telehealth: Payer: Self-pay

## 2014-12-25 MED ORDER — SILDENAFIL CITRATE 100 MG PO TABS: 50.0000 mg | ORAL_TABLET | Freq: Every day | ORAL | Status: DC | PRN

## 2014-12-25 NOTE — Telephone Encounter (Signed)
Pt request refill viagra to [facility] in [place]. Last annual exam on 03/01/2014. Pt said has not used viagra for a while and request new rx to [facility] [place] [facility](med not found on current or hx med list).Please advise.

## 2014-12-25 NOTE — Telephone Encounter (Signed)
Rx sent 

## 2014-12-25 NOTE — Telephone Encounter (Signed)
Carlos Day notified prescription has been sent to his pharmacy as requested.

## 2015-01-28 ENCOUNTER — Telehealth: Payer: Self-pay

## 2015-01-28 NOTE — Telephone Encounter (Signed)
PLEASE NOTE: All timestamps contained within this report are represented as [place] Standard Time. CONFIDENTIALTY NOTICE: This fax transmission is intended only for the addressee. It contains information that is legally privileged, confidential or otherwise protected from use or disclosure. If you are not the intended recipient, you are strictly prohibited from reviewing, disclosing, copying using or disseminating any of this information or taking any action in reliance on or regarding this information. If you have received this fax in error, please notify us immediately by telephone so that we can arrange for its return to [place]. Phone: [phone], Toll-Free: [phone], Fax: [phone] Page: 1 of 2 Call Id: [id number] [place] Patient Name: Carlos Day Gender: Male DOB: 1955-12-09 Age: 59 Y 53 M 6 D Return Phone Number: [phone] (Primary) Address: City/State/Zip: [place] Client [place] [facility] - Client Client Site [place] Physician Diona Browner, [place] Contact Type Call Call Type Triage / Clinical Relationship To Patient Self Return Phone Number [phone] (Primary) Chief Complaint Medication Question (non symptomatic) Initial Comment caller states his wife has a rx cream that he wants to use - needs to ask if this is ok for him to use it since it is for his wife PreDisposition Home Care Nurse Assessment Nurse: Harlow Mares, RN, Suanne Marker Date/Time (Eastern Time): 01/25/2015 5:10:56 PM Confirm and document reason for call. If symptomatic, describe symptoms. ---Reports that his knee has been hurting in the joint and he wants to use the cream prescribed for his wife. Reports that the cream is Voltaren. Reports that he has been icing the knee. Reports that he has been using the cream which is proving to help with the pain. Reports that the (L) knee  is involved. Has the patient traveled out of the country within the last 30 days? ---Not Applicable Does the patient require triage? ---Yes Related visit to physician within the last 2 weeks? ---No Does the PT have any chronic conditions? (i.e. diabetes, asthma, etc.) ---Yes List chronic conditions. ---arthritis Guidelines Guideline Title Affirmed Question Affirmed Notes Nurse Date/Time Eilene Ghazi Time) Knee Swelling [1] MODERATE pain (e.g., interferes with normal activities, limping) AND [2] present > 3 days Ferd Glassing 01/25/2015 5:15:11 PM Disp. Time Eilene Ghazi Time) Disposition Final User 01/25/2015 5:19:37 PM See PCP When Office is Open (within 3 days) Yes Harlow Mares, RN, Rosalyn Charters Understands: Yes PLEASE NOTE: All timestamps contained within this report are represented as [place] Standard Time. CONFIDENTIALTY NOTICE: This fax transmission is intended only for the addressee. It contains information that is legally privileged, confidential or otherwise protected from use or disclosure. If you are not the intended recipient, you are strictly prohibited from reviewing, disclosing, copying using or disseminating any of this information or taking any action in reliance on or regarding this information. If you have received this fax in error, please notify us immediately by telephone so that we can arrange for its return to [place]. Phone: [phone], Toll-Free: [phone], Fax: [phone] Page: 2 of 2 Call Id: [id number] Disagree/Comply: Comply Care Advice Given Per Guideline SEE PCP WITHIN 3 DAYS: * You need to be seen within 2 or 3 days. Call your doctor during regular office hours and make an appointment. An urgent care center is often the best source of care if your doctor's office is closed or you can't get an appointment. NOTE: If office will be open tomorrow, tell caller to call then, not in 3  days. REST YOUR KNEE for the next couple days: * Avoid activities that cause any  pain. * Reduce activities that put a lot of strain on the knee joint (e.g., deep knee bends, stair climbing, running). NSAID FOR SWELLING: * Take ibuprofen every 6-8 hours. Ibuprofen is an anti-inflammatory and may help reduce the swelling. PAIN MEDICINES: * For pain relief, take acetaminophen, ibuprofen, or naproxen. * Use the lowest amount that makes your pain feel better. IBUPROFEN (E.G., MOTRIN, ADVIL): * Take 400 mg (two 200 mg pills) by mouth every 6 hours as needed. * Another choice is to take 600 mg (three 200 mg pills) by mouth every 8 hours as needed. * The most you should take each day is 1,200 mg (six 200 mg pills a day), unless your doctor has told you to take more. CAUTION - NSAIDS (E.G., IBUPROFEN, NAPROXEN): * You may take this medicine with or without food. Taking it with food or milk may lessen the chance the drug will upset your stomach. CALL BACK IF: * You develop a fever * Knee becomes red or very painful * Knee becomes very swollen * You become worse. CARE ADVICE given per Knee Joint Swelling (Adult) guideline. After Care Instructions Given Call Event Type User Date / Time Description

## 2015-04-04 ENCOUNTER — Telehealth: Payer: Self-pay | Admitting: Internal Medicine

## 2015-04-04 NOTE — Telephone Encounter (Signed)
Samples of Advair have been placed up front for pick up. Pt is aware. Nothing further was needed.

## 2015-04-29 ENCOUNTER — Other Ambulatory Visit: Payer: Self-pay | Admitting: Family Medicine

## 2015-04-30 NOTE — Telephone Encounter (Signed)
Spoke to patient.  CPE scheduled 05/07/15 with same day labs.  Thirty day supply refilled.

## 2015-04-30 NOTE — Telephone Encounter (Signed)
pts last f/u 02/2014-CPE. pls advise

## 2015-04-30 NOTE — Telephone Encounter (Signed)
Pt needs CPX scheduled. Refill until then.

## 2015-05-07 ENCOUNTER — Encounter: Payer: BC Managed Care – PPO | Admitting: Family Medicine

## 2015-05-16 ENCOUNTER — Telehealth: Payer: Self-pay

## 2015-05-16 NOTE — Telephone Encounter (Signed)
Pt request samples of Advair; advised pt [facility] no longer has any samples of medications and pt will ck with Dr Bertrum Sol office.

## 2015-05-22 ENCOUNTER — Telehealth: Payer: Self-pay | Admitting: Internal Medicine

## 2015-05-22 NOTE — Telephone Encounter (Signed)
Spoke with pt. He needs samples of Advair. These have been placed up front for pick up. Nothing further was needed.

## 2015-05-27 ENCOUNTER — Ambulatory Visit (INDEPENDENT_AMBULATORY_CARE_PROVIDER_SITE_OTHER): Payer: BC Managed Care – PPO | Admitting: Family Medicine

## 2015-05-27 ENCOUNTER — Encounter: Payer: Self-pay | Admitting: Family Medicine

## 2015-05-27 VITALS — BP 130/86 | HR 59 | Temp 98.2°F | Ht 70.0 in | Wt 289.5 lb

## 2015-05-27 DIAGNOSIS — M25562 Pain in left knee: Secondary | ICD-10-CM

## 2015-05-27 DIAGNOSIS — M25561 Pain in right knee: Secondary | ICD-10-CM | POA: Insufficient documentation

## 2015-05-27 DIAGNOSIS — Z0000 Encounter for general adult medical examination without abnormal findings: Secondary | ICD-10-CM

## 2015-05-27 DIAGNOSIS — E8881 Metabolic syndrome: Secondary | ICD-10-CM | POA: Diagnosis not present

## 2015-05-27 DIAGNOSIS — Z113 Encounter for screening for infections with a predominantly sexual mode of transmission: Secondary | ICD-10-CM | POA: Diagnosis not present

## 2015-05-27 DIAGNOSIS — R7303 Prediabetes: Secondary | ICD-10-CM

## 2015-05-27 DIAGNOSIS — I10 Essential (primary) hypertension: Secondary | ICD-10-CM

## 2015-05-27 DIAGNOSIS — Z125 Encounter for screening for malignant neoplasm of prostate: Secondary | ICD-10-CM | POA: Diagnosis not present

## 2015-05-27 LAB — HEPATITIS PANEL, ACUTE
HCV Ab: NEGATIVE
Hep A IgM: NONREACTIVE
Hep B C IgM: NONREACTIVE
Hepatitis B Surface Ag: NEGATIVE

## 2015-05-27 LAB — COMPREHENSIVE METABOLIC PANEL
ALT: 45 U/L (ref 0–53)
AST: 34 U/L (ref 0–37)
Albumin: 4.3 g/dL (ref 3.5–5.2)
Alkaline Phosphatase: 52 U/L (ref 39–117)
BUN: 11 mg/dL (ref 6–23)
CO2: 32 mEq/L (ref 19–32)
Calcium: 9.7 mg/dL (ref 8.4–10.5)
Chloride: 102 mEq/L (ref 96–112)
Creatinine, Ser: 0.92 mg/dL (ref 0.40–1.50)
GFR: 89.54 mL/min (ref >60.00)
Glucose, Bld: 99 mg/dL (ref 70–99)
Potassium: 4.1 mEq/L (ref 3.5–5.1)
Sodium: 140 mEq/L (ref 135–145)
Total Bilirubin: 0.6 mg/dL (ref 0.2–1.2)
Total Protein: 7.1 g/dL (ref 6.0–8.3)

## 2015-05-27 LAB — LIPID PANEL
Cholesterol: 155 mg/dL (ref 0–200)
HDL: 36.7 mg/dL — ABNORMAL LOW (ref >39.00)
LDL Cholesterol: 97 mg/dL (ref 0–99)
NonHDL: 118.24
Total CHOL/HDL Ratio: 4
Triglycerides: 106 mg/dL (ref 0.0–149.0)
VLDL: 21.2 mg/dL (ref 0.0–40.0)

## 2015-05-27 LAB — HEMOGLOBIN A1C: Hgb A1c MFr Bld: 6.4 % (ref 4.6–6.5)

## 2015-05-27 LAB — PSA: PSA: 0.61 ng/mL (ref 0.10–4.00)

## 2015-05-27 LAB — HIV ANTIBODY (ROUTINE TESTING W REFLEX): HIV 1&2 Ab, 4th Generation: NONREACTIVE

## 2015-05-27 MED ORDER — TRAMADOL HCL 50 MG PO TABS: 50.0000 mg | ORAL_TABLET | Freq: Every day | ORAL | Status: DC

## 2015-05-27 MED ORDER — DICLOFENAC SODIUM 1 % TD GEL: 4.0000 g | Freq: Four times a day (QID) | TRANSDERMAL | Status: DC

## 2015-05-27 MED ORDER — ALBUTEROL SULFATE HFA 108 (90 BASE) MCG/ACT IN AERS: INHALATION_SPRAY | RESPIRATORY_TRACT | Status: DC

## 2015-05-27 MED ORDER — SILDENAFIL CITRATE 20 MG PO TABS: 20.0000 mg | ORAL_TABLET | Freq: Every day | ORAL | Status: DC | PRN

## 2015-05-27 NOTE — Patient Instructions (Addendum)
Start diclofenac gel on knees. Can use tramadol for break thorugh pain as needed at bedtime. Call if pain is not improving., for referral to [facility] or could return to see Dr. Lorelei Pont.  Stop at lab on way out.  Get back to exercise and healthy eating.

## 2015-05-27 NOTE — Progress Notes (Signed)
The patient is here for annual wellness exam and preventative care.  Continues to have bilateral knee pain.  Using ibuprofen 600  Mg.  In past has seen Dr. Lorelei Pont. Left knee pain improved after injection in 03/2014. X-ray left knee: IMPRESSION:  Calcified spur along the anterior superior patella, probably  representing distal quadriceps tendinosis. No appreciable joint  space narrowing. No fracture or effusion.  In past diclofenac gel and tramadol helped with pain.   HTN: well controlled on lisinopril/HCTZ.  BP Readings from Last 3 Encounters:  05/27/15 130/86  10/30/14 116/78  08/28/14 120/80  Using medication without problems or lightheadedness: None Chest pain with exertion:None Edema:None Short of breath:none Average home BPs:not checking Other issues:  Prediabetes: due for re-eval.  High cholesterol:  Due for re-eval. LDL goal < 130 on No med.   Recent Labs    Lab Results  Component Value Date   CHOL 165 02/26/2014   HDL 36.00* 02/26/2014   LDLCALC 111* 02/26/2014   LDLDIRECT 141.7 09/16/2009   TRIG 90.0 02/26/2014   CHOLHDL 5 02/26/2014       No recent exercise.  Wt Readings from Last 3 Encounters:  05/27/15 289 lb 8 oz (131.316 kg)  10/30/14 281 lb (127.461 kg)  08/28/14 279 lb (126.554 kg)  Reviewed diet in detail.  Mild intermittant asthma: Controlled with advair. Proair as needed about 2 times a month mild wheezing. No hospitalizations.intubations, minimal flares.   Sleep apnea: on CPAP.   ED: difficulty getting and erection and length of time keeping. Good libido.   Review of Systems  Constitutional: Negative for fever, fatigue and unexpected weight change.  HENT: Negative for ear pain, congestion, sore throat, rhinorrhea, trouble swallowing and postnasal drip.  Eyes: Negative for pain.  Respiratory: Negative for cough, shortness of breath and wheezing.  Cardiovascular: Negative for chest pain,  palpitations and leg swelling.  Gastrointestinal: Negative for nausea, abdominal pain, diarrhea, constipation and blood in stool.  Genitourinary: Negative for dysuria, urgency, hematuria, discharge, penile swelling, scrotal swelling, difficulty urinating, penile pain and testicular pain.  Skin: Negative for rash.  Neurological: . Negative for syncope, weakness, light-headedness and numbness.   Psychiatric/Behavioral: Negative for behavioral problems and dysphoric mood. The patient is not nervous/anxious.      Objective:  Physical Exam  Constitutional: He appears well-developed and well-nourished. Non-toxic appearance. He does not appear ill. No distress.  Overweight.  HENT:  Head: Normocephalic and atraumatic.  Right Ear: Hearing, tympanic membrane, external ear and ear canal normal.  Left Ear: Hearing, tympanic membrane, external ear and ear canal normal.  Nose: Nose normal.  Mouth/Throat: Uvula is midline, oropharynx is clear and moist and mucous membranes are normal.  Eyes: Conjunctivae, EOM and lids are normal. Pupils are equal, round, and reactive to light. No foreign bodies found.  Neck: Trachea normal, normal range of motion and phonation normal. Neck supple. Carotid bruit is not present. No mass and no thyromegaly present.  Cardiovascular: Normal rate, regular rhythm, S1 normal, S2 normal, intact distal pulses and normal pulses. Exam reveals no gallop.  No murmur heard. Pulmonary/Chest: Breath sounds normal. He has no wheezes. He has no rhonchi. He has no rales.  Abdominal: Soft. Normal appearance and bowel sounds are normal. There is no hepatosplenomegaly. There is no tenderness. There is no rebound, no guarding and no CVA tenderness. No hernia. Hernia confirmed negative in the right inguinal area and confirmed negative in the left inguinal area.  Genitourinary: Prostate normal, testes normal and penis normal.  Rectal exam shows no external hemorrhoid, no  internal hemorrhoid, no fissure, no mass, no tenderness and anal tone normal. Prostate is not enlarged and not tender. Right testis shows no mass and no tenderness. Left testis shows no mass and no tenderness. No paraphimosis or penile tenderness.  Lymphadenopathy:   He has no cervical adenopathy.   Right: No inguinal adenopathy present.   Left: No inguinal adenopathy present.  Neurological: He is alert. He has normal strength and normal reflexes. No cranial nerve deficit or sensory deficit. Gait normal.  Skin: Skin is warm, dry and intact. No rash noted.  Psychiatric: He has a normal mood and affect. His speech is normal and behavior is normal. Judgment normal.    Diabetic foot exam: Normal inspection No skin breakdown No calluses  Normal DP pulses Normal sensation to light touch and monofilament Nails normal      Assessment & Plan:  The patient's preventative maintenance and recommended screening tests for an annual wellness exam were reviewed in full today. Brought up to date unless services declined.  Counselled on the importance of diet, exercise, and its role in overall health and mortality. The patient's FH and SH was reviewed, including their home life, tobacco status, and drug and alcohol status.   Colon: no family colon cancer. Last colon in 2014, seessile polyp Dr. Eugenia Pancoast, repeat was due 2024. Due for re-check PSA. Nonsmoker Vaccine: uptodate Td, intolerant of flu, He will consider PNA vaccine. HIV: will do.

## 2015-05-27 NOTE — Assessment & Plan Note (Signed)
Treat with topical diclofenac and tramadol prn pain. If not improving consider referral for futher eval and treatment.

## 2015-05-27 NOTE — Assessment & Plan Note (Signed)
Due for re-eval. 

## 2015-05-27 NOTE — Assessment & Plan Note (Signed)
Well controlled. Continue current medication.  

## 2015-05-27 NOTE — Progress Notes (Signed)
Pre visit review using our clinic review tool, if applicable. No additional management support is needed unless otherwise documented below in the visit note. 

## 2015-05-28 ENCOUNTER — Telehealth: Payer: Self-pay | Admitting: Family Medicine

## 2015-05-28 LAB — RPR

## 2015-05-28 NOTE — Telephone Encounter (Signed)
Patient returned Donna's call. °

## 2015-05-28 NOTE — Telephone Encounter (Signed)
Lab results discussed with Carlos Day.  See result note from 05/27/2015 labs.

## 2015-06-08 ENCOUNTER — Other Ambulatory Visit: Payer: Self-pay | Admitting: Family Medicine

## 2015-06-25 ENCOUNTER — Telehealth: Payer: Self-pay

## 2015-06-25 MED ORDER — DICLOFENAC SODIUM 75 MG PO TBEC: 75.0000 mg | DELAYED_RELEASE_TABLET | Freq: Two times a day (BID) | ORAL | Status: DC

## 2015-06-25 NOTE — Telephone Encounter (Signed)
Pt left v/m; pt was seen 05/27/15 for annual exam; Tramadol is not helping knee pain at nighttime and pt request different pain med; diclofenac gel is too expensive and pt request to try the diclofenac tabs instead. In 05/27/15 office note was mentioned if knee pain not better may do ortho referral or appt with Dr Lorelei Pont. [employer].

## 2015-06-25 NOTE — Telephone Encounter (Signed)
Rec ortho referral or appt with Dr. Loletha Grayer. Let me know if pt agreeable. Will send in diclofenac tabs as well.  Can try 2 tabs of tramadol for pain at a time.

## 2015-06-25 NOTE — Telephone Encounter (Signed)
Mr. Saltarelli notified as instructed by telephone.  He wants to try the diclofenac and increasing the Tramadol for a couple of weeks.  He will call back if the medications don't  help and make an appointment to see  Dr. Lorelei Pont.

## 2015-06-25 NOTE — Telephone Encounter (Signed)
Left message for Mr. Soloway to return my call.  

## 2015-07-15 ENCOUNTER — Telehealth: Payer: Self-pay | Admitting: Internal Medicine

## 2015-07-15 MED ORDER — FLUTICASONE-SALMETEROL 100-50 MCG/DOSE IN AEPB: INHALATION_SPRAY | RESPIRATORY_TRACT | Status: DC

## 2015-07-15 NOTE — Telephone Encounter (Signed)
Spoke with pt, requesting advair 100 samples.  Advised that we do not have samples of this dosage at this time.  rx called in to preferred pharmacy at pt's request.  Nothing further needed.

## 2015-10-30 ENCOUNTER — Ambulatory Visit (INDEPENDENT_AMBULATORY_CARE_PROVIDER_SITE_OTHER): Payer: BC Managed Care – PPO | Admitting: Internal Medicine

## 2015-10-30 ENCOUNTER — Encounter: Payer: Self-pay | Admitting: Internal Medicine

## 2015-10-30 VITALS — BP 128/86 | HR 51 | Ht 71.0 in | Wt 279.0 lb

## 2015-10-30 DIAGNOSIS — G4733 Obstructive sleep apnea (adult) (pediatric): Secondary | ICD-10-CM

## 2015-10-30 DIAGNOSIS — J4540 Moderate persistent asthma, uncomplicated: Secondary | ICD-10-CM | POA: Diagnosis not present

## 2015-10-30 MED ORDER — ALBUTEROL SULFATE HFA 108 (90 BASE) MCG/ACT IN AERS: INHALATION_SPRAY | RESPIRATORY_TRACT | Status: DC

## 2015-10-30 NOTE — Assessment & Plan Note (Signed)
We discussed medication refills. He seems to be doing very well currently.

## 2015-10-30 NOTE — Progress Notes (Signed)
Subjective:    Patient ID: Carlos Day, male    DOB: 09-Apr-1956, 60 y.o.   MRN: [id number]  HPI 09/19/10- 64 yoM followed for OSA, asthma. Last here- Sep 20, 2009- since then needed shoulder injected but no other major health issues. Denies bad asthma spells. Does note a little wheeze occ with rain. Continues Advair twice daily. Usually only needs rescue inhaler if cuts grass or muggy weather. Sees no problems with his lisinopril BP med.  CPAP 11 uses irregular- says he may miss a night per week. He can tell if he doesn't wear it. Has lost track of his [facility]  08/28/11- 55 yoM followed for OSA, asthma. Wears CPAP approx 5 hours at night; pressure okay on machine. Slight wheezing with increased pollen Needs Ventolin HFA RX for 90 days Comfortable with CPAP at [place]. He is convinced that helps Asthma control has been good with minor wheeze during spring pollen. Daily rescue inhaler once or twice.  10/25/12- 26 yoM followed for OSA, asthma. follows for:  wears CPAP 11/ [facility] approx 4-5 hours at night.  finds that he takes the mask off while he is sleeping.  he thinks that the mask fit is ok.   Only occasional rescue inhaler.  10/26/13- 72 yoM followed for OSA, asthma. FOLLOWS FOR: feels his CPAP11/ [facility] is losing pressure, still using every night x5hrs.  he has been very compliant with CPAP, but feels that over the last 2 weeks machine sounds different and does not holding pressure well. Asthma has been controlled but he asks sample Advair   10/27/14- 63 yoM followed for OSA, asthma FOLLOW FOR: wears CPAP 11/ [facility] machine every night, approx. 4-5 hours.  Needs CPAP supplies Feels very comfortable with his CPAP at current settings. Machine is not yet 59 years old. We discussed timing, maintenance and supplies. Admits some shortness of breath in humid weather. Discussed cost of Advair. Asthma does not wake him from  sleep. Rare need for rescue inhaler.  10/30/2015-60 year old male never smoker followed for OSA, asthma, complicated by HBP [facility]AP [id number] FOLLOWS FOR: Uses CPAP on average 4-5 hours nightly. Mask fits great. Needs CPAP supplies Says he wakes spontaneously after 5 hours at most and is rarely sleepy during the daytime. Occasional nap. He reports great asthma control. Occasionally uses rescue inhaler but no sleep disturbance.  ROS-see HPI Constitutional:   No-   weight loss, night sweats, fevers, chills, fatigue, lassitude. HEENT:   No-  headaches, difficulty swallowing, tooth/dental problems, sore throat,       No-  sneezing, itching, ear ache, nasal congestion, post nasal drip,  CV:  No-   chest pain, orthopnea, PND, swelling in lower extremities, anasarca, dizziness, palpitations Resp: No-   shortness of breath with exertion or at rest.              No-   productive cough,  No non-productive cough,  No- coughing up of blood.              No-   change in color of mucus.  +little wheezing.   Skin: No-   rash or lesions. GI:  No-   heartburn, indigestion, abdominal pain, nausea, vomiting,  GU: . MS:  No-   joint pain or swelling. . Neuro-     nothing unusual Psych:  No- change in mood or affect. No depression or anxiety.  No memory loss.  OBJ- Physical Exam General- Alert, Oriented, Affect-appropriate,  Distress- none acute, + overweight Skin- rash-none, lesions- none, excoriation- none Lymphadenopathy- none Head- atraumatic            Eyes- Gross vision intact, PERRLA, conjunctivae and secretions clear            Ears- Hearing, canals-normal            Nose- Clear, no-Septal dev, mucus, polyps, erosion, perforation             Throat- Mallampati III , mucosa clear , drainage- none, tonsils- atrophic Neck- flexible , trachea midline, no stridor , thyroid nl, carotid no bruit Chest - symmetrical excursion , unlabored           Heart/CV- RRR , no murmur , no gallop  , no  rub, nl s1 s2                           - JVD- none , edema- none, stasis changes- none, varices- none           Lung- clear to P&A, wheeze- none, cough- none , dullness-none, rub- none           Chest wall-  Abd-  Br/ Gen/ Rectal- Not done, not indicated Extrem- cyanosis- none, clubbing, none, atrophy- none, strength- nl Neuro- grossly intact to observation

## 2015-10-30 NOTE — Patient Instructions (Signed)
Script sent refilling albuterol rescue inhaler for occasional use for asthma when needed  Continue Advair  Order- DME [place]- request CPAP pressure compliance download   Dx OSA  Try to use CPAP all the time when you are sleeping.  Please call if we can help

## 2015-10-30 NOTE — Assessment & Plan Note (Signed)
He describes good compliance and control with CPAP and a short habitual sleep time. We discussed compliance and control goals. Plan-request download for documentation of compliance and control. Continue pressure at 11. Encourage him to keep his weight down.

## 2015-11-28 ENCOUNTER — Telehealth: Payer: Self-pay | Admitting: Internal Medicine

## 2015-11-28 NOTE — Telephone Encounter (Signed)
Spoke with the pt  I advised that we will leave a sample, but he needs to call ins and ask about alternatives  He verbalized understanding and will do this  I have left 1 sample up front for pick up

## 2015-11-28 NOTE — Telephone Encounter (Signed)
CDY, please advise if okay to provide advair samples, thanks

## 2015-11-28 NOTE — Telephone Encounter (Signed)
Ok to give 1 sample of his Advair. Please help him check with his insurance to see if dulera, symbicort, Breo or AirDuo would be cheaper for him.

## 2016-01-30 ENCOUNTER — Other Ambulatory Visit: Payer: Self-pay | Admitting: Family Medicine

## 2016-05-25 ENCOUNTER — Telehealth: Payer: Self-pay | Admitting: Family Medicine

## 2016-05-25 ENCOUNTER — Telehealth: Payer: Self-pay

## 2016-05-25 NOTE — Telephone Encounter (Signed)
Patient received a robo call about the flu shot.Patient said he didn't get the flu shot because after he has the flu shot he gets a bad headache.

## 2016-05-25 NOTE — Telephone Encounter (Signed)
Attempted to reach pt regarding flu vaccine. LVMM on home phone. Requested pt contact office if he has already received vaccine.

## 2016-05-25 NOTE — Telephone Encounter (Signed)
Not sure why Carlos Day received a call because his Flu Vaccine has already been postponed for this year.

## 2016-07-14 ENCOUNTER — Encounter: Payer: BC Managed Care – PPO | Admitting: Family Medicine

## 2016-07-22 ENCOUNTER — Other Ambulatory Visit: Payer: Self-pay | Admitting: Internal Medicine

## 2016-07-29 ENCOUNTER — Telehealth: Payer: Self-pay | Admitting: Internal Medicine

## 2016-07-29 MED ORDER — FLUTICASONE-SALMETEROL 100-50 MCG/DOSE IN AEPB: 1.0000 | INHALATION_SPRAY | Freq: Two times a day (BID) | RESPIRATORY_TRACT | 0 refills | Status: DC

## 2016-07-29 NOTE — Telephone Encounter (Signed)
Pt requesting advair samples.  These have been left up front for pt.  Nothing further needed.

## 2016-09-04 ENCOUNTER — Encounter: Payer: BC Managed Care – PPO | Admitting: Family Medicine

## 2016-09-08 ENCOUNTER — Encounter: Payer: BC Managed Care – PPO | Admitting: Family Medicine

## 2016-09-08 DIAGNOSIS — Z0289 Encounter for other administrative examinations: Secondary | ICD-10-CM

## 2016-09-25 ENCOUNTER — Other Ambulatory Visit: Payer: Self-pay | Admitting: Family Medicine

## 2016-09-28 ENCOUNTER — Telehealth: Payer: Self-pay | Admitting: Internal Medicine

## 2016-09-28 NOTE — Telephone Encounter (Signed)
Patient called requesting samples of Advair 100. Advised patient that we do not have samples of that medication. Patient verbalized understanding. Nothing further needed.

## 2016-10-27 ENCOUNTER — Telehealth: Payer: Self-pay | Admitting: Internal Medicine

## 2016-10-27 NOTE — Telephone Encounter (Signed)
Due to [facility] website maintenance, patient was asked to bring CPAP machine with cord or SD card to appointment on 7/5 at 330. Pt states he will bring his machine. Nothing further is needed.

## 2016-10-29 ENCOUNTER — Ambulatory Visit: Payer: BC Managed Care – PPO | Admitting: Internal Medicine

## 2016-11-03 ENCOUNTER — Encounter: Payer: Self-pay | Admitting: Internal Medicine

## 2016-11-03 ENCOUNTER — Ambulatory Visit (INDEPENDENT_AMBULATORY_CARE_PROVIDER_SITE_OTHER): Payer: BC Managed Care – PPO | Admitting: Internal Medicine

## 2016-11-03 VITALS — BP 142/84 | HR 58 | Ht 71.0 in | Wt 304.8 lb

## 2016-11-03 DIAGNOSIS — J4530 Mild persistent asthma, uncomplicated: Secondary | ICD-10-CM | POA: Diagnosis not present

## 2016-11-03 DIAGNOSIS — J4540 Moderate persistent asthma, uncomplicated: Secondary | ICD-10-CM

## 2016-11-03 DIAGNOSIS — G4733 Obstructive sleep apnea (adult) (pediatric): Secondary | ICD-10-CM | POA: Diagnosis not present

## 2016-11-03 MED ORDER — FLUTICASONE-SALMETEROL 100-50 MCG/DOSE IN AEPB: 1.0000 | INHALATION_SPRAY | Freq: Two times a day (BID) | RESPIRATORY_TRACT | 12 refills | Status: DC

## 2016-11-03 MED ORDER — FLUTICASONE-SALMETEROL 100-50 MCG/DOSE IN AEPB: 1.0000 | INHALATION_SPRAY | Freq: Two times a day (BID) | RESPIRATORY_TRACT | 0 refills | Status: DC

## 2016-11-03 NOTE — Assessment & Plan Note (Signed)
He is not recognizing symptoms of persistent asthma without exacerbation. I discussed this and discussed medications recommending regular twice daily use of his Advair 100 before we try anything else. He complains about the cost of Advair and $50 per month for him. Plan-sample and coupons for Advair if available. Consider nebulizer machine

## 2016-11-03 NOTE — Patient Instructions (Signed)
Suggest you try to use the Advair maintenance inhaler twice daily most days  Sample Advair 100  Or 200 if available     Inhale 1 puff then rinse mouth, twice daily  Continue CPAP 11/ [facility], mask of choice, humidifier, supplies, AirView    Dx OSA  Order- Office spirometry     Dx asthma mild persistent

## 2016-11-03 NOTE — Assessment & Plan Note (Signed)
We discussed CPAP compliance goals. There was remains the best treatment for him but we did discuss available oral appliances. Plan-continue CPAP 11

## 2016-11-03 NOTE — Progress Notes (Signed)
Subjective:    Patient ID: Carlos Day, male    DOB: 08-29-1955, 61 y.o.   MRN: [id number]  HPI male never smoker followed for OSA, asthma, complicated by HBP NPSG 8/84/16  AHI 99.3/ hr, desaturation to 72%,    Body weight 260 lbs Office Spirometry 11/01/16-moderately severe obstructive airways disease with restriction of exhaled volume. FVC 2.96/60%, FEV1 2.14/57%, ratio 0.7 to, FEF 25-75% 1.50/49% -----------------------------------------------------------------------  10/30/2015-61 year old male never smoker followed for OSA, asthma, complicated by HBP CPAP 60/[id number] FOLLOWS FOR: Uses CPAP on average 4-5 hours nightly. Mask fits great. Needs CPAP supplies Says he wakes spontaneously after 5 hours at most and is rarely sleepy during the daytime. Occasional nap. He reports great asthma control. Occasionally uses rescue inhaler but no sleep disturbance.  11/03/16- 61 year old male never smoker followed for OSA, asthma, complicated by HBP CPAP 93/[facility] FOLLOWS FOR:DME: [facility]. Pt is questioning pressure-feels like too much at times and then not enough at times. DL attached.  CPAP download 73% 4 hour compliance, AHI 3.0 per hour. He says it works well. Sleep time varies some. Admits he feels better with CPAP. Asthma control has been good. Rescue inhaler 0 to twice daily. Tries to save money by using Advair 100 only once a day when he can. Triggers include humidity, grasp being cut. Wheezing does not wake him. Office Spirometry 11/01/16-moderately severe obstructive airways disease with restriction of exhaled volume. FVC 2.96/60%, FEV1 2.14/57%, ratio 0.7 to, FEF 25-75% 1.50/49%  ROS-see HPI Constitutional:   No-   weight loss, night sweats, fevers, chills, fatigue, lassitude. HEENT:   No-  headaches, difficulty swallowing, tooth/dental problems, sore throat,       No-  sneezing, itching, ear ache, nasal congestion, post nasal drip,  CV:  No-   chest pain,  orthopnea, PND, swelling in lower extremities, anasarca, dizziness, palpitations Resp: No-   shortness of breath with exertion or at rest.              No-   productive cough,  No non-productive cough,  No- coughing up of blood.              No-   change in color of mucus.  +little wheezing.   Skin: No-   rash or lesions. GI:  No-   heartburn, indigestion, abdominal pain, nausea, vomiting,  GU: . MS:  No-   joint pain or swelling. . Neuro-     nothing unusual Psych:  No- change in mood or affect. No depression or anxiety.  No memory loss.  OBJ- Physical Exam General- Alert, Oriented, Affect-appropriate, Distress- none acute, + overweight Skin- rash-none, lesions- none, excoriation- none Lymphadenopathy- none Head- atraumatic            Eyes- Gross vision intact, PERRLA, conjunctivae and secretions clear            Ears- Hearing, canals-normal            Nose- Clear, no-Septal dev, mucus, polyps, erosion, perforation             Throat- Mallampati III , mucosa clear , drainage- none, tonsils- atrophic Neck- flexible , trachea midline, no stridor , thyroid nl, carotid no bruit Chest - symmetrical excursion , unlabored           Heart/CV- RRR , no murmur , no gallop  , no rub, nl s1 s2                           -  JVD- none , edema- none, stasis changes- none, varices- none           Lung- + unlabored wheeze + slight, cough- none , dullness-none, rub- none           Chest wall-  Abd-  Br/ Gen/ Rectal- Not done, not indicated Extrem- cyanosis- none, clubbing, none, atrophy- none, strength- nl Neuro- grossly intact to observation

## 2016-11-10 ENCOUNTER — Other Ambulatory Visit: Payer: Self-pay | Admitting: Family Medicine

## 2016-11-13 ENCOUNTER — Encounter: Payer: BC Managed Care – PPO | Admitting: Family Medicine

## 2016-11-17 ENCOUNTER — Ambulatory Visit (INDEPENDENT_AMBULATORY_CARE_PROVIDER_SITE_OTHER): Payer: BC Managed Care – PPO | Admitting: Family Medicine

## 2016-11-17 ENCOUNTER — Encounter: Payer: Self-pay | Admitting: Family Medicine

## 2016-11-17 VITALS — BP 120/84 | HR 60 | Temp 98.4°F | Ht 69.5 in | Wt 303.8 lb

## 2016-11-17 DIAGNOSIS — Z23 Encounter for immunization: Secondary | ICD-10-CM

## 2016-11-17 DIAGNOSIS — M25562 Pain in left knee: Secondary | ICD-10-CM | POA: Diagnosis not present

## 2016-11-17 DIAGNOSIS — R7303 Prediabetes: Secondary | ICD-10-CM

## 2016-11-17 DIAGNOSIS — E8881 Metabolic syndrome: Secondary | ICD-10-CM | POA: Diagnosis not present

## 2016-11-17 DIAGNOSIS — Z125 Encounter for screening for malignant neoplasm of prostate: Secondary | ICD-10-CM

## 2016-11-17 DIAGNOSIS — J4540 Moderate persistent asthma, uncomplicated: Secondary | ICD-10-CM | POA: Diagnosis not present

## 2016-11-17 DIAGNOSIS — M25561 Pain in right knee: Secondary | ICD-10-CM | POA: Diagnosis not present

## 2016-11-17 DIAGNOSIS — I10 Essential (primary) hypertension: Secondary | ICD-10-CM

## 2016-11-17 DIAGNOSIS — Z0000 Encounter for general adult medical examination without abnormal findings: Secondary | ICD-10-CM

## 2016-11-17 MED ORDER — DICLOFENAC SODIUM 1 % TD GEL
4.0000 g | Freq: Four times a day (QID) | TRANSDERMAL | 3 refills | Status: DC
Start: 2016-11-17 — End: 2018-04-26

## 2016-11-17 NOTE — Addendum Note (Signed)
Addended by: Carter Kitten on: 11/17/2016 04:44 PM   Modules accepted: Orders

## 2016-11-17 NOTE — Assessment & Plan Note (Signed)
Encouraged exercise, weight loss, healthy eating habits.  Counseld on ways to begin weight loss as well as associated complications.

## 2016-11-17 NOTE — Progress Notes (Signed)
Subjective:    Patient ID: Carlos Day, male    DOB: 1955-12-22, 61 y.o.   MRN: [id number]  HPI The patient is here for annual wellness exam and preventative care.    Moderate persistent asthma: Good control on Advair  Using albuterol prn, occ flares with heat and yard work. Followed by Asthma doctor.  Hypertension:   Good control on lisinopril HCTZ BP Readings from Last 3 Encounters:  11/17/16 120/84  11/03/16 (!) 142/84  10/30/15 128/86  Using medication without problems or lightheadedness:  none Chest pain with exertion: none Edema:none Short of breath: none Average home BPs: Other issues: metabolic syndrome. Due for labs for risk factor eval.. Including chol and A1C.  Morbid Obesity Body mass index is 44.21 kg/m.   Left knee pain: using voltaren gel. Helps with pain some.  Better in winter. Runs a buffer on floors in summer.. Could be worsening it.   He is not very active, walking some. Moderate diet. Drinks soda Wt Readings from Last 3 Encounters:  11/17/16 (!) 303 lb 12 oz (137.8 kg)  11/03/16 (!) 304 lb 12.8 oz (138.3 kg)  10/30/15 279 lb (126.6 kg)    Blood pressure 120/84, pulse 60, temperature 98.4 F (36.9 C), temperature source Oral, height 5' 9.5" (1.765 m), weight (!) 303 lb 12 oz (137.8 kg). Social History /Family History/Past Medical History reviewed in detail and updated in EMR if needed.  Review of Systems     Objective:   Physical Exam  Constitutional: He appears well-developed and well-nourished.  Non-toxic appearance. He does not appear ill. No distress.  Morbidly obese  HENT:  Head: Normocephalic and atraumatic.  Right Ear: Hearing, tympanic membrane, external ear and ear canal normal.  Left Ear: Hearing, tympanic membrane, external ear and ear canal normal.  Nose: Nose normal.  Mouth/Throat: Uvula is midline, oropharynx is clear and moist and mucous membranes are normal.  Eyes: Pupils are equal, round, and reactive to light. Conjunctivae,  EOM and lids are normal. Lids are everted and swept, no foreign bodies found.  Neck: Trachea normal, normal range of motion and phonation normal. Neck supple. Carotid bruit is not present. No thyroid mass and no thyromegaly present.  Cardiovascular: Normal rate, regular rhythm, S1 normal, S2 normal, intact distal pulses and normal pulses.  Exam reveals no gallop.   No murmur heard. Pulmonary/Chest: Breath sounds normal. He has no wheezes. He has no rhonchi. He has no rales.  Abdominal: Soft. Normal appearance and bowel sounds are normal. There is no hepatosplenomegaly. There is no tenderness. There is no rebound, no guarding and no CVA tenderness. No hernia.  Lymphadenopathy:    He has no cervical adenopathy.  Neurological: He is alert. He has normal strength and normal reflexes. No cranial nerve deficit or sensory deficit. Gait normal.  Skin: Skin is warm, dry and intact. No rash noted.  Psychiatric: He has a normal mood and affect. His speech is normal and behavior is normal. Judgment normal.          Assessment & Plan:  The patient's preventative maintenance and recommended screening tests for an annual wellness exam were reviewed in full today. Brought up to date unless services declined.  Counselled on the importance of diet, exercise, and its role in overall health and mortality. The patient's FH and SH was reviewed, including their home life, tobacco status, and drug and alcohol status.   Colon: no family colon cancer. Last colon in 2014, seessile polyp Dr. Eugenia Pancoast, repeat was  due 2024. Due for re-check PSA. Nonsmoker Vaccine: uptodate Td, intolerant of flu,  Due for PNA vaccine. HIV: done  hep C: done

## 2016-11-17 NOTE — Patient Instructions (Addendum)
Please stop at the lab to have labs drawn. Get back on track with low fat diet, decrease animal fats, increase exercise.  Change soda/ sweet tea to water.

## 2016-11-17 NOTE — Assessment & Plan Note (Signed)
Due for re-eval of A1C.

## 2016-11-17 NOTE — Assessment & Plan Note (Signed)
Followed by pulmonary. Stable control.. Encouraged to take Adviar BID to avoid flares.

## 2016-11-17 NOTE — Assessment & Plan Note (Signed)
Using voltaren gel prn .

## 2016-11-17 NOTE — Assessment & Plan Note (Signed)
Well controlled. Continue current medication.  Due for lab eval.

## 2016-11-18 ENCOUNTER — Encounter: Payer: Self-pay | Admitting: Family Medicine

## 2016-11-18 ENCOUNTER — Encounter: Payer: Self-pay | Admitting: *Deleted

## 2016-11-18 ENCOUNTER — Telehealth: Payer: Self-pay | Admitting: Family Medicine

## 2016-11-18 DIAGNOSIS — E7800 Pure hypercholesterolemia, unspecified: Secondary | ICD-10-CM | POA: Insufficient documentation

## 2016-11-18 DIAGNOSIS — E119 Type 2 diabetes mellitus without complications: Secondary | ICD-10-CM

## 2016-11-18 DIAGNOSIS — E1169 Type 2 diabetes mellitus with other specified complication: Secondary | ICD-10-CM | POA: Insufficient documentation

## 2016-11-18 LAB — COMPREHENSIVE METABOLIC PANEL
ALT: 42 U/L (ref 0–53)
AST: 31 U/L (ref 0–37)
Albumin: 4.2 g/dL (ref 3.5–5.2)
Alkaline Phosphatase: 57 U/L (ref 39–117)
BUN: 12 mg/dL (ref 6–23)
CO2: 32 mEq/L (ref 19–32)
Calcium: 9.9 mg/dL (ref 8.4–10.5)
Chloride: 103 mEq/L (ref 96–112)
Creatinine, Ser: 0.98 mg/dL (ref 0.40–1.50)
GFR: 82.83 mL/min (ref >60.00)
Glucose, Bld: 95 mg/dL (ref 70–99)
Potassium: 4.1 mEq/L (ref 3.5–5.1)
Sodium: 139 mEq/L (ref 135–145)
Total Bilirubin: 0.4 mg/dL (ref 0.2–1.2)
Total Protein: 6.9 g/dL (ref 6.0–8.3)

## 2016-11-18 LAB — LIPID PANEL
Cholesterol: 185 mg/dL (ref 0–200)
HDL: 35.5 mg/dL — ABNORMAL LOW (ref >39.00)
NonHDL: 149.02
Total CHOL/HDL Ratio: 5
Triglycerides: 213 mg/dL — ABNORMAL HIGH (ref 0.0–149.0)
VLDL: 42.6 mg/dL — ABNORMAL HIGH (ref 0.0–40.0)

## 2016-11-18 LAB — HEMOGLOBIN A1C: Hgb A1c MFr Bld: 6.7 % — ABNORMAL HIGH (ref 4.6–6.5)

## 2016-11-18 LAB — LDL CHOLESTEROL, DIRECT: Direct LDL: 125 mg/dL

## 2016-11-18 LAB — PSA: PSA: 0.52 ng/mL (ref 0.10–4.00)

## 2016-11-18 NOTE — Telephone Encounter (Signed)
-----   Message from Carter Kitten, [place] sent at 11/18/2016  4:50 PM EDT ----- Carlos Day notified as instructed by telephone.  He is agreeable to the nutritionist referral.  Follow up appointment scheduled for fasting labs 02/12/2017 at 8:05 am and 02/16/17 at 11:00 am with Dr. Diona Browner  Letter with results also mailed to patient.  Will forward to Dr. Diona Browner to place referral for nutritionist.

## 2016-11-19 NOTE — Telephone Encounter (Signed)
Pt left v/m requesting cb about nutrition sheet sent to pt.

## 2016-11-19 NOTE — Telephone Encounter (Signed)
Spoke with Mr. Stockhausen.  He states the nutritionist called him and they will not be able to see him until Mid-August.  He is requesting the handoust, we talked about yesterday on th phone, until he can get in with nutritionist.  Handout on How to Lower High Cholesterol and [facility] Low Glycemic Diet Plan faxed to patient at [phone] as requested.

## 2016-12-24 ENCOUNTER — Other Ambulatory Visit: Payer: Self-pay | Admitting: Family Medicine

## 2016-12-31 ENCOUNTER — Telehealth: Payer: Self-pay | Admitting: Internal Medicine

## 2016-12-31 NOTE — Telephone Encounter (Signed)
lmtcb X1 for pt. We do not have any Advair 100 samples at this time.

## 2017-01-01 NOTE — Telephone Encounter (Signed)
LMTCB

## 2017-01-04 NOTE — Telephone Encounter (Signed)
Called and spoke to pt. Informed him that we have no samples of Advair at this time. Advised pt that we can have pt complete the pt assistance forms, pt states he would like these mailed to him. Forms have been placed in the outgoing mail. Pt verbalized understanding and denied any further questions or concerns at this time.

## 2017-02-12 ENCOUNTER — Telehealth: Payer: Self-pay | Admitting: Family Medicine

## 2017-02-12 ENCOUNTER — Other Ambulatory Visit (INDEPENDENT_AMBULATORY_CARE_PROVIDER_SITE_OTHER): Payer: BC Managed Care – PPO

## 2017-02-12 DIAGNOSIS — E119 Type 2 diabetes mellitus without complications: Secondary | ICD-10-CM

## 2017-02-12 LAB — HEMOGLOBIN A1C: Hgb A1c MFr Bld: 6.2 % (ref 4.6–6.5)

## 2017-02-12 NOTE — Telephone Encounter (Signed)
-----   Message from Ellamae Sia sent at 02/03/2017 11:16 AM EDT ----- Regarding: Lab orders for Friday, 10.19.18 Lab orders for DM appt

## 2017-02-16 ENCOUNTER — Encounter: Payer: Self-pay | Admitting: Family Medicine

## 2017-02-16 ENCOUNTER — Ambulatory Visit (INDEPENDENT_AMBULATORY_CARE_PROVIDER_SITE_OTHER): Payer: BC Managed Care – PPO | Admitting: Family Medicine

## 2017-02-16 VITALS — BP 120/80 | HR 56 | Temp 98.7°F | Ht 69.5 in | Wt 268.5 lb

## 2017-02-16 DIAGNOSIS — E119 Type 2 diabetes mellitus without complications: Secondary | ICD-10-CM | POA: Diagnosis not present

## 2017-02-16 DIAGNOSIS — Z23 Encounter for immunization: Secondary | ICD-10-CM | POA: Diagnosis not present

## 2017-02-16 DIAGNOSIS — I10 Essential (primary) hypertension: Secondary | ICD-10-CM

## 2017-02-16 NOTE — Assessment & Plan Note (Signed)
Improving control with weight loss and lifestyle changes.

## 2017-02-16 NOTE — Assessment & Plan Note (Signed)
Well controlled. Continue current medication.  

## 2017-02-16 NOTE — Addendum Note (Signed)
Addended by: Carter Kitten on: 02/16/2017 02:45 PM   Modules accepted: Orders

## 2017-02-16 NOTE — Patient Instructions (Addendum)
Keep up the good work on healthy eating and regular exercise!

## 2017-02-16 NOTE — Progress Notes (Signed)
   Subjective:    Patient ID: Carlos Day, male    DOB: 1955/08/23, 61 y.o.   MRN: [id number]  HPI    61 year old male presents for follow up DM   Diabetes:  Good control with diet changes. He has been Lab Results  Component Value Date   HGBA1C 6.2 02/12/2017   Feet problems: none Blood Sugars averaging: not checking. eye exam within last year: due  He has lost 35 lbs in last 3 months!   Wt Readings from Last 3 Encounters:  02/16/17 268 lb 8 oz (121.8 kg)  11/17/16 (!) 303 lb 12 oz (137.8 kg)  11/03/16 (!) 304 lb 12.8 oz (138.3 kg)      Det compliance: Has been to nutritionist, decreased fried foods. Has started cinnamon tablet. Decreasing portion size, more veggies. Stopped soda. Exercise: he has started back to walking 15-20 min a day. Other complaints:  Hypertension:   Good control on ACEI.  Using medication without problems or lightheadedness:  none Chest pain with exertion: none Edema: none Short of breath: Average home BPs: good Other issues:   Social History /Family History/Past Medical History reviewed in detail and updated in EMR if needed. Blood pressure 120/80, pulse (!) 56, temperature 98.7 F (37.1 C), temperature source Oral, height 5' 9.5" (1.765 m), weight 268 lb 8 oz (121.8 kg).    Review of Systems  Constitutional: Negative for fatigue and fever.  HENT: Negative for ear pain.   Eyes: Negative for pain.  Respiratory: Negative for cough and shortness of breath.   Cardiovascular: Negative for chest pain, palpitations and leg swelling.  Gastrointestinal: Negative for abdominal pain.  Genitourinary: Negative for dysuria.  Musculoskeletal: Negative for arthralgias.  Neurological: Negative for syncope, light-headedness and headaches.  Psychiatric/Behavioral: Negative for dysphoric mood.       Objective:   Physical Exam  Constitutional: Vital signs are normal. He appears well-developed and well-nourished.  HENT:  Head: Normocephalic.  Right  Ear: Hearing normal.  Left Ear: Hearing normal.  Nose: Nose normal.  Mouth/Throat: Oropharynx is clear and moist and mucous membranes are normal.  Neck: Trachea normal. Carotid bruit is not present. No thyroid mass and no thyromegaly present.  Cardiovascular: Normal rate, regular rhythm and normal pulses.  Exam reveals no gallop, no distant heart sounds and no friction rub.   No murmur heard. No peripheral edema  Pulmonary/Chest: Effort normal and breath sounds normal. No respiratory distress.  Skin: Skin is warm, dry and intact. No rash noted.  Psychiatric: He has a normal mood and affect. His speech is normal and behavior is normal. Thought content normal.      Diabetic foot exam: Normal inspection No skin breakdown No calluses  Normal DP pulses Normal sensation to light touch and monofilament Nails normal     Assessment & Plan:

## 2017-04-07 ENCOUNTER — Telehealth: Payer: Self-pay | Admitting: Internal Medicine

## 2017-04-07 NOTE — Telephone Encounter (Signed)
Spoke with patient. Advised him that we do not have samples of Advair at this time. Advised him to call back later. He verbalized understanding. Nothing else needed at time of call.

## 2017-04-14 ENCOUNTER — Telehealth: Payer: Self-pay | Admitting: Internal Medicine

## 2017-04-14 NOTE — Telephone Encounter (Signed)
Spoke with pt. He is requesting samples of Advair. Samples have been left up front for pick up. Nothing further was needed.

## 2017-08-05 ENCOUNTER — Telehealth: Payer: Self-pay | Admitting: Family Medicine

## 2017-08-05 DIAGNOSIS — E7800 Pure hypercholesterolemia, unspecified: Secondary | ICD-10-CM

## 2017-08-05 DIAGNOSIS — E119 Type 2 diabetes mellitus without complications: Secondary | ICD-10-CM

## 2017-08-05 NOTE — Telephone Encounter (Signed)
-----   Message from Lendon Collar, RT sent at 08/04/2017 10:27 AM EDT ----- Regarding: Lab appt 08/12/17 Please enter lab orders for 08/12/17. Thanks-Lauren

## 2017-08-10 ENCOUNTER — Other Ambulatory Visit: Payer: Self-pay | Admitting: Family Medicine

## 2017-08-12 ENCOUNTER — Other Ambulatory Visit (INDEPENDENT_AMBULATORY_CARE_PROVIDER_SITE_OTHER): Payer: BC Managed Care – PPO

## 2017-08-12 DIAGNOSIS — E119 Type 2 diabetes mellitus without complications: Secondary | ICD-10-CM

## 2017-08-12 LAB — COMPREHENSIVE METABOLIC PANEL
ALT: 35 U/L (ref 0–53)
AST: 31 U/L (ref 0–37)
Albumin: 4.1 g/dL (ref 3.5–5.2)
Alkaline Phosphatase: 52 U/L (ref 39–117)
BUN: 15 mg/dL (ref 6–23)
CO2: 31 mEq/L (ref 19–32)
Calcium: 9.4 mg/dL (ref 8.4–10.5)
Chloride: 102 mEq/L (ref 96–112)
Creatinine, Ser: 0.99 mg/dL (ref 0.40–1.50)
GFR: 81.66 mL/min (ref >60.00)
Glucose, Bld: 92 mg/dL (ref 70–99)
Potassium: 4 mEq/L (ref 3.5–5.1)
Sodium: 137 mEq/L (ref 135–145)
Total Bilirubin: 0.5 mg/dL (ref 0.2–1.2)
Total Protein: 6.7 g/dL (ref 6.0–8.3)

## 2017-08-12 LAB — LIPID PANEL
Cholesterol: 147 mg/dL (ref 0–200)
HDL: 40.1 mg/dL (ref >39.00)
LDL Cholesterol: 96 mg/dL (ref 0–99)
NonHDL: 106.61
Total CHOL/HDL Ratio: 4
Triglycerides: 54 mg/dL (ref 0.0–149.0)
VLDL: 10.8 mg/dL (ref 0.0–40.0)

## 2017-08-12 LAB — HEMOGLOBIN A1C: Hgb A1c MFr Bld: 6.2 % (ref 4.6–6.5)

## 2017-08-13 ENCOUNTER — Other Ambulatory Visit: Payer: BC Managed Care – PPO

## 2017-08-17 ENCOUNTER — Encounter: Payer: Self-pay | Admitting: Family Medicine

## 2017-08-17 ENCOUNTER — Ambulatory Visit (INDEPENDENT_AMBULATORY_CARE_PROVIDER_SITE_OTHER): Payer: BC Managed Care – PPO | Admitting: Family Medicine

## 2017-08-17 VITALS — BP 149/92 | HR 48 | Temp 98.3°F | Ht 69.5 in | Wt 268.0 lb

## 2017-08-17 DIAGNOSIS — R079 Chest pain, unspecified: Secondary | ICD-10-CM | POA: Diagnosis not present

## 2017-08-17 DIAGNOSIS — E7800 Pure hypercholesterolemia, unspecified: Secondary | ICD-10-CM

## 2017-08-17 DIAGNOSIS — I10 Essential (primary) hypertension: Secondary | ICD-10-CM

## 2017-08-17 DIAGNOSIS — E119 Type 2 diabetes mellitus without complications: Secondary | ICD-10-CM | POA: Diagnosis not present

## 2017-08-17 LAB — HM DIABETES FOOT EXAM

## 2017-08-17 NOTE — Assessment & Plan Note (Signed)
Improved control with lifestyle. Encouraged exercise, weight loss, healthy eating habits.  

## 2017-08-17 NOTE — Patient Instructions (Addendum)
Set up yearly eye exam.  keep working on lifestyle changes, increase exercise and weight loss.

## 2017-08-17 NOTE — Progress Notes (Signed)
   Subjective:    Patient ID: Carlos Day, male    DOB: 09/29/1955, 62 y.o.   MRN: [id number]  HPI    62 year old male presents for 6 month follow up of DM.   Diabetes:   Excellent control with diet. Lab Results  Component Value Date   HGBA1C 6.2 08/12/2017  Using medications without difficulties: Hypoglycemic episodes: Hyperglycemic episodes: Feet problems: none Blood Sugars averaging: not checking eye exam within last year: due   Hypertension:   Borderline control  In office today on lisinopril HCTZ  BP Readings from Last 3 Encounters:  08/17/17 (!) 134/92  02/16/17 120/80  11/17/16 120/84  Using medication without problems or lightheadedness:  none Chest pain with exertion:   Chest pain after eating (13min after eating)heartburn ? with artificial sweetner..  Changed to decaf and stopped splenda.. Improved. No issue in last week.  Last 2-3 minutes, no exertional component, no SOB. Edema:none Short of breath: none Average home BPs: not checking Other issues:  Elevated Cholesterol:  LDL at goal < 100 Lab Results  Component Value Date   CHOL 147 08/12/2017   HDL 40.10 08/12/2017   LDLCALC 96 08/12/2017   LDLDIRECT 125.0 11/17/2016   TRIG 54.0 08/12/2017   CHOLHDL 4 08/12/2017  Using medications without problems: Muscle aches:  Diet compliance: good. Exercise: stretching, walking daily. Other complaints:   Stable Body mass index is 39.01 kg/m.  Wt Readings from Last 3 Encounters:  08/17/17 268 lb (121.6 kg)  02/16/17 268 lb 8 oz (121.8 kg)  11/17/16 (!) 303 lb 12 oz (137.8 kg)     Social History /Family History/Past Medical History reviewed in detail and updated in EMR if needed. Blood pressure (!) 134/92, pulse (!) 50, temperature 98.3 F (36.8 C), temperature source Oral, height 5' 9.5" (1.765 m), weight 268 lb (121.6 kg), SpO2 99 %.   Review of Systems  Constitutional: Negative for fatigue and fever.  HENT: Negative for ear pain.   Eyes: Negative for  pain.  Respiratory: Negative for cough and shortness of breath.   Cardiovascular: Negative for chest pain, palpitations and leg swelling.  Gastrointestinal: Negative for abdominal pain.  Genitourinary: Negative for dysuria.  Musculoskeletal: Negative for arthralgias.  Neurological: Negative for syncope, light-headedness and headaches.  Psychiatric/Behavioral: Negative for dysphoric mood.       Objective:   Physical Exam  Constitutional: Vital signs are normal. He appears well-developed and well-nourished.  HENT:  Head: Normocephalic.  Right Ear: Hearing normal.  Left Ear: Hearing normal.  Nose: Nose normal.  Mouth/Throat: Oropharynx is clear and moist and mucous membranes are normal.  Neck: Trachea normal. Carotid bruit is not present. No thyroid mass and no thyromegaly present.  Cardiovascular: Normal rate, regular rhythm and normal pulses. Exam reveals no gallop, no distant heart sounds and no friction rub.  No murmur heard. No peripheral edema  Pulmonary/Chest: Effort normal and breath sounds normal. No respiratory distress.  Skin: Skin is warm, dry and intact. No rash noted.  Psychiatric: He has a normal mood and affect. His speech is normal and behavior is normal. Thought content normal.          Assessment & Plan:

## 2017-08-17 NOTE — Assessment & Plan Note (Signed)
Follow at home.. Previously at goal on current meds. Improved on recheck in office.

## 2017-08-17 NOTE — Assessment & Plan Note (Signed)
Great control with lifestyle changes.

## 2017-08-17 NOTE — Assessment & Plan Note (Signed)
Improving with lifestyle changes. 

## 2017-08-17 NOTE — Assessment & Plan Note (Addendum)
Likely due to  GERD, now resolved.  EKG unremarkable today. Bradycardia likely asymptomatic EKG: normal EKG, normal sinus rhythm, bradycardia unchanged from previous tracings.

## 2017-08-20 ENCOUNTER — Telehealth: Payer: Self-pay | Admitting: Internal Medicine

## 2017-08-20 NOTE — Telephone Encounter (Signed)
Spoke with pt, advised we do not have any advair samples. Nothing further is needed.

## 2017-08-20 NOTE — Telephone Encounter (Signed)
Pt is returning call. Cb is [phone].

## 2017-08-20 NOTE — Telephone Encounter (Signed)
We do not have Advair samples  ATC pt, no answer. Left message for pt to call back.

## 2017-08-23 ENCOUNTER — Telehealth: Payer: Self-pay

## 2017-08-23 NOTE — Telephone Encounter (Signed)
Amy Aaron Edelman pharmacist from [facility] diabetic teaching program needs pt most recent A!C. Info given.

## 2017-11-04 ENCOUNTER — Ambulatory Visit: Payer: BC Managed Care – PPO | Admitting: Internal Medicine

## 2017-11-19 ENCOUNTER — Other Ambulatory Visit: Payer: Self-pay | Admitting: Family Medicine

## 2017-12-08 ENCOUNTER — Other Ambulatory Visit: Payer: Self-pay | Admitting: Internal Medicine

## 2017-12-10 ENCOUNTER — Telehealth: Payer: Self-pay | Admitting: Internal Medicine

## 2017-12-10 NOTE — Telephone Encounter (Signed)
ATC Patient.  Left message to call back. 

## 2017-12-10 NOTE — Telephone Encounter (Signed)
I called pt to advise him that we do not have samples of Advair. LM to call back.

## 2017-12-10 NOTE — Telephone Encounter (Signed)
Pt is returning call. Pt missed 84mo ROV and could not get a refill at the pharm for Advair because it was expired. Cb is [phone].

## 2017-12-14 NOTE — Telephone Encounter (Signed)
Attempted to call patient today. I did not receive an answer at time of call. I have left a voicemail message for pt to return call. X2

## 2017-12-15 MED ORDER — FLUTICASONE-SALMETEROL 100-50 MCG/DOSE IN AEPB: 1.0000 | INHALATION_SPRAY | Freq: Two times a day (BID) | RESPIRATORY_TRACT | 3 refills | Status: DC

## 2017-12-15 MED ORDER — FLUTICASONE-SALMETEROL 100-50 MCG/DOSE IN AEPB: 1.0000 | INHALATION_SPRAY | Freq: Two times a day (BID) | RESPIRATORY_TRACT | 0 refills | Status: DC

## 2017-12-15 NOTE — Telephone Encounter (Signed)
Patient requesting work note to be faxed to: [phone]: Attn: Dr. Koren Bound, [facility]

## 2017-12-15 NOTE — Telephone Encounter (Signed)
Called patient unable to reach left message to give us a call back.

## 2017-12-15 NOTE — Telephone Encounter (Signed)
Patient returned phone call ; pt contact # [phone]

## 2017-12-15 NOTE — Telephone Encounter (Signed)
Ok to give thee note and refill Advair until next seen.

## 2017-12-15 NOTE — Telephone Encounter (Signed)
Spoke with pt, advised Rx sent in and note faxed. Pt understood and nothing further is needed.

## 2017-12-15 NOTE — Telephone Encounter (Signed)
Dr. Annamaria Boots has left for the day. I sent one Rx to his pharmacy and wrote note for work.

## 2017-12-15 NOTE — Telephone Encounter (Signed)
Spoke with Carlos Day, he states he has to leave work early due to SOB. He has been out of the Advair for a week. He would like a note to cover his  Absence for 8/21-8/22. Can we send Rx for Advair into his pharmacy. It was probably denied due to him not being here for follow up in a year. He has an appt but not until September with CY. Please advise on Rx and note for work.   Fax number [phone] Attn: Dr. Koren Bound, [employer]   Current Outpatient Medications on File Prior to Visit  Medication Sig Dispense Refill  . albuterol (VENTOLIN HFA) 108 (90 Base) MCG/ACT inhaler INHALE 2 PUFFS INTO THE LUNGS EVERY 4 (FOUR) HOURS AS NEEDED FOR WHEEZING OR SHORTNESS OF BREATH. 18 g 12  . aspirin 81 MG tablet Take 81 mg by mouth daily.      . diclofenac sodium (VOLTAREN) 1 % GEL Apply 4 g topically 4 (four) times daily. 100 g 3  . Fluticasone-Salmeterol (ADVAIR DISKUS) 100-50 MCG/DOSE AEPB Inhale 1 puff into the lungs 2 (two) times daily. Rinse mouth 1 each 12  . lisinopril-hydrochlorothiazide (PRINZIDE,ZESTORETIC) 20-25 MG tablet Take 1 tablet by mouth daily. SCHEDULE PHYSICAL EXAM 30 tablet 2  . Multiple Vitamins-Minerals (MULTIVITAMIN WITH MINERALS) tablet Take 1 tablet by mouth daily.    . sildenafil (REVATIO) 20 MG tablet Take 1-5 tablets (20-100 mg total) by mouth daily as needed. 50 tablet 0   No current facility-administered medications on file prior to visit.   \ No Known Allergies

## 2017-12-15 NOTE — Telephone Encounter (Signed)
Pt is returning call. Pt states that because he does not have Advair yet he is having to leave work early. Pt is requesting a doctor's note to explain that he has asthma and needs his inhaler. Cb is [phone].

## 2018-03-07 ENCOUNTER — Encounter: Payer: Self-pay | Admitting: Internal Medicine

## 2018-03-07 ENCOUNTER — Ambulatory Visit (INDEPENDENT_AMBULATORY_CARE_PROVIDER_SITE_OTHER): Payer: BC Managed Care – PPO | Admitting: Internal Medicine

## 2018-03-07 VITALS — BP 144/82 | HR 60 | Ht 71.0 in | Wt 271.2 lb

## 2018-03-07 DIAGNOSIS — G4733 Obstructive sleep apnea (adult) (pediatric): Secondary | ICD-10-CM

## 2018-03-07 DIAGNOSIS — J4540 Moderate persistent asthma, uncomplicated: Secondary | ICD-10-CM | POA: Diagnosis not present

## 2018-03-07 MED ORDER — LEVALBUTEROL HCL 0.63 MG/3ML IN NEBU
0.6300 mg | INHALATION_SOLUTION | Freq: Once | RESPIRATORY_TRACT | Status: AC
  Administered 2018-03-07: 0.63 mg via RESPIRATORY_TRACT

## 2018-03-07 MED ORDER — COMPRESSOR/NEBULIZER MISC: 0 refills | Status: AC

## 2018-03-07 MED ORDER — ALBUTEROL SULFATE (2.5 MG/3ML) 0.083% IN NEBU: 2.5000 mg | INHALATION_SOLUTION | Freq: Four times a day (QID) | RESPIRATORY_TRACT | 12 refills | Status: DC | PRN

## 2018-03-07 NOTE — Patient Instructions (Addendum)
We will try to get a tier exception so that you can get Advair cheaper and continue to use it twice every day.  Scripts printed for  Compressor nebulizer and albuterol neb solution.  Nebulize 1 ampule every 6 hours as needed  Try hard to use your CPAP at least 4 hours per night, so it does you the most good.  Please call if we can help

## 2018-03-07 NOTE — Progress Notes (Signed)
Subjective:    Patient ID: Carlos Day, male    DOB: [birth year], 62 y.o.   MRN: [id number]  HPI male never smoker followed for OSA, asthma, complicated by HBP NPSG 1/44/81  AHI 99.3/ hr, desaturation to 72%,    Body weight 260 lbs Office Spirometry 11/01/16-moderately severe obstructive airways disease with restriction of exhaled volume. FVC 2.96/60%, FEV1 2.14/57%, ratio 0.7 to, FEF 25-75% 1.50/49% ----------------------------------------------------------------------- 11/03/16- 62 year old male never smoker followed for OSA, asthma, complicated by HBP CPAP 85[facility] FOLLOWS FOR:DME: [employer]. Pt is questioning pressure-feels like too much at times and then not enough at times. DL attached.  CPAP download 73% 4 hour compliance, AHI 3.0 per hour. He says it works well. Sleep time varies some. Admits he feels better with CPAP. Asthma control has been good. Rescue inhaler 0 to twice daily. Tries to save money by using Advair 100 only once a day when he can. Triggers include humidity, grass being cut. Wheezing does not wake him. Office Spirometry 11/01/16-moderately severe obstructive airways disease with restriction of exhaled volume. FVC 2.96/60%, FEV1 2.14/57%, ratio 0.7 to, FEF 25-75% 1.50/49%  03/07/2018- 62 year old male never smoker followed for OSA, asthma, complicated by HBP CPAP 63[facility] -----OSA: [facility]. Pt wears CPAP nightly for about 3 hours. Pressure works well and no new supplies needed at this time. DL attached.  Download 10% 4-hour compliance AHI 0.5/hour.  He wears CPAP every night and says he just does not sleep longer at one time.  He is not putting it on for naps-which I emphasized. He says the mask comes off in his sleep. Advair too expensive.  We discussed alternatives and can get him a nebulizer machine which should be cheaper.  ROS-see HPI + = positive Constitutional:   No-   weight loss, night sweats, fevers, chills,  fatigue, lassitude. HEENT:   No-  headaches, difficulty swallowing, tooth/dental problems, sore throat,       No-  sneezing, itching, ear ache, nasal congestion, post nasal drip,  CV:  No-   chest pain, orthopnea, PND, swelling in lower extremities, anasarca, dizziness, palpitations Resp: +   shortness of breath with exertion or at rest.              No-   productive cough,  No non-productive cough,  No- coughing up of blood.              No-   change in color of mucus.  +little wheezing.   Skin: No-   rash or lesions. GI:  No-   heartburn, indigestion, abdominal pain, nausea, vomiting,  GU: . MS:  No-   joint pain or swelling. . Neuro-     nothing unusual Psych:  No- change in mood or affect. No depression or anxiety.  No memory loss.  OBJ- Physical Exam General- Alert, Oriented, Affect-appropriate, Distress- none acute, + overweight Skin- rash-none, lesions- none, excoriation- none Lymphadenopathy- none Head- atraumatic            Eyes- Gross vision intact, PERRLA, conjunctivae and secretions clear            Ears- Hearing, canals-normal            Nose- Clear, no-Septal dev, mucus, polyps, erosion, perforation             Throat- Mallampati III , mucosa clear , drainage- none, tonsils- atrophic Neck- flexible , trachea midline, no stridor , thyroid nl, carotid no bruit Chest - symmetrical excursion , unlabored  Heart/CV- RRR , no murmur , no gallop  , no rub, nl s1 s2                           - JVD- none , edema- none, stasis changes- none, varices- none           Lung- + unlabored wheeze + slight, cough- none , dullness-none, rub- none           Chest wall-  Abd-  Br/ Gen/ Rectal- Not done, not indicated Extrem- cyanosis- none, clubbing, none, atrophy- none, strength- nl Neuro- grossly intact to observation

## 2018-03-08 ENCOUNTER — Other Ambulatory Visit: Payer: Self-pay | Admitting: Family Medicine

## 2018-03-10 ENCOUNTER — Ambulatory Visit: Payer: BC Managed Care – PPO | Admitting: Family Medicine

## 2018-03-23 ENCOUNTER — Encounter (HOSPITAL_COMMUNITY): Payer: Self-pay | Admitting: Emergency Medicine

## 2018-03-23 ENCOUNTER — Ambulatory Visit (HOSPITAL_COMMUNITY)
Admission: EM | Admit: 2018-03-23 | Discharge: 2018-03-23 | Disposition: A | Discharge status: Home / Self Care | Payer: BC Managed Care – PPO | Attending: Family Medicine | Admitting: Family Medicine

## 2018-03-23 DIAGNOSIS — J069 Acute upper respiratory infection, unspecified: Secondary | ICD-10-CM

## 2018-03-23 DIAGNOSIS — B9789 Other viral agents as the cause of diseases classified elsewhere: Secondary | ICD-10-CM

## 2018-03-23 DIAGNOSIS — I10 Essential (primary) hypertension: Secondary | ICD-10-CM

## 2018-03-23 DIAGNOSIS — J4521 Mild intermittent asthma with (acute) exacerbation: Secondary | ICD-10-CM

## 2018-03-23 DIAGNOSIS — R062 Wheezing: Secondary | ICD-10-CM

## 2018-03-23 MED ORDER — HYDROCODONE-HOMATROPINE 5-1.5 MG/5ML PO SYRP: 5.0000 mL | ORAL_SOLUTION | Freq: Four times a day (QID) | ORAL | 0 refills | Status: DC | PRN

## 2018-03-23 MED ORDER — PREDNISONE 10 MG (21) PO TBPK: ORAL_TABLET | Freq: Every day | ORAL | 0 refills | Status: DC

## 2018-03-23 NOTE — Discharge Instructions (Signed)
Be aware, your cough medication may cause drowsiness. Please do not drive, operate heavy machinery or make important decisions while on this medication, it can cloud your judgement.  Follow up with your primary care doctor or here if you are not seeing improvement of your symptoms over the next several days, sooner if you feel you are worsening.  Caring for yourself: Get plenty of rest. Drink plenty of fluids, enough so that your urine is light yellow or clear like water. If you have kidney, heart, or liver disease and have to limit fluids, talk with your doctor before you increase the amount of fluids you drink. Take an over-the-counter pain medicine if needed, such as acetaminophen (Tylenol), ibuprofen (Advil, Motrin), or naproxen (Aleve), to relieve fever, headache, and muscle aches. Read and follow all instructions on the label. No one younger than 20 should take aspirin. It has been linked to Reye syndrome, a serious illness. Before you use over the counter cough and cold medicines, check the label. These medicines may not be safe for children younger than age 6 or for people with certain health problems. If the skin around your nose and lips becomes sore, put some petroleum jelly on the area.  Avoid spreading a viral illness: Wash your hands regularly, and keep your hands away from your face.  Stay home from school, work, and other public places until you are feeling better and your fever has been gone for at least 24 hours. The fever needs to have gone away on its own without the help of medicine.  

## 2018-03-23 NOTE — ED Triage Notes (Signed)
Pt here for URI sx and body aches  

## 2018-03-25 NOTE — ED Provider Notes (Signed)
[facility]   [id number] 03/23/18 Arrival Time: 1914  ASSESSMENT & PLAN:  1. Viral URI with cough   2. Wheezing   3. Mild intermittent asthma with acute exacerbation   4. Uncontrolled hypertension    Meds ordered this encounter  Medications  . HYDROcodone-homatropine (HYCODAN) 5-1.5 MG/5ML syrup    Sig: Take 5 mLs by mouth every 6 (six) hours as needed for cough.    Dispense:  90 mL    Refill:  0  . predniSONE (STERAPRED UNI-PAK 21 TAB) 10 MG (21) TBPK tablet    Sig: Take by mouth daily. Take as directed.    Dispense:  21 tablet    Refill:  0   Cough medication sedation precautions. Discussed typical duration of symptoms. OTC symptom care as needed. Ensure adequate fluid intake and rest. May f/u with PCP or here as needed.  Increased BP noted at visit. He hasn't taken his medication today. Will monitor closely. Has inhaler to use as needed. No current respiratory distress this evening. No need for urgent ED evaluation.  Reviewed expectations re: course of current medical issues. Questions answered. Outlined signs and symptoms indicating need for more acute intervention. Patient verbalized understanding. After Visit Summary given.   SUBJECTIVE: History from: patient.  Carlos Day is a 62 y.o. male who presents with complaint of nasal congestion, post-nasal drainage, and a persistent dry cough. Onset abrupt, 1-2. Overall with fatigue and with body aches. SOB: none. Wheezing: at times, esp with coughing. Fever: questions subjective with chills. Overall normal PO intake without n/v. Sick contacts: no. No specific or significant aggravating or alleviating factors reported. OTC treatment: none reported. No headaches, visual changes, CP reported. Ambulatory without difficulty. No LE edema.  Received flu shot this year: no.  Social History   Tobacco Use  Smoking Status Never Smoker  Smokeless Tobacco Never Used   ROS: As per HPI. All other systems negative.    OBJECTIVE:  Vitals:   03/23/18 1925  BP: (!) 198/99  Pulse: 65  Resp: 18  Temp: 99 F (37.2 C)  TempSrc: Oral  SpO2: 95%    BP noted and addressed with pt.  General appearance: alert; appears very fatigued; obese HEENT: nasal congestion; clear runny nose; throat irritation secondary to post-nasal drainage Neck: supple without LAD CV: RRR Lungs: unlabored respirations, symmetrical air entry; mild bilateral expiratory wheezing; cough: moderate Abd: soft; non-tender Skin: warm and dry Neuro: normal gait Ext: no LE edema Psychological: alert and cooperative; normal mood and affect   No Known Allergies  Past Medical History:  Diagnosis Date  . Asthma   . Sleep apnea   . Unspecified essential hypertension    Family History  Problem Relation Age of Onset  . Breast cancer Mother   . Hypertension Brother   . Prostate cancer Father   . Colon cancer Neg Hx   . Esophageal cancer Neg Hx   . Rectal cancer Neg Hx   . Stomach cancer Neg Hx    Social History   Socioeconomic History  . Marital status: Married    Spouse name: Not on file  . Number of children: Not on file  . Years of education: Not on file  . Highest education level: Not on file  Occupational History  . Occupation: yard Work  Scientific laboratory technician  . Financial resource strain: Not on file  . Food insecurity:    Worry: Not on file    Inability: Not on file  . Transportation needs:  Medical: Not on file    Non-medical: Not on file  Tobacco Use  . Smoking status: Never Smoker  . Smokeless tobacco: Never Used  Substance and Sexual Activity  . Alcohol use: No    Alcohol/week: 0.0 standard drinks  . Drug use: No  . Sexual activity: Not on file  Lifestyle  . Physical activity:    Days per week: Not on file    Minutes per session: Not on file  . Stress: Not on file  Relationships  . Social connections:    Talks on phone: Not on file    Gets together: Not on file    Attends religious service: Not on  file    Active member of club or organization: Not on file    Attends meetings of clubs or organizations: Not on file    Relationship status: Not on file  . Intimate partner violence:    Fear of current or ex partner: Not on file    Emotionally abused: Not on file    Physically abused: Not on file    Forced sexual activity: Not on file  Other Topics Concern  . Not on file  Social History Narrative  . Not on file           Vanessa Kick, MD 03/25/18 [phone]

## 2018-03-31 ENCOUNTER — Telehealth: Payer: Self-pay

## 2018-03-31 ENCOUNTER — Ambulatory Visit (INDEPENDENT_AMBULATORY_CARE_PROVIDER_SITE_OTHER): Payer: BC Managed Care – PPO | Admitting: Family Medicine

## 2018-03-31 ENCOUNTER — Telehealth: Payer: Self-pay | Admitting: Internal Medicine

## 2018-03-31 ENCOUNTER — Encounter: Payer: Self-pay | Admitting: Family Medicine

## 2018-03-31 VITALS — BP 126/70 | HR 54 | Temp 98.1°F | Ht 69.5 in | Wt 274.5 lb

## 2018-03-31 DIAGNOSIS — J4540 Moderate persistent asthma, uncomplicated: Secondary | ICD-10-CM

## 2018-03-31 DIAGNOSIS — J209 Acute bronchitis, unspecified: Secondary | ICD-10-CM | POA: Diagnosis not present

## 2018-03-31 MED ORDER — AMOXICILLIN-POT CLAVULANATE 875-125 MG PO TABS: 1.0000 | ORAL_TABLET | Freq: Two times a day (BID) | ORAL | 0 refills | Status: DC

## 2018-03-31 MED ORDER — HYDROCODONE-HOMATROPINE 5-1.5 MG/5ML PO SYRP: 5.0000 mL | ORAL_SOLUTION | Freq: Four times a day (QID) | ORAL | 0 refills | Status: DC | PRN

## 2018-03-31 NOTE — Progress Notes (Signed)
Subjective:    Patient ID: Carlos Day, male    DOB: 14-Mar-1956, 62 y.o.   MRN: [id number]  HPI  Here for uri symptoms  Seen at [facility] on 11/27   Diagnosed with viral uri /cough and wheezing and exac of asthma  Px prednisone 21 pack (finished)  Also hycodan (ran out)   62 yo pt of Dr Diona Browner with hx of asthma   Pulse ox is 97% on RA today   Still has a headache Prod cough - cannot get it all out /using mucinex that helps Green and yellow phlegm   Wheezing is off and on - some improvement in that  No fever or chills  Chest is sore from coughing   Still congested - some sinus pressure  Blowing out green/yellow Throat is ok   Using rescue albuterol mdi bid avg  advair - uses regularly   Patient Active Problem List   Diagnosis Date Noted  . Acute bronchitis 03/31/2018  . Chest pain 08/17/2017  . High cholesterol 11/18/2016  . Morbid obesity ([place]) 11/17/2016  . Bilateral knee pain 05/27/2015  . Asthma with acute exacerbation 08/28/2014  . Diabetes mellitus type II, controlled, with no complications ([place]) 08/65/7846  . Metabolic syndrome 96/29/5284  . ROTATOR CUFF SYNDROME, LEFT 10/09/2009  . 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 RHINITIS, ALLERGIC NOS 02/09/2007  . Essential hypertension, benign 01/24/2007  . Obstructive sleep apnea 01/17/2007  . Asthma, moderate persistent 01/17/2007   Past Medical History:  Diagnosis Date  . Asthma   . Sleep apnea   . Unspecified essential hypertension    Past Surgical History:  Procedure Laterality Date  . COLONOSCOPY     Social History   Tobacco Use  . Smoking status: Never Smoker  . Smokeless tobacco: Never Used  Substance Use Topics  . Alcohol use: No    Alcohol/week: 0.0 standard drinks  . Drug use: No   Family History  Problem Relation Age of Onset  . Breast cancer Mother   . Hypertension Brother   . Prostate cancer Father   . Colon cancer Neg Hx   . Esophageal cancer Neg Hx   . Rectal cancer Neg Hx   . Stomach  cancer Neg Hx    No Known Allergies Current Outpatient Medications on File Prior to Visit  Medication Sig Dispense Refill  . albuterol (PROVENTIL) (2.5 MG/3ML) 0.083% nebulizer solution Take 3 mLs (2.5 mg total) by nebulization every 6 (six) hours as needed for wheezing or shortness of breath. 75 mL 12  . albuterol (VENTOLIN HFA) 108 (90 Base) MCG/ACT inhaler INHALE 2 PUFFS INTO THE LUNGS EVERY 4 (FOUR) HOURS AS NEEDED FOR WHEEZING OR SHORTNESS OF BREATH. 18 g 12  . aspirin 81 MG tablet Take 81 mg by mouth daily.      . diclofenac sodium (VOLTAREN) 1 % GEL Apply 4 g topically 4 (four) times daily. 100 g 3  . Fluticasone-Salmeterol (ADVAIR DISKUS) 100-50 MCG/DOSE AEPB Inhale 1 puff into the lungs 2 (two) times daily. Rinse mouth 1 each 3  . lisinopril-hydrochlorothiazide (PRINZIDE,ZESTORETIC) 20-25 MG tablet TAKE ONE TABLET BY MOUTH DAILY *NEED TO SCHEDULE PHYSICAL EXAM* 30 tablet 1  . Multiple Vitamins-Minerals (MULTIVITAMIN WITH MINERALS) tablet Take 1 tablet by mouth daily.    . Nebulizers (COMPRESSOR/NEBULIZER) MISC Use as directed 1 each 0  . sildenafil (REVATIO) 20 MG tablet Take 1-5 tablets (20-100 mg total) by mouth daily as needed. 50 tablet 0   No current facility-administered medications on file  prior to visit.     Review of Systems  Constitutional: Positive for appetite change and fatigue. Negative for fever.  HENT: Positive for postnasal drip, rhinorrhea, sinus pressure and sore throat. Negative for congestion, ear pain and sneezing.   Eyes: Negative for pain and discharge.  Respiratory: Positive for cough and wheezing. Negative for shortness of breath and stridor.   Cardiovascular: Negative for chest pain.  Gastrointestinal: Negative for diarrhea, nausea and vomiting.  Genitourinary: Negative for frequency, hematuria and urgency.  Musculoskeletal: Negative for arthralgias and myalgias.  Skin: Negative for rash.  Neurological: Positive for headaches. Negative for dizziness,  weakness and light-headedness.  Psychiatric/Behavioral: Negative for confusion and dysphoric mood.       Objective:   Physical Exam  Constitutional: He appears well-developed and well-nourished. No distress.  obese and well appearing   HENT:  Head: Normocephalic and atraumatic.  Right Ear: External ear normal.  Left Ear: External ear normal.  Mouth/Throat: Oropharynx is clear and moist.  Nares are injected and congested  No sinus tenderness Clear rhinorrhea and post nasal drip   Eyes: Pupils are equal, round, and reactive to light. Conjunctivae and EOM are normal. Right eye exhibits no discharge. Left eye exhibits no discharge.  Neck: Normal range of motion. Neck supple.  Cardiovascular: Normal rate and normal heart sounds.  Pulmonary/Chest: Effort normal. No stridor. No respiratory distress. He has wheezes. He has no rales. He exhibits no tenderness.  Harsh bs  Scattered rhonchi Scant wheeze on forced esp only  Some upper airway sounds   Lymphadenopathy:    He has no cervical adenopathy.  Neurological: He is alert.  Skin: Skin is warm and dry. No rash noted.  Psychiatric: He has a normal mood and affect.          Assessment & Plan:   Problem List Items Addressed This Visit      Respiratory   Acute bronchitis    S/p tx at [facility] 11/27 with prednisone and hycodan  Bronchospasm is improved but still has cough prod of colored sputum (over 2 wk)  Px augmentin to take as directed  Refilled hycodan for cough with caution of sedation  Rest /fluids Disc symptomatic care - see instructions on AVS   Update if not starting to improve in a week or if worsening  -esp if inc in wheezing

## 2018-03-31 NOTE — Telephone Encounter (Signed)
Pt seen [facility] on 03/23/18; pt has finished prednisone and is no better and requesting abx. Pt has prod cough with green phlegm; wheezing and some SOB; S/T; ? Fever, pt has not taken temp. Pt on his way to [place] now so cannot ck temp. Pt scheduled appt with Dr Glori Bickers 03/31/18 at 11:45.

## 2018-03-31 NOTE — Assessment & Plan Note (Signed)
S/p tx at [facility] 11/27 with prednisone and hycodan  Bronchospasm is improved but still has cough prod of colored sputum (over 2 wk)  Px augmentin to take as directed  Refilled hycodan for cough with caution of sedation  Rest /fluids Disc symptomatic care - see instructions on AVS   Update if not starting to improve in a week or if worsening  -esp if inc in wheezing

## 2018-03-31 NOTE — Telephone Encounter (Signed)
I will see him then

## 2018-03-31 NOTE — Telephone Encounter (Signed)
Called and spoke to patient, patient states he was supposed to receive a nebulizer machine.   PCC's I see the order but I don't see any documentation. Has this been sent to [facility] in [place].

## 2018-03-31 NOTE — Patient Instructions (Signed)
Drink lots of fluids and rest  Take the augmentin as directed for bronchitis   Continue asthma medicines  I refilled cough medicine (caution of sedation)   Update if not starting to improve in a week or if worsening  -especially if wheezing worsens or fever

## 2018-04-01 NOTE — Telephone Encounter (Signed)
  Return in about 6 months (around 09/05/2018).  We will try to get a tier exception so that you can get Advair cheaper and continue to use it twice every day.  Scripts printed for  Compressor nebulizer and albuterol neb solution.  Nebulize 1 ampule every 6 hours as needed  Try hard to use your CPAP at least 4 hours per night, so it does you the most good.  Please call if we can help     Pt was given Rx's to take to local DME of his choice to get nebulizer machine if he could not obtain one through his local pharmacy where he was to get the nebulizer solution as well.  Spoke with patient-he recalled receiving the Rx's at his OV. He was confused on where to take the nebulizer Rx and neb med Rx. Pt aware that I have sent order to [facility] at his request for nebulizer machine and will take Rx for neb med to his local pharmacy. Nothing more needed at this time.

## 2018-04-01 NOTE — Telephone Encounter (Signed)
Looks like the patient had been using [employer]. On Dr. Janee Morn 03/07/18 note  Scripts printed for  Compressor nebulizer and albuterol neb solution.  Nebulize 1 ampule every 6 hours as needed. I don't see there was an actual order placed for neb machine or where the scripts were suppose to be sent.

## 2018-04-18 ENCOUNTER — Telehealth: Payer: Self-pay | Admitting: Family Medicine

## 2018-04-18 ENCOUNTER — Other Ambulatory Visit (INDEPENDENT_AMBULATORY_CARE_PROVIDER_SITE_OTHER): Payer: BC Managed Care – PPO

## 2018-04-18 DIAGNOSIS — Z125 Encounter for screening for malignant neoplasm of prostate: Secondary | ICD-10-CM

## 2018-04-18 DIAGNOSIS — E119 Type 2 diabetes mellitus without complications: Secondary | ICD-10-CM

## 2018-04-18 DIAGNOSIS — E7800 Pure hypercholesterolemia, unspecified: Secondary | ICD-10-CM

## 2018-04-18 LAB — COMPREHENSIVE METABOLIC PANEL
ALT: 30 U/L (ref 0–53)
AST: 20 U/L (ref 0–37)
Albumin: 4.1 g/dL (ref 3.5–5.2)
Alkaline Phosphatase: 51 U/L (ref 39–117)
BUN: 11 mg/dL (ref 6–23)
CO2: 31 mEq/L (ref 19–32)
Calcium: 9.8 mg/dL (ref 8.4–10.5)
Chloride: 101 mEq/L (ref 96–112)
Creatinine, Ser: 0.9 mg/dL (ref 0.40–1.50)
GFR: 90.95 mL/min (ref >60.00)
Glucose, Bld: 105 mg/dL — ABNORMAL HIGH (ref 70–99)
Potassium: 4.2 mEq/L (ref 3.5–5.1)
Sodium: 139 mEq/L (ref 135–145)
Total Bilirubin: 0.4 mg/dL (ref 0.2–1.2)
Total Protein: 6.5 g/dL (ref 6.0–8.3)

## 2018-04-18 LAB — LIPID PANEL
Cholesterol: 161 mg/dL (ref 0–200)
HDL: 44.7 mg/dL (ref >39.00)
LDL Cholesterol: 87 mg/dL (ref 0–99)
NonHDL: 116.02
Total CHOL/HDL Ratio: 4
Triglycerides: 144 mg/dL (ref 0.0–149.0)
VLDL: 28.8 mg/dL (ref 0.0–40.0)

## 2018-04-18 LAB — HEMOGLOBIN A1C: Hgb A1c MFr Bld: 6.4 % (ref 4.6–6.5)

## 2018-04-18 LAB — PSA: PSA: 0.56 ng/mL (ref 0.10–4.00)

## 2018-04-18 NOTE — Telephone Encounter (Signed)
-----   Message from Ellamae Sia sent at 04/11/2018 11:32 AM EST ----- Regarding: Lab orders for 12.23.19 Patient is scheduled for CPX labs, please order future labs, Thanks , Karna Christmas

## 2018-04-26 ENCOUNTER — Ambulatory Visit (INDEPENDENT_AMBULATORY_CARE_PROVIDER_SITE_OTHER): Payer: BC Managed Care – PPO | Admitting: Family Medicine

## 2018-04-26 VITALS — BP 130/90 | HR 58 | Temp 98.3°F | Ht 70.25 in | Wt 279.5 lb

## 2018-04-26 DIAGNOSIS — J4540 Moderate persistent asthma, uncomplicated: Secondary | ICD-10-CM

## 2018-04-26 DIAGNOSIS — E8881 Metabolic syndrome: Secondary | ICD-10-CM

## 2018-04-26 DIAGNOSIS — Z0000 Encounter for general adult medical examination without abnormal findings: Secondary | ICD-10-CM | POA: Diagnosis not present

## 2018-04-26 DIAGNOSIS — E7800 Pure hypercholesterolemia, unspecified: Secondary | ICD-10-CM

## 2018-04-26 DIAGNOSIS — E119 Type 2 diabetes mellitus without complications: Secondary | ICD-10-CM | POA: Diagnosis not present

## 2018-04-26 DIAGNOSIS — I10 Essential (primary) hypertension: Secondary | ICD-10-CM

## 2018-04-26 MED ORDER — PEN NEEDLES 30G X 8 MM MISC: 0.7500 mg | 11 refills | Status: AC

## 2018-04-26 MED ORDER — DULAGLUTIDE 0.75 MG/0.5ML ~~LOC~~ SOAJ: 0.7500 mg | SUBCUTANEOUS | 3 refills | Status: DC

## 2018-04-26 NOTE — Assessment & Plan Note (Signed)
Has not taken today. Good control when on med.  Continue current medication.

## 2018-04-26 NOTE — Assessment & Plan Note (Signed)
Improved control of chol.. LDL at goal < 100.

## 2018-04-26 NOTE — Progress Notes (Signed)
Subjective:     Patient ID: Carlos Day, male   DOB: 02-02-1956, 62 y.o.   MRN: [id number]  HPI The patient is here for annual wellness exam and preventative care.    Diabetes:   Good control with diet. Lab Results  Component Value Date   HGBA1C 6.4 04/18/2018  Using medications without difficulties: Hypoglycemic episodes: Hyperglycemic episodes: Feet problems: no ulcer Blood Sugars averaging: not checking eye exam within last year: due    Asthma moderate persistent: stable control on advair and albuterol prn  Elevated Cholesterol:  At goal LDL < 100 Lab Results  Component Value Date   CHOL 161 04/18/2018   HDL 44.70 04/18/2018   LDLCALC 87 04/18/2018   LDLDIRECT 125.0 11/17/2016   TRIG 144.0 04/18/2018   CHOLHDL 4 04/18/2018  Using medications without problems: Muscle aches:  Diet compliance: moderate... wants to try appetite suppressant Exercise: walking Other complaints:  Hypertension:   Almost at goal on current meds... lisinopril HCTZ... has not taken meds yet today BP Readings from Last 3 Encounters:  04/26/18 130/90  03/31/18 126/70  03/23/18 (!) 198/99  Using medication without problems or lightheadedness:  Chest pain with exertion: Edema: Short of breath: Average home BPs: Other issues:    Morbid obesity Body mass index is 39.82 kg/m. Wt Readings from Last 3 Encounters:  04/26/18 279 lb 8 oz (126.8 kg)  03/31/18 274 lb 8 oz (124.5 kg)  03/07/18 271 lb 3.2 oz (123 kg)    Social History /Family History/Past Medical History reviewed in detail and updated in EMR if needed. Blood pressure 130/90, pulse (!) 58, temperature 98.3 F (36.8 C), temperature source Oral, height 5' 10.25" (1.784 m), weight 279 lb 8 oz (126.8 kg).  Review of Systems  Constitutional: Negative for fatigue and fever.  HENT: Negative for ear pain.   Eyes: Negative for pain.  Respiratory: Negative for cough and shortness of breath.   Cardiovascular: Negative for chest pain,  palpitations and leg swelling.  Gastrointestinal: Negative for abdominal pain.  Genitourinary: Negative for dysuria.  Musculoskeletal: Negative for arthralgias.  Neurological: Negative for syncope, light-headedness and headaches.  Psychiatric/Behavioral: Negative for dysphoric mood.       Objective:   Physical Exam Constitutional:      Appearance: He is well-developed. He is obese.  HENT:     Head: Normocephalic.     Right Ear: Hearing normal.     Left Ear: Hearing normal.     Nose: Nose normal.  Neck:     Thyroid: No thyroid mass or thyromegaly.     Vascular: No carotid bruit.     Trachea: Trachea normal.  Cardiovascular:     Rate and Rhythm: Normal rate and regular rhythm.     Pulses: Normal pulses.     Heart sounds: Heart sounds not distant. No murmur. No friction rub. No gallop.      Comments: No peripheral edema Pulmonary:     Effort: Pulmonary effort is normal. No respiratory distress.     Breath sounds: Normal breath sounds.  Skin:    General: Skin is warm and dry.     Findings: No rash.  Psychiatric:        Speech: Speech normal.        Behavior: Behavior normal.        Thought Content: Thought content normal.        Assessment:      CPX    Plan:     The patient's preventative maintenance and  recommended screening tests for an annual wellness exam were reviewed in full today. Brought up to date unless services declined.  Counselled on the importance of diet, exercise, and its role in overall health and mortality. The patient's FH and SH was reviewed, including their home life, tobacco status, and drug and alcohol status.   Due for eye exam.  Refused flu vaccines given  intolerant, uptodate with td and PNA vaccines. Lab Results  Component Value Date   PSA 0.56 04/18/2018   PSA 0.52 11/17/2016   PSA 0.61 05/27/2015   Colon Ca [place]:  no family colon cancer. Last colon in 2014, sessile polyp Dr. Eugenia Pancoast, repeat was due 2024.  Hep C: done  HIV : done   nonsmoker

## 2018-04-26 NOTE — Patient Instructions (Addendum)
Due for yearly eye exam. Start trulictiry once weekly low dose. Get back on track with low carb diet, increase walking time or speed.

## 2018-04-26 NOTE — Assessment & Plan Note (Signed)
Discussed options for better control of DM as wekll as weight loss. We will start on trulicity. Follow up in 4 weeks.  Continue low carb diet and walking daily.

## 2018-04-26 NOTE — Assessment & Plan Note (Signed)
Encouraged exercise, weight loss, healthy eating habits. ? ?

## 2018-04-26 NOTE — Assessment & Plan Note (Signed)
Start on trulicty for weight loss and better A1C control.

## 2018-04-28 ENCOUNTER — Encounter: Payer: Self-pay | Admitting: Internal Medicine

## 2018-04-28 NOTE — Assessment & Plan Note (Signed)
Not sure if he is restless at night or has confusional arousals-pulling mask off.  He puts it on every night at bedtime and seems to benefit when he wears it. Plan-to new CPAP 11.  Education done.

## 2018-04-28 NOTE — Assessment & Plan Note (Signed)
We will request tier exception so he can continue to get Advair if possible. Plan compressor nebulizer with albuterol neb solution which should be cheaper for him as discussed.

## 2018-05-03 ENCOUNTER — Telehealth: Payer: Self-pay | Admitting: Internal Medicine

## 2018-05-03 DIAGNOSIS — J4540 Moderate persistent asthma, uncomplicated: Secondary | ICD-10-CM

## 2018-05-03 NOTE — Telephone Encounter (Signed)
  This order was supposed to be sent to [facility] but I didn't see the order. I resent the order to [facility] and advised pt. That they should be calling to set him up. Pt understood and nothing further is needed.      Return in about 6 months (around 09/05/2018).  We will try to get a tier exception so that you can get Advair cheaper and continue to use it twice every day.  Scripts printed for Compressor nebulizer and albuterol neb solution. Nebulize 1 ampule every 6 hours as needed  Try hard to use your CPAP at least 4 hours per night, so it does you the most good.  Please call if we can help     Pt was given Rx's to take to local DME of his choice to get nebulizer machine if he could not obtain one through his local pharmacy where he was to get the nebulizer solution as well.  Spoke with patient-he recalled receiving the Rx's at his OV. He was confused on where to take the nebulizer Rx and neb med Rx. Pt aware that I have sent order to [facility] at his request for nebulizer machine and will take Rx for neb med to his local pharmacy. Nothing more needed at this time.

## 2018-05-16 ENCOUNTER — Other Ambulatory Visit: Payer: Self-pay | Admitting: Family Medicine

## 2018-05-27 ENCOUNTER — Encounter: Payer: Self-pay | Admitting: Family Medicine

## 2018-05-27 ENCOUNTER — Ambulatory Visit: Payer: BC Managed Care – PPO | Admitting: Family Medicine

## 2018-05-27 DIAGNOSIS — E119 Type 2 diabetes mellitus without complications: Secondary | ICD-10-CM

## 2018-05-27 DIAGNOSIS — K5900 Constipation, unspecified: Secondary | ICD-10-CM | POA: Insufficient documentation

## 2018-05-27 DIAGNOSIS — K5903 Drug induced constipation: Secondary | ICD-10-CM | POA: Diagnosis not present

## 2018-05-27 MED ORDER — DULAGLUTIDE 0.75 MG/0.5ML ~~LOC~~ SOAJ: 1.5000 mg | SUBCUTANEOUS | 3 refills | Status: DC

## 2018-05-27 NOTE — Assessment & Plan Note (Signed)
Has started on weight loss. Tolerating trulicity..increase dose. Follow up in 4 weeks.  Continue increasing duration and frequency of exercise. Reviewed heart healthy diet.

## 2018-05-27 NOTE — Assessment & Plan Note (Signed)
Increase fiber and water in diet... this will also help with  weight loss.

## 2018-05-27 NOTE — Patient Instructions (Addendum)
Can use Benefiber or other fiber supplements.  Increase Trulicity  to 1.5 mg weekly.  Continue working on exercise and lifestlye changes.

## 2018-05-27 NOTE — Progress Notes (Signed)
   Subjective:    Patient ID: Carlos Day, male    DOB: Jul 27, 1955, 63 y.o.   MRN: [id number]  HPI 63 year old male presents for weight management.   Started on Trulicity 1 month ago  He has noted  Some increase  No SE otherwise.. no N/V. He has noted a small amount of decrease appetite.   No jittery or shaky.  Wt Readings from Last 3 Encounters:  05/27/18 275 lb 8 oz (125 kg)  04/26/18 279 lb 8 oz (126.8 kg)  03/31/18 274 lb 8 oz (124.5 kg)    Social History /Family History/Past Medical History reviewed in detail and updated in EMR if needed. Blood pressure 120/78, pulse 60, temperature 97.9 F (36.6 C), temperature source Oral, height 5' 10.25" (1.784 m), weight 275 lb 8 oz (125 kg).  Review of Systems  Constitutional: Negative for fatigue and fever.  HENT: Negative for ear pain.   Eyes: Negative for pain.  Respiratory: Negative for cough and shortness of breath.   Cardiovascular: Negative for chest pain, palpitations and leg swelling.  Gastrointestinal: Positive for constipation. Negative for abdominal pain.  Genitourinary: Negative for dysuria.  Musculoskeletal: Negative for arthralgias.  Neurological: Negative for syncope, light-headedness and headaches.  Psychiatric/Behavioral: Negative for dysphoric mood.       Objective:   Physical Exam Constitutional:      Appearance: He is well-developed. He is obese.  HENT:     Head: Normocephalic.     Right Ear: Hearing normal.     Left Ear: Hearing normal.     Nose: Nose normal.  Neck:     Thyroid: No thyroid mass or thyromegaly.     Vascular: No carotid bruit.     Trachea: Trachea normal.  Cardiovascular:     Rate and Rhythm: Normal rate and regular rhythm.     Pulses: Normal pulses.     Heart sounds: Heart sounds not distant. No murmur. No friction rub. No gallop.      Comments: No peripheral edema Pulmonary:     Effort: Pulmonary effort is normal. No respiratory distress.     Breath sounds: Normal breath  sounds.  Skin:    General: Skin is warm and dry.     Findings: No rash.  Psychiatric:        Speech: Speech normal.        Behavior: Behavior normal.        Thought Content: Thought content normal.           Assessment & Plan:

## 2018-06-28 ENCOUNTER — Ambulatory Visit: Payer: BC Managed Care – PPO | Admitting: Family Medicine

## 2018-07-19 ENCOUNTER — Telehealth: Payer: Self-pay | Admitting: Internal Medicine

## 2018-07-19 MED ORDER — FLUTICASONE-SALMETEROL 100-50 MCG/DOSE IN AEPB: 1.0000 | INHALATION_SPRAY | Freq: Two times a day (BID) | RESPIRATORY_TRACT | 3 refills | Status: DC

## 2018-07-19 NOTE — Telephone Encounter (Signed)
Called and spoke with patient, advised patient that we do not have any to give out at this time per protocol. Nothing further needed. Refill sent.

## 2018-08-01 ENCOUNTER — Telehealth: Payer: Self-pay | Admitting: Internal Medicine

## 2018-08-01 NOTE — Telephone Encounter (Signed)
FYI: Pt states Advair to expensive. Advised patient to call his insurance and find out what medication on formulary that is in same class as Advair. He is to call office back and let us know so that Dr.Young has something to select from. He has not picked up his nebulizer medication either.

## 2018-08-03 NOTE — Telephone Encounter (Signed)
Called and spoke with pt stating to him the two different choices of what he could do per [facility] in regards to generic advair and albuterol neb sol. Pt expressed understanding. Nothing further needed.

## 2018-08-03 NOTE — Telephone Encounter (Signed)
He can ask his drug store for generic Advair 100. He can see if it is cheaper. Also, he has a nebulizer machine with albuterol. Suggest he try using that 3 or 4 times daily instead of Advair.

## 2018-08-03 NOTE — Telephone Encounter (Signed)
Attempted to contact Kristopher Oppenheim [phone] but unable to speak with pharmacy and unable to leave a message due to high call volumes and disconnect when transferred to VM.  Attempt to inquire about patient cost of Advair and if there are deductibles creating high co-pay or if alternatives for cost decrease are available per pharmacy.

## 2018-09-06 ENCOUNTER — Ambulatory Visit: Payer: BC Managed Care – PPO | Admitting: Internal Medicine

## 2018-10-03 ENCOUNTER — Telehealth: Payer: Self-pay | Admitting: Internal Medicine

## 2018-10-03 NOTE — Telephone Encounter (Signed)
Returned call to patient re: cost of nebulizer is too expensive. He says he can't afford the monthly payment. Made patient aware he can purchase nebulizer offline which may be cheaper. Pt has appt with [facility] tommorow and was going to cancel because he didn't have nebulizer. Advised to keep appt. Nothing further needed.

## 2018-10-04 ENCOUNTER — Ambulatory Visit: Payer: BC Managed Care – PPO | Admitting: Internal Medicine

## 2018-10-26 ENCOUNTER — Ambulatory Visit: Payer: BC Managed Care – PPO | Admitting: Internal Medicine

## 2018-11-07 ENCOUNTER — Telehealth: Payer: Self-pay | Admitting: Internal Medicine

## 2018-11-07 MED ORDER — ALBUTEROL SULFATE (2.5 MG/3ML) 0.083% IN NEBU: 2.5000 mg | INHALATION_SOLUTION | Freq: Four times a day (QID) | RESPIRATORY_TRACT | 6 refills | Status: DC | PRN

## 2018-11-07 NOTE — Telephone Encounter (Signed)
Nothing further needed at this time. 

## 2018-11-07 NOTE — Telephone Encounter (Signed)
There is a telephone note dated 10/03/18 showing the patient advised our office he was not able to afford the monthly payment for the nebulizer that was ordered on 05/23/18.  Called the patient and he stated he ordered a nebulizer machine from [employer] for $58 because he was quoted an out of pocket cost of $168 by [facility] after his insurance paid their portion.  Patient stated he needed a refill of the albuterol solution so he can use it with the machine.   Patient last office visit was with Dr. Annamaria Boots 03/07/18. Notation was in that note for the patient to use the albuterol neb solution 1 ampule every 6 hours as needed.  Prescription sent to pharmacy.

## 2018-11-08 ENCOUNTER — Encounter: Payer: Self-pay | Admitting: Internal Medicine

## 2018-11-10 ENCOUNTER — Encounter: Payer: Self-pay | Admitting: Internal Medicine

## 2018-11-10 ENCOUNTER — Ambulatory Visit (INDEPENDENT_AMBULATORY_CARE_PROVIDER_SITE_OTHER): Payer: BC Managed Care – PPO | Admitting: Internal Medicine

## 2018-11-10 ENCOUNTER — Other Ambulatory Visit: Payer: Self-pay

## 2018-11-10 DIAGNOSIS — G4733 Obstructive sleep apnea (adult) (pediatric): Secondary | ICD-10-CM

## 2018-11-10 DIAGNOSIS — J4540 Moderate persistent asthma, uncomplicated: Secondary | ICD-10-CM | POA: Diagnosis not present

## 2018-11-10 MED ORDER — ALBUTEROL SULFATE HFA 108 (90 BASE) MCG/ACT IN AERS: INHALATION_SPRAY | RESPIRATORY_TRACT | 12 refills | Status: DC

## 2018-11-10 NOTE — Progress Notes (Signed)
Subjective:    Patient ID: Carlos Day, male    DOB: [birth year], 63 y.o.   MRN: [id number]  HPI male never smoker followed for OSA, asthma, complicated by HBP NPSG 3/79/02  AHI 99.3/ hr, desaturation to 72%,    Body weight 260 lbs Office Spirometry 11/01/16-moderately severe obstructive airways disease with restriction of exhaled volume. FVC 2.96/60%, FEV1 2.14/57%, ratio 0.7 to, FEF 25-75% 1.50/49% -----------------------------------------------------------------------  03/07/2018- 63 year old male never smoker followed for OSA, asthma, complicated by HBP CPAP 40/[id number] -----OSA: DME [place]. Pt wears CPAP nightly for about 3 hours. Pressure works well and no new supplies needed at this time. DL attached.  Download 10% 4-hour compliance AHI 0.5/hour.  He wears CPAP every night and says he just does not sleep longer at one time.  He is not putting it on for naps-which I emphasized. He says the mask comes off in his sleep. Advair too expensive.  We discussed alternatives and can get him a nebulizer machine which should be cheaper.  11/10/2018- 63 year old male never smoker followed for OSA, asthma, complicated by HBP CPAP 24/ [facility] 47% compliance, AHI 4.7/ hr. Wears every night, but often less than 4 hours. Got his nebulizer machine cheaper on-line- albuterol neb solution.This is cheaper for him than Advair was.  Says CPAP just "comes off" in sleep. Full face mask leaks. -----OSA on CPAP 11; DME: [facility], pt states he uses machine q4-5h nightly, states he's going to have to revert back to the nasal mask Body weight today 306 lbs  ROS-see HPI + = positive Constitutional:   No-   weight loss, night sweats, fevers, chills, fatigue, lassitude. HEENT:   No-  headaches, difficulty swallowing, tooth/dental problems, sore throat,       No-  sneezing, itching, ear ache, nasal congestion, post nasal drip,  CV:  No-   chest pain, orthopnea, PND, swelling in  lower extremities, anasarca, dizziness, palpitations Resp: +   shortness of breath with exertion or at rest.              No-   productive cough,  No non-productive cough,  No- coughing up of blood.              No-   change in color of mucus.  +little wheezing.   Skin: No-   rash or lesions. GI:  No-   heartburn, indigestion, abdominal pain, nausea, vomiting,  GU: . MS:  No-   joint pain or swelling. . Neuro-     nothing unusual Psych:  No- change in mood or affect. No depression or anxiety.  No memory loss.  OBJ- Physical Exam General- Alert, Oriented, Affect-appropriate, Distress- none acute, + overweight/ muscular Skin- rash-none, lesions- none, excoriation- none Lymphadenopathy- none Head- atraumatic            Eyes- Gross vision intact, PERRLA, conjunctivae and secretions clear            Ears- Hearing, canals-normal            Nose- Clear, no-Septal dev, mucus, polyps, erosion, perforation             Throat- Mallampati III , mucosa clear , drainage- none, tonsils- atrophic Neck- flexible , trachea midline, no stridor , thyroid nl, carotid no bruit Chest - symmetrical excursion , unlabored           Heart/CV- RRR , no murmur , no gallop  , no rub, nl s1 s2                           -  JVD- none , edema- none, stasis changes- none, varices- none           Lung- + unlabored wheeze- none, cough- none , dullness-none, rub- none           Chest wall-  Abd-  Br/ Gen/ Rectal- Not done, not indicated Extrem- cyanosis- none, clubbing, none, atrophy- none, strength- nl Neuro- grossly intact to observation

## 2018-11-10 NOTE — Patient Instructions (Signed)
Refill script sent for albuterol rescue inhaler  Ok to use up and be done with Advair  See if using a nebulizer machine for breathing treatment, up to 4 times daily if needed, works well to keep your asthma under control without Advair.   Try to keep your CPAP on at least 4 hours per day if you can. Ok to changes masks to find a style you like.  Please call if we can help

## 2018-11-25 ENCOUNTER — Telehealth: Payer: Self-pay | Admitting: Family Medicine

## 2018-11-25 NOTE — Telephone Encounter (Signed)
Please schedule Diabetes follow up with fasting labs prior with Dr. Bedsole.  ?

## 2018-11-30 NOTE — Telephone Encounter (Signed)
Spoke with pt he will call back to schedule.

## 2018-11-30 NOTE — Telephone Encounter (Signed)
Left message asking pt call office

## 2018-12-29 ENCOUNTER — Other Ambulatory Visit: Payer: Self-pay | Admitting: Family Medicine

## 2018-12-29 NOTE — Telephone Encounter (Signed)
Last office visit 05/27/2018 for obesity. Last refilled 11/25/2018 for #30 with no refills with note stating needs office visit.   No future appointments.  Was contacted in July to schedule office visit and patient told Shirlean Mylar he would call back to schedule.  Refill?

## 2018-12-30 MED ORDER — LISINOPRIL-HYDROCHLOROTHIAZIDE 20-25 MG PO TABS: 1.0000 | ORAL_TABLET | Freq: Every day | ORAL | 0 refills | Status: DC

## 2018-12-30 NOTE — Addendum Note (Signed)
Addended by: Carter Kitten on: 12/30/2018 03:40 PM   Modules accepted: Orders

## 2018-12-30 NOTE — Telephone Encounter (Signed)
Pt called asking why his lisinopril HCTZ was denied. I explained to him it was because he has not kept his appts. I scheduled him for a BP f/u on 01-20-19. Pt is asking if he can have enough medicine called in until that appt.

## 2018-12-30 NOTE — Telephone Encounter (Signed)
Refill sent to [facility].

## 2019-01-02 NOTE — Assessment & Plan Note (Signed)
He feels well-controlled using nebulizer which is cheaper than inhalers.

## 2019-01-02 NOTE — Assessment & Plan Note (Signed)
He benefits from CPAP when used. Discussed compliance goals and comfort. Need to enlist family support to help him keep mask on. Continue CPAP 11

## 2019-01-20 ENCOUNTER — Ambulatory Visit: Payer: BC Managed Care – PPO | Admitting: Family Medicine

## 2019-01-20 ENCOUNTER — Encounter: Payer: Self-pay | Admitting: Family Medicine

## 2019-01-20 ENCOUNTER — Other Ambulatory Visit: Payer: Self-pay

## 2019-01-20 VITALS — BP 158/98 | HR 58 | Temp 98.8°F | Ht 70.25 in | Wt 308.6 lb

## 2019-01-20 DIAGNOSIS — E119 Type 2 diabetes mellitus without complications: Secondary | ICD-10-CM

## 2019-01-20 DIAGNOSIS — I10 Essential (primary) hypertension: Secondary | ICD-10-CM

## 2019-01-20 DIAGNOSIS — Z23 Encounter for immunization: Secondary | ICD-10-CM | POA: Diagnosis not present

## 2019-01-20 LAB — POCT GLYCOSYLATED HEMOGLOBIN (HGB A1C): Hemoglobin A1C: 6.2 % — AB (ref 4.0–5.6)

## 2019-01-20 LAB — HM DIABETES FOOT EXAM

## 2019-01-20 MED ORDER — DULAGLUTIDE 0.75 MG/0.5ML ~~LOC~~ SOAJ: 0.7500 mg | SUBCUTANEOUS | 3 refills | Status: DC

## 2019-01-20 NOTE — Progress Notes (Signed)
Chief Complaint  Patient presents with  . Hypertension  . Diabetes    History of Present Illness: HPI    63 year old male presents for follow up HTN, DM  Hypertension:  Inadequate control in office today.  On lisinopril HCTZ  BP Readings from Last 3 Encounters:  01/20/19 (!) 158/98  11/10/18 (!) 142/72  05/27/18 120/78  Using medication without problems or lightheadedness:  none Chest pain with exertion:none Edema:none Short of breath: some asthma flare around cut grass... followed by Dr. Annamaria Boots Average home BPs: not checking lately. Other issues: He has gained 20 lbs in last 8 months. Wt Readings from Last 3 Encounters:  01/20/19 (!) 308 lb 9 oz (140 kg)  11/10/18 (!) 306 lb (138.8 kg)  05/27/18 275 lb 8 oz (125 kg)   Body mass index is 43.96 kg/m.  Walking 2 times a day 25-30 min... increase to this 1 wek ago.  Diet: improved.. some increase in fried foods.  Diabetes:  No longer on trulicity.. no SE, no cost issues. Lab Results  Component Value Date   HGBA1C 6.2 (A) 01/20/2019   Using medications without difficulties: Hypoglycemic episodes: Hyperglycemic episodes: Feet problems: Blood Sugars averaging: eye exam within last year: due    COVID 19 screen No recent travel or known exposure to [place] The patient denies respiratory symptoms of COVID 19 at this time.  The importance of social distancing was discussed today.   Review of Systems  Constitutional: Negative for chills and fever.  HENT: Negative for congestion and ear pain.   Eyes: Negative for pain and redness.  Respiratory: Negative for cough and shortness of breath.   Cardiovascular: Negative for chest pain, palpitations and leg swelling.  Gastrointestinal: Negative for abdominal pain, blood in stool, constipation, diarrhea, nausea and vomiting.  Genitourinary: Negative for dysuria.  Musculoskeletal: Negative for falls and myalgias.  Skin: Negative for rash.  Neurological: Negative for  dizziness.  Psychiatric/Behavioral: Negative for depression. The patient is not nervous/anxious.       Past Medical History:  Diagnosis Date  . Asthma   . Sleep apnea   . Unspecified essential hypertension     reports that he has never smoked. He has never used smokeless tobacco. He reports that he does not drink alcohol or use drugs.   Current Outpatient Medications:  .  albuterol (PROVENTIL) (2.5 MG/3ML) 0.083% nebulizer solution, Take 3 mLs (2.5 mg total) by nebulization every 6 (six) hours as needed for wheezing or shortness of breath., Disp: 75 mL, Rfl: 6 .  albuterol (VENTOLIN HFA) 108 (90 Base) MCG/ACT inhaler, INHALE 2 PUFFS INTO THE LUNGS EVERY 4 (FOUR) HOURS AS NEEDED FOR WHEEZING OR SHORTNESS OF BREATH., Disp: 18 g, Rfl: 12 .  aspirin 81 MG tablet, Take 81 mg by mouth daily.  , Disp: , Rfl:  .  Fluticasone-Salmeterol (ADVAIR DISKUS) 100-50 MCG/DOSE AEPB, Inhale 1 puff into the lungs 2 (two) times daily. Rinse mouth, Disp: 1 each, Rfl: 3 .  lisinopril-hydrochlorothiazide (ZESTORETIC) 20-25 MG tablet, Take 1 tablet by mouth daily., Disp: 30 tablet, Rfl: 0 .  Multiple Vitamins-Minerals (MULTIVITAMIN WITH MINERALS) tablet, Take 1 tablet by mouth daily., Disp: , Rfl:  .  Nebulizers (COMPRESSOR/NEBULIZER) MISC, Use as directed, Disp: 1 each, Rfl: 0 .  Insulin Pen Needle (PEN NEEDLES) 30G X 8 MM MISC, Inject 0.75 mg into the skin once a week. For trulicity weekly (Patient not taking: Reported on 01/20/2019), Disp: 100 each, Rfl: 11 .  TRULICITY 1.5 0000000 SOPN,  Inject 1.5 mg as directed., Disp: , Rfl:    Observations/Objective: Blood pressure (!) 158/98, pulse (!) 58, temperature 98.8 F (37.1 C), temperature source Oral, height 5' 10.25" (1.784 m), weight (!) 308 lb 9 oz (140 kg), SpO2 98 %.  Physical Exam Vitals signs and nursing note reviewed.  Constitutional:      Appearance: He is well-developed. He is obese.  HENT:     Head: Normocephalic.     Right Ear: Hearing normal.      Left Ear: Hearing normal.     Nose: Nose normal.  Neck:     Thyroid: No thyroid mass or thyromegaly.     Vascular: No carotid bruit.     Trachea: Trachea normal.  Cardiovascular:     Rate and Rhythm: Normal rate and regular rhythm.     Pulses: Normal pulses.     Heart sounds: Heart sounds not distant. No murmur. No friction rub. No gallop.      Comments: No peripheral edema Pulmonary:     Effort: Pulmonary effort is normal. No respiratory distress.     Breath sounds: Normal breath sounds.  Skin:    General: Skin is warm and dry.     Findings: No rash.  Psychiatric:        Speech: Speech normal.        Behavior: Behavior normal.        Thought Content: Thought content normal.      Diabetic foot exam: Normal inspection No skin breakdown No calluses  Normal DP pulses Normal sensation to light touch and monofilament Nails normal  Assessment and Plan    Essential hypertension, benign Above goal in office.. follow at home.. pot will call with measurements in 1-2 weeks. IF above goal 140/90 will increase lisinopril component of BP meds. May be worse lately given weight gain.Marland Kitchen lose 10/% current weight recommended.  Diabetes mellitus type II, controlled, with no complications ([place]) Excellent control with diet and lifestyle despite weight gain.  Morbid obesity ([place]) Encouraged exercise, weight loss, healthy eating habits. Start trulicity back  Low dose for weight loss.. can increase dose if tolerating.    Carlos Lofts, MD

## 2019-01-20 NOTE — Patient Instructions (Addendum)
Set up yearly eye exam.  Continue working on healthy eating and regular exercise.  Work on weight loss. Restart trulicity ( dulaglutide) low dose... if tolerated and interested in increasing dose... call or send MyChart message.

## 2019-01-20 NOTE — Assessment & Plan Note (Signed)
Excellent control with diet and lifestyle despite weight gain.

## 2019-01-20 NOTE — Assessment & Plan Note (Addendum)
Above goal in office.. follow at home.. pot will call with measurements in 1-2 weeks. IF above goal 140/90 will increase lisinopril component of BP meds. May be worse lately given weight gain.Marland Kitchen lose 10/% current weight recommended.

## 2019-01-20 NOTE — Assessment & Plan Note (Signed)
Encouraged exercise, weight loss, healthy eating habits. Start trulicity back  Low dose for weight loss.. can increase dose if tolerating.

## 2019-01-28 ENCOUNTER — Other Ambulatory Visit: Payer: Self-pay | Admitting: Family Medicine

## 2019-03-02 ENCOUNTER — Telehealth: Payer: Self-pay | Admitting: Internal Medicine

## 2019-03-02 DIAGNOSIS — G4733 Obstructive sleep apnea (adult) (pediatric): Secondary | ICD-10-CM

## 2019-03-02 NOTE — Telephone Encounter (Signed)
Spoke with pt. States that his currently CPAP machine is broke. Pt would like an order to be sent to [facility] in [place] for a new one. Order has been placed. Nothing further was needed.

## 2019-03-02 NOTE — Telephone Encounter (Signed)
[facility] closed. Will leave in triage and call in the AM.

## 2019-03-03 NOTE — Telephone Encounter (Signed)
Call made to [facility], confirmed DOB, made aware they have the order.   Call made to patient, made aware [facility] has the order but we cannot guarantee he can get it today. I made him aware just being realistic it probably will be next week. [facility] number given. Voiced understanding.   Nothing further needed at this time.

## 2019-05-02 ENCOUNTER — Ambulatory Visit (INDEPENDENT_AMBULATORY_CARE_PROVIDER_SITE_OTHER): Payer: BC Managed Care – PPO | Admitting: Family Medicine

## 2019-05-02 ENCOUNTER — Other Ambulatory Visit: Payer: Self-pay

## 2019-05-02 ENCOUNTER — Encounter: Payer: Self-pay | Admitting: Family Medicine

## 2019-05-02 VITALS — BP 140/92 | HR 58 | Temp 97.7°F | Ht 70.25 in | Wt 310.5 lb

## 2019-05-02 DIAGNOSIS — M7989 Other specified soft tissue disorders: Secondary | ICD-10-CM

## 2019-05-02 DIAGNOSIS — L299 Pruritus, unspecified: Secondary | ICD-10-CM

## 2019-05-02 DIAGNOSIS — E119 Type 2 diabetes mellitus without complications: Secondary | ICD-10-CM | POA: Diagnosis not present

## 2019-05-02 DIAGNOSIS — Z125 Encounter for screening for malignant neoplasm of prostate: Secondary | ICD-10-CM

## 2019-05-02 DIAGNOSIS — I10 Essential (primary) hypertension: Secondary | ICD-10-CM

## 2019-05-02 MED ORDER — TRIAMCINOLONE ACETONIDE 0.5 % EX CREA: 1.0000 "application " | TOPICAL_CREAM | Freq: Two times a day (BID) | CUTANEOUS | 0 refills | Status: DC

## 2019-05-02 NOTE — Patient Instructions (Addendum)
Set up appt on way out.  We will call to set up ultrasound of left leg.  If shortness of breath or chest .. go to ER. S tart claritin and topical triamcinolone cream.

## 2019-05-02 NOTE — Assessment & Plan Note (Signed)
No S/S infection.  eval with doppler to r/out DVT. Eval with labs.

## 2019-05-02 NOTE — Progress Notes (Signed)
Chief Complaint  Patient presents with  . Itching Left Leg    History of Present Illness: HPI   64 year old morbid obese  male  With diabetes, high chol and HTN presents with new onset itching of left leg ongoing x 3 weeks. Left leg is swollen. Several darker pigmented skin areas, dry patched  On foot in several areas. Now having some ithcing on abdomen no.  No med change.  May have had change in detergent.  Some but less swelling in right leg.  Has been using cortisone cream daily in last week.. helps ithcing some.   No pain in calf.   No change in  SOB,  No CP .  No fever.   Father with prosate cancer.    BP Readings from Last 3 Encounters:  05/02/19 (!) 140/92  01/20/19 (!) 158/98  11/10/18 (!) 142/72    Was on penicillin for tooth issue 12/10.Marland Kitchen took x 7 days.  This visit occurred during the SARS-CoV-2 public health emergency.  Safety protocols were in place, including screening questions prior to the visit, additional usage of staff PPE, and extensive cleaning of exam room while observing appropriate contact time as indicated for disinfecting solutions.   COVID 19 screen:  No recent travel or known exposure to COVID19 The patient denies respiratory symptoms of COVID 19 at this time. The importance of social distancing was discussed today.     Review of Systems  Constitutional: Negative for chills and fever.  HENT: Negative for congestion and ear pain.   Eyes: Negative for pain and redness.  Respiratory: Negative for cough and shortness of breath.   Cardiovascular: Negative for chest pain, palpitations and leg swelling.  Gastrointestinal: Negative for abdominal pain, blood in stool, constipation, diarrhea, nausea and vomiting.  Genitourinary: Negative for dysuria.  Musculoskeletal: Negative for falls and myalgias.  Skin: Positive for itching and rash.  Neurological: Negative for dizziness.  Psychiatric/Behavioral: Negative for depression. The patient is not  nervous/anxious.       Past Medical History:  Diagnosis Date  . Asthma   . Sleep apnea   . Unspecified essential hypertension     reports that he has never smoked. He has never used smokeless tobacco. He reports that he does not drink alcohol or use drugs.   Current Outpatient Medications:  .  albuterol (PROVENTIL) (2.5 MG/3ML) 0.083% nebulizer solution, Take 3 mLs (2.5 mg total) by nebulization every 6 (six) hours as needed for wheezing or shortness of breath., Disp: 75 mL, Rfl: 6 .  albuterol (VENTOLIN HFA) 108 (90 Base) MCG/ACT inhaler, INHALE 2 PUFFS INTO THE LUNGS EVERY 4 (FOUR) HOURS AS NEEDED FOR WHEEZING OR SHORTNESS OF BREATH., Disp: 18 g, Rfl: 12 .  APPLE CIDER VINEGAR PO, Take 1 tablet by mouth 2 (two) times daily., Disp: , Rfl:  .  aspirin 81 MG tablet, Take 81 mg by mouth daily.  , Disp: , Rfl:  .  CINNAMON PO, Take 1 tablet by mouth 2 (two) times daily., Disp: , Rfl:  .  Dulaglutide 0.75 MG/0.5ML SOPN, Inject 0.75 mg into the skin once a week., Disp: 4 mL, Rfl: 3 .  Fluticasone-Salmeterol (ADVAIR DISKUS) 100-50 MCG/DOSE AEPB, Inhale 1 puff into the lungs 2 (two) times daily. Rinse mouth, Disp: 1 each, Rfl: 3 .  Insulin Pen Needle (PEN NEEDLES) 30G X 8 MM MISC, Inject 0.75 mg into the skin once a week. For trulicity weekly, Disp: 100 each, Rfl: 11 .  lisinopril-hydrochlorothiazide (ZESTORETIC) 20-25 MG  tablet, Take 1 tablet by mouth daily., Disp: 30 tablet, Rfl: 5 .  Multiple Vitamins-Minerals (MULTIVITAMIN WITH MINERALS) tablet, Take 1 tablet by mouth daily., Disp: , Rfl:  .  Nebulizers (COMPRESSOR/NEBULIZER) MISC, Use as directed, Disp: 1 each, Rfl: 0   Observations/Objective: Blood pressure (!) 140/92, pulse (!) 58, temperature 97.7 F (36.5 C), temperature source Temporal, height 5' 10.25" (1.784 m), weight (!) 310 lb 8 oz (140.8 kg), SpO2 98 %.  Physical Exam Constitutional:      Appearance: He is well-developed. He is obese.  HENT:     Head: Normocephalic.      Right Ear: Hearing normal.     Left Ear: Hearing normal.     Nose: Nose normal.  Neck:     Thyroid: No thyroid mass or thyromegaly.     Vascular: No carotid bruit.     Trachea: Trachea normal.  Cardiovascular:     Rate and Rhythm: Normal rate and regular rhythm.     Pulses: Normal pulses.     Heart sounds: Heart sounds not distant. No murmur. No friction rub. No gallop.      Comments: No peripheral edema Pulmonary:     Effort: Pulmonary effort is normal. No respiratory distress.     Breath sounds: Normal breath sounds.  Chest:     Comments: Left leg swelling.. 2 plus pitting,  Taut skin, hyperpigmented patches on left foot   No pain in calf.   no knee pain, no hip pain. Skin:    General: Skin is warm and dry.     Findings: No rash.  Psychiatric:        Speech: Speech normal.        Behavior: Behavior normal.        Thought Content: Thought content normal.      Assessment and Plan Essential hypertension, benign Mild elevation despite medication.. may need to increase medication at upcoming [place]. Encouraged exercise, weight loss, healthy eating habits.   Left leg swelling No S/S infection.  eval with doppler to r/out DVT. Eval with labs.  Pruritus ? If reactive to allergen. Start claritin and topical triamcinolone cream.  Eval with labs.       Eliezer Lofts, MD

## 2019-05-02 NOTE — Assessment & Plan Note (Signed)
Mild elevation despite medication.. may need to increase medication at upcoming [place]. Encouraged exercise, weight loss, healthy eating habits.

## 2019-05-02 NOTE — Assessment & Plan Note (Signed)
?   If reactive to allergen. Start claritin and topical triamcinolone cream.  Eval with labs.

## 2019-05-03 ENCOUNTER — Ambulatory Visit (HOSPITAL_COMMUNITY): Admission: RE | Admit: 2019-05-03 | Payer: BC Managed Care – PPO | Source: Ambulatory Visit

## 2019-05-03 ENCOUNTER — Other Ambulatory Visit (INDEPENDENT_AMBULATORY_CARE_PROVIDER_SITE_OTHER): Payer: BC Managed Care – PPO

## 2019-05-03 DIAGNOSIS — E119 Type 2 diabetes mellitus without complications: Secondary | ICD-10-CM | POA: Diagnosis not present

## 2019-05-03 DIAGNOSIS — L299 Pruritus, unspecified: Secondary | ICD-10-CM

## 2019-05-03 DIAGNOSIS — Z125 Encounter for screening for malignant neoplasm of prostate: Secondary | ICD-10-CM | POA: Diagnosis not present

## 2019-05-03 LAB — LIPID PANEL
Cholesterol: 169 mg/dL (ref 0–200)
HDL: 32.1 mg/dL — ABNORMAL LOW (ref >39.00)
LDL Cholesterol: 107 mg/dL — ABNORMAL HIGH (ref 0–99)
NonHDL: 137.23
Total CHOL/HDL Ratio: 5
Triglycerides: 153 mg/dL — ABNORMAL HIGH (ref 0.0–149.0)
VLDL: 30.6 mg/dL (ref 0.0–40.0)

## 2019-05-03 LAB — COMPREHENSIVE METABOLIC PANEL
ALT: 41 U/L (ref 0–53)
AST: 27 U/L (ref 0–37)
Albumin: 4.2 g/dL (ref 3.5–5.2)
Alkaline Phosphatase: 52 U/L (ref 39–117)
BUN: 10 mg/dL (ref 6–23)
CO2: 32 mEq/L (ref 19–32)
Calcium: 9.7 mg/dL (ref 8.4–10.5)
Chloride: 101 mEq/L (ref 96–112)
Creatinine, Ser: 0.91 mg/dL (ref 0.40–1.50)
GFR: 84.2 mL/min (ref >60.00)
Glucose, Bld: 109 mg/dL — ABNORMAL HIGH (ref 70–99)
Potassium: 4.3 mEq/L (ref 3.5–5.1)
Sodium: 138 mEq/L (ref 135–145)
Total Bilirubin: 0.5 mg/dL (ref 0.2–1.2)
Total Protein: 6.7 g/dL (ref 6.0–8.3)

## 2019-05-03 LAB — PSA: PSA: 0.47 ng/mL (ref 0.10–4.00)

## 2019-05-03 LAB — CBC WITH DIFFERENTIAL/PLATELET
Basophils Absolute: 0 10*3/uL (ref 0.0–0.1)
Basophils Relative: 0.4 % (ref 0.0–3.0)
Eosinophils Absolute: 0.6 10*3/uL (ref 0.0–0.7)
Eosinophils Relative: 6.4 % — ABNORMAL HIGH (ref 0.0–5.0)
HCT: 43.9 % (ref 39.0–52.0)
Hemoglobin: 14.5 g/dL (ref 13.0–17.0)
Lymphocytes Relative: 36.5 % (ref 12.0–46.0)
Lymphs Abs: 3.3 10*3/uL (ref 0.7–4.0)
MCHC: 33.1 g/dL (ref 30.0–36.0)
MCV: 84.7 fl (ref 78.0–100.0)
Monocytes Absolute: 0.9 10*3/uL (ref 0.1–1.0)
Monocytes Relative: 10.1 % (ref 3.0–12.0)
Neutro Abs: 4.2 10*3/uL (ref 1.4–7.7)
Neutrophils Relative %: 46.6 % (ref 43.0–77.0)
Platelets: 221 10*3/uL (ref 150.0–400.0)
RBC: 5.19 Mil/uL (ref 4.22–5.81)
RDW: 15.5 % (ref 11.5–15.5)
WBC: 9.1 10*3/uL (ref 4.0–10.5)

## 2019-05-03 LAB — TSH: TSH: 1.96 u[IU]/mL (ref 0.35–4.50)

## 2019-05-03 LAB — HEMOGLOBIN A1C: Hgb A1c MFr Bld: 6.4 % (ref 4.6–6.5)

## 2019-05-04 NOTE — Progress Notes (Signed)
No critical labs need to be addressed urgently. We will discuss labs in detail at upcoming office visit.   

## 2019-05-08 ENCOUNTER — Ambulatory Visit (HOSPITAL_COMMUNITY)
Admission: RE | Admit: 2019-05-08 | Discharge: 2019-05-08 | Disposition: A | Discharge status: Home / Self Care | Payer: BC Managed Care – PPO | Source: Ambulatory Visit | Attending: Cardiology | Admitting: Cardiology

## 2019-05-08 ENCOUNTER — Other Ambulatory Visit (HOSPITAL_COMMUNITY): Payer: Self-pay | Admitting: Family Medicine

## 2019-05-08 ENCOUNTER — Other Ambulatory Visit: Payer: Self-pay

## 2019-05-08 DIAGNOSIS — M79662 Pain in left lower leg: Secondary | ICD-10-CM | POA: Diagnosis present

## 2019-05-08 DIAGNOSIS — M7989 Other specified soft tissue disorders: Secondary | ICD-10-CM | POA: Insufficient documentation

## 2019-05-09 ENCOUNTER — Other Ambulatory Visit: Payer: Self-pay | Admitting: *Deleted

## 2019-05-09 MED ORDER — TRIAMCINOLONE ACETONIDE 0.5 % EX CREA: 1.0000 "application " | TOPICAL_CREAM | Freq: Two times a day (BID) | CUTANEOUS | 0 refills | Status: DC

## 2019-05-16 ENCOUNTER — Ambulatory Visit (INDEPENDENT_AMBULATORY_CARE_PROVIDER_SITE_OTHER): Payer: BC Managed Care – PPO | Admitting: Family Medicine

## 2019-05-16 ENCOUNTER — Other Ambulatory Visit: Payer: Self-pay

## 2019-05-16 ENCOUNTER — Encounter: Payer: Self-pay | Admitting: Family Medicine

## 2019-05-16 VITALS — BP 130/90 | HR 59 | Temp 98.3°F | Ht 70.0 in | Wt 303.2 lb

## 2019-05-16 DIAGNOSIS — E119 Type 2 diabetes mellitus without complications: Secondary | ICD-10-CM

## 2019-05-16 DIAGNOSIS — L299 Pruritus, unspecified: Secondary | ICD-10-CM

## 2019-05-16 DIAGNOSIS — E7800 Pure hypercholesterolemia, unspecified: Secondary | ICD-10-CM | POA: Diagnosis not present

## 2019-05-16 DIAGNOSIS — M7989 Other specified soft tissue disorders: Secondary | ICD-10-CM

## 2019-05-16 DIAGNOSIS — I10 Essential (primary) hypertension: Secondary | ICD-10-CM

## 2019-05-16 DIAGNOSIS — G4733 Obstructive sleep apnea (adult) (pediatric): Secondary | ICD-10-CM

## 2019-05-16 MED ORDER — ATORVASTATIN CALCIUM 10 MG PO TABS: 10.0000 mg | ORAL_TABLET | Freq: Every day | ORAL | 1 refills | Status: DC

## 2019-05-16 NOTE — Assessment & Plan Note (Signed)
Encouraged exercise, weight loss, healthy eating habits. Restart trulicity. Follow up in 3 months.

## 2019-05-16 NOTE — Assessment & Plan Note (Signed)
IMproved with elevation and weight loss. Neg lab and [place] eval.

## 2019-05-16 NOTE — Assessment & Plan Note (Addendum)
Borderline control. Follow BP at home.. may need to increase lisinopril part of losartan HCTZ.

## 2019-05-16 NOTE — Assessment & Plan Note (Signed)
Start atorvastatin.  Re-eval lipids in 3 months.

## 2019-05-16 NOTE — Progress Notes (Signed)
Chief Complaint  Patient presents with  . Annual Exam    History of Present Illness: HPI    64 year old male presents for  Annual exam and 2 week follow up of left leg swelling  itching and rash.  He reports rash is improving and less swelling in left leg. No DVT seen on dopplers. Lab eval showed no anemia, nml wbcs, nm;l thyroid, nml GFR and LFts. Started Claritin and topical triamcinolone.  HTN    Improved control on blood pressure but not at goal.  No CP, no SOB.n slightly high 148  BP at home has beee BP Readings from Last 3 Encounters:  05/16/19 130/90  05/02/19 (!) 140/92  01/20/19 (!) 158/98   Diabetes:   Good control, no longer on dulaglutide  Given cost Lab Results  Component Value Date   HGBA1C 6.4 05/03/2019  Using medications without difficulties: Hypoglycemic episodes: Hyperglycemic episodes: Feet problems: no ulcers Blood Sugars averaging: eye exam within last year: due   Wt Readings from Last 3 Encounters:  05/16/19 (!) 303 lb 4 oz (137.6 kg)  05/02/19 (!) 310 lb 8 oz (140.8 kg)  01/20/19 (!) 308 lb 9 oz (140 kg)    Elevated Cholesterol:  LDL almost at goal < 100  NOT ON STATIN. Lab Results  Component Value Date   CHOL 169 05/03/2019   HDL 32.10 (L) 05/03/2019   LDLCALC 107 (H) 05/03/2019   LDLDIRECT 125.0 11/17/2016   TRIG 153.0 (H) 05/03/2019   CHOLHDL 5 05/03/2019  Using medications without problems: Muscle aches:  Diet compliance: poor Exercise: Other complaints:  OSA, asthma: followed by pulmonary.  This visit occurred during the SARS-CoV-2 public health emergency.  Safety protocols were in place, including screening questions prior to the visit, additional usage of staff PPE, and extensive cleaning of exam room while observing appropriate contact time as indicated for disinfecting solutions.   COVID 19 screen:  No recent travel or known exposure to COVID19 The patient denies respiratory symptoms of COVID 19 at this time. The  importance of social distancing was discussed today.     Review of Systems  Constitutional: Negative for chills and fever.  HENT: Negative for congestion and ear pain.   Eyes: Negative for pain and redness.  Respiratory: Negative for cough and shortness of breath.   Cardiovascular: Negative for chest pain, palpitations and leg swelling.  Gastrointestinal: Negative for abdominal pain, blood in stool, constipation, diarrhea, nausea and vomiting.  Genitourinary: Negative for dysuria.  Musculoskeletal: Negative for falls and myalgias.  Skin: Negative for rash.  Neurological: Negative for dizziness.  Psychiatric/Behavioral: Negative for depression. The patient is not nervous/anxious.       Past Medical History:  Diagnosis Date  . Asthma   . Sleep apnea   . Unspecified essential hypertension     reports that he has never smoked. He has never used smokeless tobacco. He reports that he does not drink alcohol or use drugs.   Current Outpatient Medications:  .  albuterol (PROVENTIL) (2.5 MG/3ML) 0.083% nebulizer solution, Take 3 mLs (2.5 mg total) by nebulization every 6 (six) hours as needed for wheezing or shortness of breath., Disp: 75 mL, Rfl: 6 .  albuterol (VENTOLIN HFA) 108 (90 Base) MCG/ACT inhaler, INHALE 2 PUFFS INTO THE LUNGS EVERY 4 (FOUR) HOURS AS NEEDED FOR WHEEZING OR SHORTNESS OF BREATH., Disp: 18 g, Rfl: 12 .  APPLE CIDER VINEGAR PO, Take 1 tablet by mouth 2 (two) times daily., Disp: , Rfl:  .  aspirin 81 MG tablet, Take 81 mg by mouth daily.  , Disp: , Rfl:  .  CINNAMON PO, Take 1 tablet by mouth 2 (two) times daily., Disp: , Rfl:  .  Dulaglutide 0.75 MG/0.5ML SOPN, Inject 0.75 mg into the skin once a week., Disp: 4 mL, Rfl: 3 .  Fluticasone-Salmeterol (ADVAIR DISKUS) 100-50 MCG/DOSE AEPB, Inhale 1 puff into the lungs 2 (two) times daily. Rinse mouth, Disp: 1 each, Rfl: 3 .  Insulin Pen Needle (PEN NEEDLES) 30G X 8 MM MISC, Inject 0.75 mg into the skin once a week. For  trulicity weekly, Disp: 100 each, Rfl: 11 .  lisinopril-hydrochlorothiazide (ZESTORETIC) 20-25 MG tablet, Take 1 tablet by mouth daily., Disp: 30 tablet, Rfl: 5 .  Multiple Vitamins-Minerals (MULTIVITAMIN WITH MINERALS) tablet, Take 1 tablet by mouth daily., Disp: , Rfl:  .  Nebulizers (COMPRESSOR/NEBULIZER) MISC, Use as directed, Disp: 1 each, Rfl: 0 .  triamcinolone cream (KENALOG) 0.5 %, Apply 1 application topically 2 (two) times daily., Disp: 30 g, Rfl: 0   Observations/Objective: Blood pressure 130/90, pulse (!) 59, temperature 98.3 F (36.8 C), temperature source Temporal, height 5\' 10"  (1.778 m), weight (!) 303 lb 4 oz (137.6 kg), SpO2 96 %.  Physical Exam Constitutional:      General: He is not in acute distress.    Appearance: Normal appearance. He is well-developed. He is obese. He is not ill-appearing or toxic-appearing.  HENT:     Head: Normocephalic and atraumatic.     Right Ear: Hearing, tympanic membrane, ear canal and external ear normal.     Left Ear: Hearing, tympanic membrane, ear canal and external ear normal.     Nose: Nose normal.     Mouth/Throat:     Pharynx: Uvula midline.  Eyes:     General: Lids are normal. Lids are everted, no foreign bodies appreciated.     Conjunctiva/sclera: Conjunctivae normal.     Pupils: Pupils are equal, round, and reactive to light.  Neck:     Thyroid: No thyroid mass or thyromegaly.     Vascular: No carotid bruit.     Trachea: Trachea and phonation normal.  Cardiovascular:     Rate and Rhythm: Normal rate and regular rhythm.     Pulses: Normal pulses.     Heart sounds: S1 normal and S2 normal. No murmur. No gallop.   Pulmonary:     Breath sounds: Normal breath sounds. No wheezing, rhonchi or rales.  Abdominal:     General: Bowel sounds are normal.     Palpations: Abdomen is soft.     Tenderness: There is no abdominal tenderness. There is no guarding or rebound.     Hernia: No hernia is present.  Musculoskeletal:      Cervical back: Normal range of motion and neck supple.  Lymphadenopathy:     Cervical: No cervical adenopathy.  Skin:    General: Skin is warm and dry.     Findings: No rash.  Neurological:     Mental Status: He is alert.     Cranial Nerves: No cranial nerve deficit.     Sensory: No sensory deficit.     Gait: Gait normal.     Deep Tendon Reflexes: Reflexes are normal and symmetric.  Psychiatric:        Speech: Speech normal.        Behavior: Behavior normal.        Judgment: Judgment normal.      Assessment and Plan  The patient's preventative maintenance and recommended screening tests for an annual wellness exam were reviewed in full today. Brought up to date unless services declined.  Counselled on the importance of diet, exercise, and its role in overall health and mortality. The patient's FH and SH was reviewed, including their home life, tobacco status, and drug and alcohol status.   Due for eye exam. Uptodate with  Flu, td and PNA vaccines. Lab Results  Component Value Date   PSA 0.47 05/03/2019   PSA 0.56 04/18/2018   PSA 0.52 11/17/2016  Colon Ca [place]:  no family colon cancer. Last colon in 2014, sessile polyp Dr. Eugenia Pancoast, repeat was due 2024.  Hep C: done  HIV : done 2017  nonsmoker  no ETOH   Essential hypertension, benign Borderline control. Follow BP at home.. may need to increase lisinopril part of losartan HCTZ.  Obstructive sleep apnea  Followed by Pulm  Diabetes mellitus type II, controlled, with no complications ([facility])  Good control.. restart trulicity for weight loss.  Morbid obesity ([place]) Encouraged exercise, weight loss, healthy eating habits. Restart trulicity. Follow up in 3 months.  High cholesterol Start atorvastatin.  Re-eval lipids in 3 months.  Left leg swelling IMproved with elevation and weight loss. Neg lab and [place] eval.  Pruritus Resolved.. ? Contact dermatitis.    Eliezer Lofts, MD

## 2019-05-16 NOTE — Assessment & Plan Note (Signed)
Followed by Pulm. 

## 2019-05-16 NOTE — Patient Instructions (Addendum)
Set up yearly eye exam. Restart Trulicity weekly at [id number] mg. Work on regular exercise and low carb diet. Start atorvastatin  daily for cholesterol. Follow BP at home... goal < 140/90... call if not running there  Can usee Cetaphil cream or Eucerin cream.

## 2019-05-16 NOTE — Assessment & Plan Note (Signed)
Resolved.. ? Contact dermatitis.

## 2019-05-16 NOTE — Assessment & Plan Note (Signed)
Good control.. restart trulicity for weight loss.

## 2019-05-17 ENCOUNTER — Other Ambulatory Visit: Payer: Self-pay | Admitting: Family Medicine

## 2019-05-18 NOTE — Telephone Encounter (Signed)
Last office visit 05/16/2019 for left leg swelling.  Last refilled 05/09/2019 for 30 g with no refills.  Next Appt: 08/18/2019 for 3 month follow up.

## 2019-07-28 ENCOUNTER — Other Ambulatory Visit: Payer: Self-pay | Admitting: Family Medicine

## 2019-08-14 ENCOUNTER — Other Ambulatory Visit (INDEPENDENT_AMBULATORY_CARE_PROVIDER_SITE_OTHER): Payer: BC Managed Care – PPO

## 2019-08-14 ENCOUNTER — Other Ambulatory Visit: Payer: Self-pay

## 2019-08-14 ENCOUNTER — Telehealth: Payer: Self-pay | Admitting: Family Medicine

## 2019-08-14 DIAGNOSIS — E119 Type 2 diabetes mellitus without complications: Secondary | ICD-10-CM | POA: Diagnosis not present

## 2019-08-14 LAB — COMPREHENSIVE METABOLIC PANEL
ALT: 31 U/L (ref 0–53)
AST: 20 U/L (ref 0–37)
Albumin: 4.2 g/dL (ref 3.5–5.2)
Alkaline Phosphatase: 54 U/L (ref 39–117)
BUN: 10 mg/dL (ref 6–23)
CO2: 30 mEq/L (ref 19–32)
Calcium: 9.4 mg/dL (ref 8.4–10.5)
Chloride: 102 mEq/L (ref 96–112)
Creatinine, Ser: 0.89 mg/dL (ref 0.40–1.50)
GFR: 86.31 mL/min (ref >60.00)
Glucose, Bld: 103 mg/dL — ABNORMAL HIGH (ref 70–99)
Potassium: 4 mEq/L (ref 3.5–5.1)
Sodium: 138 mEq/L (ref 135–145)
Total Bilirubin: 0.5 mg/dL (ref 0.2–1.2)
Total Protein: 6.8 g/dL (ref 6.0–8.3)

## 2019-08-14 LAB — LIPID PANEL
Cholesterol: 111 mg/dL (ref 0–200)
HDL: 33.1 mg/dL — ABNORMAL LOW (ref >39.00)
LDL Cholesterol: 62 mg/dL (ref 0–99)
NonHDL: 77.77
Total CHOL/HDL Ratio: 3
Triglycerides: 80 mg/dL (ref 0.0–149.0)
VLDL: 16 mg/dL (ref 0.0–40.0)

## 2019-08-14 LAB — HEMOGLOBIN A1C: Hgb A1c MFr Bld: 6.1 % (ref 4.6–6.5)

## 2019-08-14 NOTE — Telephone Encounter (Signed)
-----   Message from Ellamae Sia sent at 08/01/2019  2:41 PM EDT ----- Regarding: Lab orders for Monday, 4.19.21 Lab orders for a 3 month follow up appt.

## 2019-08-14 NOTE — Progress Notes (Signed)
No critical labs need to be addressed urgently. We will discuss labs in detail at upcoming office visit.   

## 2019-08-18 ENCOUNTER — Other Ambulatory Visit: Payer: Self-pay

## 2019-08-18 ENCOUNTER — Ambulatory Visit (INDEPENDENT_AMBULATORY_CARE_PROVIDER_SITE_OTHER): Payer: BC Managed Care – PPO | Admitting: Family Medicine

## 2019-08-18 ENCOUNTER — Encounter: Payer: Self-pay | Admitting: Family Medicine

## 2019-08-18 VITALS — BP 132/86 | HR 54 | Temp 97.8°F | Ht 70.0 in | Wt 284.0 lb

## 2019-08-18 DIAGNOSIS — E119 Type 2 diabetes mellitus without complications: Secondary | ICD-10-CM | POA: Diagnosis not present

## 2019-08-18 DIAGNOSIS — I10 Essential (primary) hypertension: Secondary | ICD-10-CM | POA: Diagnosis not present

## 2019-08-18 DIAGNOSIS — E7800 Pure hypercholesterolemia, unspecified: Secondary | ICD-10-CM | POA: Diagnosis not present

## 2019-08-18 NOTE — Assessment & Plan Note (Signed)
Well controlled. Continue current medication.  

## 2019-08-18 NOTE — Patient Instructions (Signed)
Set up yearly eye exam when able to.  Continue current medications.  Keep up healthy eating regular exercise and weight.

## 2019-08-18 NOTE — Assessment & Plan Note (Signed)
Good control on trulicity. 

## 2019-08-18 NOTE — Assessment & Plan Note (Signed)
BMI goal far off but pt heading in correct direction.

## 2019-08-18 NOTE — Assessment & Plan Note (Signed)
Now at goal on statin LDL < 100.

## 2019-08-18 NOTE — Progress Notes (Signed)
Chief Complaint  Patient presents with  . Follow-up    DM/CHOL/Weight Managment    History of Present Illness: HPI  64 year old male presents for follow up DM  Diabetes:   At goal on trulicity Lab Results  Component Value Date   HGBA1C 6.1 08/14/2019  Using medications without difficulties: Hypoglycemic episodes: Hyperglycemic episodes: Feet problems: no ulcers Blood Sugars averaging: not checking eye exam within last year: due   Elevated Cholesterol:  At goal on lipitor. Lab Results  Component Value Date   CHOL 111 08/14/2019   HDL 33.10 (L) 08/14/2019   LDLCALC 62 08/14/2019   LDLDIRECT 125.0 11/17/2016   TRIG 80.0 08/14/2019   CHOLHDL 3 08/14/2019  Using medications without problems: none Muscle aches: none Diet compliance: having shakes for breakfast and lunch, more salad,  Exercise: increase walking to daily for 1 hour Other complaints:   Hypertension:   At goal on lisinopril HCTZ BP Readings from Last 3 Encounters:  08/18/19 132/86  05/16/19 130/90  05/02/19 (!) 140/92  Using medication without problems or lightheadedness: none Chest pain with exertion:none Edema:none Short of breath:none Average home BPs: Other issues:   Wt Readings from Last 3 Encounters:  08/18/19 284 lb (128.8 kg)  05/16/19 (!) 303 lb 4 oz (137.6 kg)  05/02/19 (!) 310 lb 8 oz (140.8 kg)     This visit occurred during the SARS-CoV-2 public health emergency.  Safety protocols were in place, including screening questions prior to the visit, additional usage of staff PPE, and extensive cleaning of exam room while observing appropriate contact time as indicated for disinfecting solutions.   COVID 19 screen:  No recent travel or known exposure to COVID19 The patient denies respiratory symptoms of COVID 19 at this time. The importance of social distancing was discussed today.     Review of Systems  Constitutional: Negative for chills and fever.  HENT: Negative for congestion  and ear pain.   Eyes: Negative for pain and redness.  Respiratory: Negative for cough and shortness of breath.   Cardiovascular: Negative for chest pain, palpitations and leg swelling.  Gastrointestinal: Negative for abdominal pain, blood in stool, constipation, diarrhea, nausea and vomiting.  Genitourinary: Negative for dysuria.  Musculoskeletal: Negative for falls and myalgias.  Skin: Negative for rash.  Neurological: Negative for dizziness.  Psychiatric/Behavioral: Negative for depression. The patient is not nervous/anxious.       Past Medical History:  Diagnosis Date  . Asthma   . Sleep apnea   . Unspecified essential hypertension     reports that he has never smoked. He has never used smokeless tobacco. He reports that he does not drink alcohol or use drugs.   Current Outpatient Medications:  .  albuterol (PROVENTIL) (2.5 MG/3ML) 0.083% nebulizer solution, Take 3 mLs (2.5 mg total) by nebulization every 6 (six) hours as needed for wheezing or shortness of breath., Disp: 75 mL, Rfl: 6 .  albuterol (VENTOLIN HFA) 108 (90 Base) MCG/ACT inhaler, INHALE 2 PUFFS INTO THE LUNGS EVERY 4 (FOUR) HOURS AS NEEDED FOR WHEEZING OR SHORTNESS OF BREATH., Disp: 18 g, Rfl: 12 .  APPLE CIDER VINEGAR PO, Take 1 tablet by mouth 2 (two) times daily., Disp: , Rfl:  .  aspirin 81 MG tablet, Take 81 mg by mouth daily.  , Disp: , Rfl:  .  atorvastatin (LIPITOR) 10 MG tablet, TAKE ONE TABLET BY MOUTH DAILY, Disp: 30 tablet, Rfl: 10 .  CINNAMON PO, Take 1 tablet by mouth 2 (two) times  daily., Disp: , Rfl:  .  Dulaglutide 0.75 MG/0.5ML SOPN, Inject 0.75 mg into the skin once a week., Disp: 4 mL, Rfl: 3 .  Fluticasone-Salmeterol (ADVAIR DISKUS) 100-50 MCG/DOSE AEPB, Inhale 1 puff into the lungs 2 (two) times daily. Rinse mouth, Disp: 1 each, Rfl: 3 .  Insulin Pen Needle (PEN NEEDLES) 30G X 8 MM MISC, Inject 0.75 mg into the skin once a week. For trulicity weekly, Disp: 100 each, Rfl: 11 .   lisinopril-hydrochlorothiazide (ZESTORETIC) 20-25 MG tablet, Take 1 tablet by mouth daily., Disp: 30 tablet, Rfl: 5 .  Multiple Vitamins-Minerals (MULTIVITAMIN WITH MINERALS) tablet, Take 1 tablet by mouth daily., Disp: , Rfl:  .  Nebulizers (COMPRESSOR/NEBULIZER) MISC, Use as directed, Disp: 1 each, Rfl: 0 .  triamcinolone cream (KENALOG) 0.5 %, APPLY TOPICALLY TWO TIMES A DAY, Disp: 30 g, Rfl: 0   Observations/Objective: Blood pressure 132/86, pulse (!) 54, temperature 97.8 F (36.6 C), temperature source Temporal, height 5\' 10"  (1.778 m), weight 284 lb (128.8 kg), SpO2 97 %.  Physical Exam Constitutional:      Appearance: He is well-developed. He is obese.  HENT:     Head: Normocephalic.     Right Ear: Hearing normal.     Left Ear: Hearing normal.     Nose: Nose normal.  Neck:     Thyroid: No thyroid mass or thyromegaly.     Vascular: No carotid bruit.     Trachea: Trachea normal.  Cardiovascular:     Rate and Rhythm: Normal rate and regular rhythm.     Pulses: Normal pulses.     Heart sounds: Heart sounds not distant. No murmur. No friction rub. No gallop.      Comments: No peripheral edema Pulmonary:     Effort: Pulmonary effort is normal. No respiratory distress.     Breath sounds: Normal breath sounds.  Skin:    General: Skin is warm and dry.     Findings: No rash.  Psychiatric:        Speech: Speech normal.        Behavior: Behavior normal.        Thought Content: Thought content normal.      Assessment and Plan    Essential hypertension, benign Well controlled. Continue current medication.   Diabetes mellitus type II, controlled, with no complications ([place])  Good control on trulicity.  High cholesterol  Now at goal on statin LDL < 100.  Morbid obesity ([place]) BMI goal far off but pt heading in correct direction.    Eliezer Lofts, MD

## 2019-08-31 ENCOUNTER — Other Ambulatory Visit: Payer: Self-pay | Admitting: Family Medicine

## 2019-09-01 ENCOUNTER — Other Ambulatory Visit: Payer: Self-pay | Admitting: Internal Medicine

## 2019-09-01 ENCOUNTER — Telehealth: Payer: Self-pay

## 2019-09-01 NOTE — Telephone Encounter (Signed)
Pt left v/m requesting refill on Advair. Per DPR I left v/m on pts cell that the Advair refill request had already been sent to Dr Annamaria Boots the pulmonologist who had prescribed the medication.

## 2019-11-08 ENCOUNTER — Encounter: Payer: Self-pay | Admitting: Internal Medicine

## 2019-11-10 ENCOUNTER — Ambulatory Visit (INDEPENDENT_AMBULATORY_CARE_PROVIDER_SITE_OTHER): Payer: BC Managed Care – PPO | Admitting: Internal Medicine

## 2019-11-10 ENCOUNTER — Encounter: Payer: Self-pay | Admitting: Internal Medicine

## 2019-11-10 ENCOUNTER — Other Ambulatory Visit: Payer: Self-pay

## 2019-11-10 VITALS — BP 146/84 | HR 62 | Temp 98.2°F | Ht 70.0 in | Wt 287.6 lb

## 2019-11-10 DIAGNOSIS — J4540 Moderate persistent asthma, uncomplicated: Secondary | ICD-10-CM

## 2019-11-10 DIAGNOSIS — G4733 Obstructive sleep apnea (adult) (pediatric): Secondary | ICD-10-CM

## 2019-11-10 NOTE — Progress Notes (Signed)
Subjective:    Patient ID: Carlos Day, male    DOB: May 12, 1955, 64 y.o.   MRN: [id number]  HPI male never smoker followed for OSA, asthma, complicated by HBP NPSG 2/35/57  AHI 99.3/ hr, desaturation to 72%,    Body weight 260 lbs Office Spirometry 11/01/16-moderately severe obstructive airways disease with restriction of exhaled volume. FVC 2.96/60%, FEV1 2.14/57%, ratio 0.7 to, FEF 25-75% 1.50/49% -----------------------------------------------------------------------   11/10/2018- 64 year old male never smoker followed for OSA, asthma, complicated by HBP CPAP 32/ [facility] 47% compliance, AHI 4.7/ hr. Wears every night, but often less than 4 hours. Got his nebulizer machine cheaper on-line- albuterol neb solution.This is cheaper for him than Advair was.  Says CPAP just "comes off" in sleep. Full face mask leaks. -----OSA on CPAP 11; DME: [facility], pt states he uses machine q4-5h nightly, states he's going to have to revert back to the nasal mask Body weight today 306 lbs  11/10/19- 64 year old male never smoker followed for OSA, asthma, complicated by HBP CPAP 20/ [facility] compliance 77%, AHI 3.8/ hr- uses every night, but some short nights Body weight today 287 lbs Reports short sleep nights. ASks DME closer to him in [place]. Machine is over 27 yrs old.  Had J&J Covax Asthma controlled with Advair, occ albuterol hfa.  ROS-see HPI + = positive Constitutional:   + weight loss, night sweats, fevers, chills, fatigue, lassitude. HEENT:   No-  headaches, difficulty swallowing, tooth/dental problems, sore throat,       No-  sneezing, itching, ear ache, nasal congestion, post nasal drip,  CV:  No-   chest pain, orthopnea, PND, swelling in lower extremities, anasarca, dizziness, palpitations Resp: +   shortness of breath with exertion or at rest.              No-   productive cough,  No non-productive cough,  No- coughing up of blood.              No-    change in color of mucus.  +little wheezing.   Skin: No-   rash or lesions. GI:  No-   heartburn, indigestion, abdominal pain, nausea, vomiting,  GU: . MS:  No-   joint pain or swelling. . Neuro-     nothing unusual Psych:  No- change in mood or affect. No depression or anxiety.  No memory loss.  OBJ- Physical Exam General- Alert, Oriented, Affect-appropriate, Distress- none acute, + overweight/ muscular Skin- rash-none, lesions- none, excoriation- none Lymphadenopathy- none Head- atraumatic            Eyes- Gross vision intact, PERRLA, conjunctivae and secretions clear            Ears- Hearing, canals-normal            Nose- Clear, no-Septal dev, mucus, polyps, erosion, perforation             Throat- Mallampati III , mucosa clear , drainage- none, tonsils- atrophic Neck- flexible , trachea midline, no stridor , thyroid nl, carotid no bruit Chest - symmetrical excursion , unlabored           Heart/CV- RRR , no murmur , no gallop  , no rub, nl s1 s2                           - JVD- none , edema- none, stasis changes- none, varices- none  Lung- + trace wheeze, cough- none , dullness-none, rub- none           Chest wall-  Abd-  Br/ Gen/ Rectal- Not done, not indicated Extrem- cyanosis- none, clubbing, none, atrophy- none, strength- nl Neuro- grossly intact to observation

## 2019-11-10 NOTE — Patient Instructions (Signed)
Order-  Patient requests DME change from [place] because he lives in [place].                                         Replace old CPAP machine if eligible, change to auto 5-15, mask of choice, humidifier, supplies, airView/ card  We can continue current inhalers. Please let us know when you need refills.

## 2019-11-17 ENCOUNTER — Ambulatory Visit: Payer: BC Managed Care – PPO | Admitting: Family Medicine

## 2019-11-18 NOTE — Assessment & Plan Note (Signed)
No recent exacerbation. Plan- continue current medss

## 2019-11-18 NOTE — Assessment & Plan Note (Signed)
He benefits from CPAP and uses severy night, but some short sleep nights. Plan- Change DME from [place] to one closer to hm in [place]. Replace old machine, changing to auto 5-15.

## 2019-11-24 ENCOUNTER — Telehealth: Payer: Self-pay | Admitting: Internal Medicine

## 2019-11-24 NOTE — Telephone Encounter (Signed)
Due to pt not having copy of original sleep study, Dr. Annamaria Boots, please advise if you are okay with [place] ordering a new sleep study?

## 2019-11-24 NOTE — Telephone Encounter (Signed)
His original sleep study from 2006 is available in Epic under Notes tab, dated 09/10/2010 @ 10:55, and can be copied from there. He has been compliant and may not need to repeat sleep study if this satisfies DME requirement.

## 2019-11-24 NOTE — Telephone Encounter (Signed)
Pt called back, I let him know that we needed the original sleep study-- pt says he does not have it. Pt says he can bring the chip from his cpap or needs to be scheduled for HST.

## 2019-11-24 NOTE — Telephone Encounter (Signed)
Patient contacted that we found his sleep study. Patient understands we will call him if anything further is needed.

## 2019-12-14 ENCOUNTER — Ambulatory Visit: Payer: BC Managed Care – PPO | Admitting: Family Medicine

## 2019-12-21 ENCOUNTER — Other Ambulatory Visit: Payer: Self-pay | Admitting: Internal Medicine

## 2019-12-25 ENCOUNTER — Other Ambulatory Visit: Payer: Self-pay | Admitting: Internal Medicine

## 2019-12-28 ENCOUNTER — Telehealth: Payer: Self-pay | Admitting: Internal Medicine

## 2019-12-28 MED ORDER — ALBUTEROL SULFATE (2.5 MG/3ML) 0.083% IN NEBU: 2.5000 mg | INHALATION_SOLUTION | Freq: Four times a day (QID) | RESPIRATORY_TRACT | 6 refills | Status: DC | PRN

## 2019-12-28 NOTE — Telephone Encounter (Signed)
Called and spoke with Patient.  Patient requested a refill of his albuterol neb to be sent [facility]. Albuterol neb refill sent to requested pharmacy.  Nothing further at this time.

## 2020-02-22 ENCOUNTER — Telehealth: Payer: Self-pay | Admitting: Family Medicine

## 2020-02-22 NOTE — Telephone Encounter (Signed)
Please schedule office visit to follow up on diabetes with Dr. Diona Browner.

## 2020-02-23 ENCOUNTER — Telehealth: Payer: Self-pay | Admitting: Family Medicine

## 2020-02-23 NOTE — Telephone Encounter (Signed)
Left message for Carlos Day that he is past due for an office visit with Dr. Diona Browner.  Last seen 07/2019 and was suppose to follow up in July.  I ask that he call back to schedule office visit with fasting labs prior.

## 2020-02-23 NOTE — Telephone Encounter (Signed)
Pt called in due to he just picked up his last refill for Lisinopril (Zestoretic) 25mg  and the Trulicity

## 2020-02-29 NOTE — Telephone Encounter (Signed)
Left message asking pt to call office  °

## 2020-03-07 ENCOUNTER — Emergency Department (HOSPITAL_COMMUNITY)
Admission: EM | Admit: 2020-03-07 | Discharge: 2020-03-08 | Disposition: A | Discharge status: Home / Self Care | Payer: BC Managed Care – PPO | Attending: Emergency Medicine | Admitting: Emergency Medicine

## 2020-03-07 ENCOUNTER — Telehealth: Payer: Self-pay

## 2020-03-07 ENCOUNTER — Encounter (HOSPITAL_COMMUNITY): Payer: Self-pay

## 2020-03-07 ENCOUNTER — Other Ambulatory Visit: Payer: Self-pay

## 2020-03-07 ENCOUNTER — Emergency Department (HOSPITAL_COMMUNITY): Payer: BC Managed Care – PPO

## 2020-03-07 DIAGNOSIS — Z794 Long term (current) use of insulin: Secondary | ICD-10-CM | POA: Diagnosis not present

## 2020-03-07 DIAGNOSIS — Z20822 Contact with and (suspected) exposure to covid-19: Secondary | ICD-10-CM | POA: Diagnosis not present

## 2020-03-07 DIAGNOSIS — J45901 Unspecified asthma with (acute) exacerbation: Secondary | ICD-10-CM | POA: Diagnosis not present

## 2020-03-07 DIAGNOSIS — E119 Type 2 diabetes mellitus without complications: Secondary | ICD-10-CM | POA: Diagnosis not present

## 2020-03-07 DIAGNOSIS — I10 Essential (primary) hypertension: Secondary | ICD-10-CM | POA: Diagnosis not present

## 2020-03-07 DIAGNOSIS — R519 Headache, unspecified: Secondary | ICD-10-CM

## 2020-03-07 DIAGNOSIS — Z79899 Other long term (current) drug therapy: Secondary | ICD-10-CM | POA: Insufficient documentation

## 2020-03-07 MED ORDER — METOCLOPRAMIDE HCL 5 MG/ML IJ SOLN
10.0000 mg | Freq: Once | INTRAMUSCULAR | Status: AC
  Administered 2020-03-07: 10 mg via INTRAVENOUS
  Filled 2020-03-07: qty 2

## 2020-03-07 MED ORDER — ACETAMINOPHEN 325 MG PO TABS
650.0000 mg | ORAL_TABLET | Freq: Once | ORAL | Status: AC
  Administered 2020-03-07: 650 mg via ORAL
  Filled 2020-03-07: qty 2

## 2020-03-07 MED ORDER — LACTATED RINGERS IV BOLUS
1000.0000 mL | Freq: Once | INTRAVENOUS | Status: AC
  Administered 2020-03-07: 1000 mL via INTRAVENOUS

## 2020-03-07 MED ORDER — DIPHENHYDRAMINE HCL 50 MG/ML IJ SOLN
25.0000 mg | Freq: Once | INTRAMUSCULAR | Status: AC
  Administered 2020-03-07: 25 mg via INTRAVENOUS
  Filled 2020-03-07: qty 1

## 2020-03-07 NOTE — Telephone Encounter (Signed)
How long has he been taking the lisinopril HCTZ  TWO tabs daily.. we have him down as one daily on med list? Are BPs elevated still even after 1 week of TWO tabs daily?   yes needs lab prior

## 2020-03-07 NOTE — ED Provider Notes (Signed)
[facility] EMERGENCY DEPARTMENT Provider Note   CSN: [id number] Arrival date & time: 03/07/20  2009     History Chief Complaint  Patient presents with  . Hypertension    Carlos Day is a 64 y.o. male.  The history is provided by the patient.  Headache Pain location:  Generalized Quality: throbbing. Radiates to:  Does not radiate Severity currently:  3/10 Severity at highest:  6/10 Onset quality:  Gradual Duration:  1 week Timing:  Intermittent Progression:  Waxing and waning Chronicity:  New Similar to prior headaches: no   Relieved by:  Nothing Worsened by:  Nothing Associated symptoms: fatigue   Associated symptoms: no abdominal pain, no back pain, no cough, no ear pain, no eye pain, no fever, no numbness, no seizures, no sore throat, no vomiting and no weakness        Past Medical History:  Diagnosis Date  . Asthma   . Sleep apnea   . Unspecified essential hypertension     Patient Active Problem List   Diagnosis Date Noted  . Left leg swelling 05/02/2019  . Constipation 05/27/2018  . High cholesterol 11/18/2016  . Morbid obesity ([place]) 11/17/2016  . Bilateral knee pain 05/27/2015  . Asthma with acute exacerbation 08/28/2014  . Diabetes mellitus type II, controlled, with no complications ([place]) 45/36/4680  . Metabolic syndrome 32/03/2481  . ROTATOR CUFF SYNDROME, LEFT 10/09/2009  . 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 RHINITIS, ALLERGIC NOS 02/09/2007  . Essential hypertension, benign 01/24/2007  . Obstructive sleep apnea 01/17/2007  . Asthma, moderate persistent 01/17/2007    Past Surgical History:  Procedure Laterality Date  . COLONOSCOPY         Family History  Problem Relation Age of Onset  . Breast cancer Mother   . Hypertension Brother   . Prostate cancer Father   . Colon cancer Neg Hx   . Esophageal cancer Neg Hx   . Rectal cancer Neg Hx   . Stomach cancer Neg Hx     Social History   Tobacco Use  . Smoking  status: Never Smoker  . Smokeless tobacco: Never Used  Substance Use Topics  . Alcohol use: No    Alcohol/week: 0.0 standard drinks  . Drug use: No    Home Medications Prior to Admission medications   Medication Sig Start Date End Date Taking? Authorizing Provider  albuterol (PROVENTIL) (2.5 MG/3ML) 0.083% nebulizer solution Take 3 mLs (2.5 mg total) by nebulization every 6 (six) hours as needed for wheezing or shortness of breath. 12/28/19   Baird Lyons D, MD  albuterol (VENTOLIN HFA) 108 (90 Base) MCG/ACT inhaler INHALE TWO PUFFS BY MOUTH EVERY 4 HOURS AS NEEDED FOR WHEEZING OR SHORTNESS OF BREATH 12/21/19   Young, Tarri Fuller D, MD  APPLE CIDER VINEGAR PO Take 1 tablet by mouth 2 (two) times daily.    [provider]  aspirin 81 MG tablet Take 81 mg by mouth daily.      [provider]  atorvastatin (LIPITOR) 10 MG tablet TAKE ONE TABLET BY MOUTH DAILY 07/28/19   Bedsole, Amy E, MD  CINNAMON PO Take 1 tablet by mouth 2 (two) times daily.    [provider]  Fluticasone-Salmeterol (ADVAIR) 100-50 MCG/DOSE AEPB INHALE ONE PUFF INTO THE LUNGS TWICE A DAY. RINSE MOUTH AFTER USE. 09/01/19   Baird Lyons D, MD  Insulin Pen Needle (PEN NEEDLES) 30G X 8 MM MISC Inject 0.75 mg into the skin once a week. For trulicity weekly 50/03/70  Bedsole, Amy E, MD  lisinopril-hydrochlorothiazide (ZESTORETIC) 20-25 MG tablet TAKE ONE TABLET BY MOUTH DAILY 08/31/19   Bedsole, Amy E, MD  Multiple Vitamins-Minerals (MULTIVITAMIN WITH MINERALS) tablet Take 1 tablet by mouth daily.    [provider]  Nebulizers (COMPRESSOR/NEBULIZER) MISC Use as directed 03/07/18   Baird Lyons D, MD  triamcinolone cream (KENALOG) 0.5 % APPLY TOPICALLY TWO TIMES A DAY 05/18/19   Bedsole, Amy E, MD  TRULICITY 9.79 GX/2.1JH SOPN INJECT 0.75 MG UNDER THE SKIN ONCE WEEKLY 02/22/20   Jinny Sanders, MD    Allergies    Patient has no known allergies.  Review of Systems   Review of Systems    Constitutional: Positive for fatigue. Negative for chills and fever.  HENT: Negative for ear pain and sore throat.   Eyes: Negative for pain and visual disturbance.  Respiratory: Negative for cough and shortness of breath.   Cardiovascular: Negative for chest pain and palpitations.  Gastrointestinal: Negative for abdominal pain and vomiting.  Genitourinary: Negative for dysuria and hematuria.  Musculoskeletal: Negative for arthralgias and back pain.  Skin: Negative for color change and rash.  Neurological: Positive for headaches. Negative for seizures, syncope, weakness and numbness.  All other systems reviewed and are negative.   Physical Exam Updated Vital Signs BP (!) 158/94   Pulse (!) 54   Temp 98.1 F (36.7 C) (Oral)   Resp 20   SpO2 100%   Physical Exam Vitals and nursing note reviewed.  Constitutional:      Appearance: He is well-developed. He is not ill-appearing, toxic-appearing or diaphoretic.  HENT:     Head: Normocephalic and atraumatic.     Mouth/Throat:     Mouth: Mucous membranes are moist.     Pharynx: Oropharynx is clear.  Eyes:     Conjunctiva/sclera: Conjunctivae normal.     Pupils: Pupils are equal, round, and reactive to light.  Cardiovascular:     Rate and Rhythm: Normal rate and regular rhythm.     Heart sounds: No murmur heard.   Pulmonary:     Effort: Pulmonary effort is normal. No respiratory distress.     Breath sounds: Normal breath sounds.  Abdominal:     Palpations: Abdomen is soft.     Tenderness: There is no abdominal tenderness.  Musculoskeletal:     Cervical back: Neck supple.  Skin:    General: Skin is warm and dry.  Neurological:     Mental Status: He is alert.     Comments: Mental status: alert and oriented to person, place, time, situation. Speech: Speech is clear and language is not aphasic Fund of knowledge: Intact  Cranial Nerves:  II: Intact to confrontation bilaterally III, IV, VI: EOMI, no nystagmus V: face  sensation intact, good masseter strength VII: no facial droop or weakness VIII: gross hearing intact bilaterally IX/XI: palate elevates symmetrically XII: tongue protrudes symmetrically, no deviation  Strength: 5/5 and symmetric in BUE and BLE. No pronation or drift. Tone: normal tone, no tremors Coordination: Intact finger to nose and heel to shin. Sensation: intact to light touch in all extremities.  Romberg negative.  Gait: Routine gait stable without assistance      ED Results / Procedures / Treatments   Labs (all labs ordered are listed, but only abnormal results are displayed) Labs Reviewed  RESPIRATORY PANEL BY RT PCR (FLU A&B, COVID)    EKG None  Radiology CT Head Wo Contrast  Result Date: 03/07/2020 CLINICAL DATA:  Headache elevated blood  pressure EXAM: CT HEAD WITHOUT CONTRAST TECHNIQUE: Contiguous axial images were obtained from the base of the skull through the vertex without intravenous contrast. COMPARISON:  CT brain 04/17/2005 FINDINGS: Brain: No acute territorial infarction, hemorrhage or intracranial mass. The ventricles are nonenlarged. Vascular: No hyperdense vessel or unexpected calcification. Skull: Normal. Negative for fracture or focal lesion. Sinuses/Orbits: Mild mucosal thickening in the paranasal sinuses Other: None IMPRESSION: Negative non contrasted CT appearance of the brain. Electronically Signed   By: Donavan Foil M.D.   On: 03/07/2020 22:30    Procedures Procedures (including critical care time)  Medications Ordered in ED Medications  lactated ringers bolus 1,000 mL (0 mLs Intravenous Stopped 03/08/20 0017)  metoCLOPramide (REGLAN) injection 10 mg (10 mg Intravenous Given 03/07/20 2257)  diphenhydrAMINE (BENADRYL) injection 25 mg (25 mg Intravenous Given 03/07/20 2256)  acetaminophen (TYLENOL) tablet 650 mg (650 mg Oral Given 03/07/20 2305)    ED Course  I have reviewed the triage vital signs and the nursing notes.  Pertinent labs &  imaging results that were available during my care of the patient were reviewed by me and considered in my medical decision making (see chart for details).    MDM Rules/Calculators/A&P                          The patient is a 64yo male, PMH HTN who presents to the ED for headache and concern for HTN.  On my initial evaluation, the patient is hemodynamically stable, afebrile, nontoxic-appearing. Physical exam remarkable for normal neuro exam.  Differentials considered include CVA, ICH, SAH, migraine, viral illness, hypertensive emergency. Due to well appearance, normal neuro exam, and headache with some benign features, low suspicion for ICH or CVA. CT head obtained, unremarkable. EKG with sinus bradycardia, no ischemic changes. PO tylenol, IV fluid bolus and IV benadryl and reglan provided with improvement in headache. Low suspicion for hypertensive emergency and will defer further treatment of HTN to PCP. COVID test ordered and pending but low suspicion for COVID with no cough, SOB, fevers.   Advised patient of concern for benign headache and HTN without urgency or emergency. Advised treatment of symptoms with continuing home medications, tylenol every 6 hrs as needed for headache. Recommended follow-up with PCP in the next couple days. Strict return precautions provided. Patient discharged in stable condition.   The care of this patient was overseen by Dr. Ralene Bathe, who agreed with evaluation and plan of care.   Final Clinical Impression(s) / ED Diagnoses Final diagnoses:  Acute nonintractable headache, unspecified headache type  Hypertension, unspecified type    Rx / DC Orders ED Discharge Orders    None       Launa Flight, MD 03/08/20 0150    Quintella Reichert, MD 03/08/20 [phone]

## 2020-03-07 NOTE — Telephone Encounter (Signed)
Pt called triage line stating he has not been feeling well the last few days. Checked his BP this morning and it was 162/97 before his medications. I had him recheck it and it was 156/98.  Started Sunday with a headache and dizziness when he gets up. Not coughing. No fever.   He does not check his blood sugar at home.   He takes lisinopril-hctz 20-25. He said he took 2 this morning. Asked if he could get a new medications for his BP to take prior to appt 03-15-20.  Also, does he needs labs prior to appt 03-15-20?  [employer]

## 2020-03-07 NOTE — ED Notes (Signed)
Pt to CT via stretcher

## 2020-03-07 NOTE — ED Triage Notes (Signed)
Pt arrives to ED w/ c/o HTN states BP at home was 157/91. Pt endorses headache. Currently taking lisonopril.

## 2020-03-07 NOTE — Telephone Encounter (Signed)
Spoke with patient regarding blood pressure and medication.  He only took 2 tablets this time not on a regular basis.  He has a follow up next week.

## 2020-03-08 LAB — RESPIRATORY PANEL BY RT PCR (FLU A&B, COVID)
Influenza A by PCR: NEGATIVE
Influenza B by PCR: NEGATIVE
SARS Coronavirus 2 by RT PCR: NEGATIVE

## 2020-03-12 ENCOUNTER — Telehealth: Payer: Self-pay | Admitting: Family Medicine

## 2020-03-12 ENCOUNTER — Other Ambulatory Visit (INDEPENDENT_AMBULATORY_CARE_PROVIDER_SITE_OTHER): Payer: BC Managed Care – PPO

## 2020-03-12 ENCOUNTER — Other Ambulatory Visit: Payer: Self-pay

## 2020-03-12 DIAGNOSIS — E119 Type 2 diabetes mellitus without complications: Secondary | ICD-10-CM | POA: Diagnosis not present

## 2020-03-12 DIAGNOSIS — Z125 Encounter for screening for malignant neoplasm of prostate: Secondary | ICD-10-CM

## 2020-03-12 LAB — COMPREHENSIVE METABOLIC PANEL
ALT: 26 U/L (ref 0–53)
AST: 19 U/L (ref 0–37)
Albumin: 4.3 g/dL (ref 3.5–5.2)
Alkaline Phosphatase: 54 U/L (ref 39–117)
BUN: 16 mg/dL (ref 6–23)
CO2: 35 mEq/L — ABNORMAL HIGH (ref 19–32)
Calcium: 9.7 mg/dL (ref 8.4–10.5)
Chloride: 97 mEq/L (ref 96–112)
Creatinine, Ser: 0.95 mg/dL (ref 0.40–1.50)
GFR: 85.1 mL/min (ref >60.00)
Glucose, Bld: 109 mg/dL — ABNORMAL HIGH (ref 70–99)
Potassium: 4.2 mEq/L (ref 3.5–5.1)
Sodium: 137 mEq/L (ref 135–145)
Total Bilirubin: 0.6 mg/dL (ref 0.2–1.2)
Total Protein: 6.9 g/dL (ref 6.0–8.3)

## 2020-03-12 LAB — LIPID PANEL
Cholesterol: 118 mg/dL (ref 0–200)
HDL: 32.4 mg/dL — ABNORMAL LOW (ref >39.00)
LDL Cholesterol: 67 mg/dL (ref 0–99)
NonHDL: 85.23
Total CHOL/HDL Ratio: 4
Triglycerides: 92 mg/dL (ref 0.0–149.0)
VLDL: 18.4 mg/dL (ref 0.0–40.0)

## 2020-03-12 LAB — HEMOGLOBIN A1C: Hgb A1c MFr Bld: 6.5 % (ref 4.6–6.5)

## 2020-03-12 LAB — PSA: PSA: 0.48 ng/mL (ref 0.10–4.00)

## 2020-03-12 NOTE — Telephone Encounter (Signed)
Labs 11/16 Follow up 11/19

## 2020-03-12 NOTE — Telephone Encounter (Signed)
-----   Message from Cloyd Stagers, RT sent at 03/08/2020  2:31 PM EST ----- Regarding: Lab Orders for Tuesday 03/12/2020 Please place lab orders for Tuesday 03/12/2020, office visit for diabetes f/u on Friday 11.19.2021 Thank you, Dyke Maes RT(R)

## 2020-03-15 ENCOUNTER — Ambulatory Visit (INDEPENDENT_AMBULATORY_CARE_PROVIDER_SITE_OTHER): Payer: BC Managed Care – PPO | Admitting: Family Medicine

## 2020-03-15 ENCOUNTER — Other Ambulatory Visit: Payer: Self-pay

## 2020-03-15 ENCOUNTER — Encounter: Payer: Self-pay | Admitting: Family Medicine

## 2020-03-15 VITALS — BP 126/82 | HR 61 | Temp 97.7°F | Ht 70.0 in | Wt 284.2 lb

## 2020-03-15 DIAGNOSIS — I152 Hypertension secondary to endocrine disorders: Secondary | ICD-10-CM

## 2020-03-15 DIAGNOSIS — E785 Hyperlipidemia, unspecified: Secondary | ICD-10-CM

## 2020-03-15 DIAGNOSIS — E1169 Type 2 diabetes mellitus with other specified complication: Secondary | ICD-10-CM | POA: Diagnosis not present

## 2020-03-15 DIAGNOSIS — Z23 Encounter for immunization: Secondary | ICD-10-CM

## 2020-03-15 DIAGNOSIS — J301 Allergic rhinitis due to pollen: Secondary | ICD-10-CM

## 2020-03-15 DIAGNOSIS — E1159 Type 2 diabetes mellitus with other circulatory complications: Secondary | ICD-10-CM | POA: Diagnosis not present

## 2020-03-15 MED ORDER — LISINOPRIL-HYDROCHLOROTHIAZIDE 20-12.5 MG PO TABS: 2.0000 | ORAL_TABLET | Freq: Every day | ORAL | 11 refills | Status: DC

## 2020-03-15 MED ORDER — FLUTICASONE-SALMETEROL 100-50 MCG/DOSE IN AEPB
INHALATION_SPRAY | RESPIRATORY_TRACT | 3 refills | Status: DC
Start: 2020-03-15 — End: 2020-09-17

## 2020-03-15 MED ORDER — TRULICITY 1.5 MG/0.5ML ~~LOC~~ SOAJ: 1.5000 mg | SUBCUTANEOUS | 11 refills | Status: DC

## 2020-03-15 NOTE — Assessment & Plan Note (Signed)
Good control but trending up. Increase trulicity, mainly for weight management.

## 2020-03-15 NOTE — Assessment & Plan Note (Signed)
At goal on statin 

## 2020-03-15 NOTE — Assessment & Plan Note (Addendum)
Improved control on double dose of lisinopril HCTZ.  Increase dose on rx, follow BPs at home.   Associated with diabetes.

## 2020-03-15 NOTE — Patient Instructions (Addendum)
Stay on higher dose of lisinopril HCTZ.. sent in prescription to take 2 a day.  Follow BP at home.. goal< 140/90 for next 1-2 weeks.  Send measurements in 2 week to MyChart.  Increase Trulicity dose to 1.5 mg weekly.  Start Claritin, start Flonase 2 sprays per nostril daily.

## 2020-03-15 NOTE — Assessment & Plan Note (Signed)
Start claritin and flonase.  If not improving consider COVID testing

## 2020-03-15 NOTE — Progress Notes (Signed)
Chief Complaint  Patient presents with  . Diabetes  . Hypertension    seen in ER on 03/07/20    History of Present Illness: HPI  64 year old male presents for follow up on DM and HTN.   He was also seen in ER on 03/07/2020 for elevated BP and headache.  Note reviewed in detail.  EKG sinus brady  CT ead: neg  Labs unremarkable Headache, lightheadedness  Improved.  Diabetes:  On trulicity Lab Results  Component Value Date   HGBA1C 6.5 03/12/2020  Using medications without difficulties: Hypoglycemic episodes: Hyperglycemic episodes: Feet problems: Blood Sugars averaging: eye exam within last year: due Wt Readings from Last 3 Encounters:  03/15/20 284 lb 4 oz (128.9 kg)  11/10/19 287 lb 9.6 oz (130.5 kg)  08/18/19 284 lb (128.8 kg)    Hypertension:   Good control in office today on lisinopril HCTZ  (now on 2 tabs daily in last 2 days) BP Readings from Last 3 Encounters:  03/15/20 126/82  03/08/20 (!) 158/94  11/10/19 (!) 146/84  Using medication without problems or lightheadedness: none Chest pain with exertion: Edema: Short of breath: Average home BPs: Other issues:   High cholesterol on  Atorvastatin LDL at goal < 100 Lab Results  Component Value Date   CHOL 118 03/12/2020   HDL 32.40 (L) 03/12/2020   LDLCALC 67 03/12/2020   LDLDIRECT 125.0 11/17/2016   TRIG 92.0 03/12/2020   CHOLHDL 4 03/12/2020   Has had some increase in sneeze, and nasal congestions  In last 2 days.   Has fall allergies.  No asthma exacerbation.   This visit occurred during the SARS-CoV-2 public health emergency.  Safety protocols were in place, including screening questions prior to the visit, additional usage of staff PPE, and extensive cleaning of exam room while observing appropriate contact time as indicated for disinfecting solutions.   COVID 19 screen:  No recent travel or known exposure to COVID19 The patient denies respiratory symptoms of COVID 19 at this time. The  importance of social distancing was discussed today.     Review of Systems  Constitutional: Negative for chills and fever.  HENT: Negative for congestion and ear pain.   Eyes: Negative for pain and redness.  Respiratory: Negative for cough and shortness of breath.   Cardiovascular: Negative for chest pain, palpitations and leg swelling.  Gastrointestinal: Negative for abdominal pain, blood in stool, constipation, diarrhea, nausea and vomiting.  Genitourinary: Negative for dysuria.  Musculoskeletal: Negative for falls and myalgias.  Skin: Negative for rash.  Neurological: Negative for dizziness.  Psychiatric/Behavioral: Negative for depression. The patient is not nervous/anxious.       Past Medical History:  Diagnosis Date  . Asthma   . Sleep apnea   . Unspecified essential hypertension     reports that he has never smoked. He has never used smokeless tobacco. He reports that he does not drink alcohol and does not use drugs.   Current Outpatient Medications:  .  albuterol (PROVENTIL) (2.5 MG/3ML) 0.083% nebulizer solution, Take 3 mLs (2.5 mg total) by nebulization every 6 (six) hours as needed for wheezing or shortness of breath., Disp: 75 mL, Rfl: 6 .  albuterol (VENTOLIN HFA) 108 (90 Base) MCG/ACT inhaler, INHALE TWO PUFFS BY MOUTH EVERY 4 HOURS AS NEEDED FOR WHEEZING OR SHORTNESS OF BREATH, Disp: 18 g, Rfl: 5 .  APPLE CIDER VINEGAR PO, Take 1 tablet by mouth 2 (two) times daily., Disp: , Rfl:  .  aspirin 81  MG tablet, Take 81 mg by mouth daily.  , Disp: , Rfl:  .  atorvastatin (LIPITOR) 10 MG tablet, TAKE ONE TABLET BY MOUTH DAILY, Disp: 30 tablet, Rfl: 10 .  CINNAMON PO, Take 1 tablet by mouth 2 (two) times daily., Disp: , Rfl:  .  Fluticasone-Salmeterol (ADVAIR) 100-50 MCG/DOSE AEPB, INHALE ONE PUFF INTO THE LUNGS TWICE A DAY. RINSE MOUTH AFTER USE., Disp: 60 each, Rfl: 3 .  Insulin Pen Needle (PEN NEEDLES) 30G X 8 MM MISC, Inject 0.75 mg into the skin once a week. For trulicity  weekly, Disp: 100 each, Rfl: 11 .  lisinopril-hydrochlorothiazide (ZESTORETIC) 20-25 MG tablet, TAKE ONE TABLET BY MOUTH DAILY, Disp: 30 tablet, Rfl: 5 .  Multiple Vitamins-Minerals (MULTIVITAMIN WITH MINERALS) tablet, Take 1 tablet by mouth daily., Disp: , Rfl:  .  Nebulizers (COMPRESSOR/NEBULIZER) MISC, Use as directed, Disp: 1 each, Rfl: 0 .  triamcinolone cream (KENALOG) 0.5 %, APPLY TOPICALLY TWO TIMES A DAY, Disp: 30 g, Rfl: 0 .  TRULICITY 7.26 OM/3.5DH SOPN, INJECT 0.75 MG UNDER THE SKIN ONCE WEEKLY, Disp: 2 mL, Rfl: 0   Observations/Objective: Blood pressure 126/82, pulse 61, temperature 97.7 F (36.5 C), temperature source Temporal, height 5\' 10"  (1.778 m), weight 284 lb 4 oz (128.9 kg), SpO2 95 %.  Physical Exam Constitutional:      Appearance: He is well-developed.  HENT:     Head: Normocephalic.     Right Ear: Hearing normal.     Left Ear: Hearing normal.     Nose: Nose normal.  Neck:     Thyroid: No thyroid mass or thyromegaly.     Vascular: No carotid bruit.     Trachea: Trachea normal.  Cardiovascular:     Rate and Rhythm: Normal rate and regular rhythm.     Pulses: Normal pulses.     Heart sounds: Heart sounds not distant. No murmur heard.  No friction rub. No gallop.      Comments: No peripheral edema Pulmonary:     Effort: Pulmonary effort is normal. No respiratory distress.     Breath sounds: Normal breath sounds.  Skin:    General: Skin is warm and dry.     Findings: No rash.  Psychiatric:        Speech: Speech normal.        Behavior: Behavior normal.        Thought Content: Thought content normal.      . Assessment and Plan Hypertension associated with diabetes ([place]) Improved control on double dose of lisinopril HCTZ.  Increase dose on rx, follow BPs at home.   Associated with diabetes.  Allergic rhinitis Start claritin and flonase.  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 Increase trulicity, mainly for weight management.  Morbid obesity ([place]) Increase Trulicity. Encouraged exercise, weight loss, healthy eating habits.   Hyperlipidemia associated with type 2 diabetes mellitus ([place])  At goal on statin.       Eliezer Lofts, MD

## 2020-03-15 NOTE — Assessment & Plan Note (Signed)
Increase Trulicity. Encouraged exercise, weight loss, healthy eating habits.

## 2020-03-27 ENCOUNTER — Other Ambulatory Visit: Payer: Self-pay

## 2020-03-27 ENCOUNTER — Ambulatory Visit
Admission: EM | Admit: 2020-03-27 | Discharge: 2020-03-27 | Disposition: A | Discharge status: Home / Self Care | Payer: BC Managed Care – PPO | Attending: Emergency Medicine | Admitting: Emergency Medicine

## 2020-03-27 ENCOUNTER — Telehealth: Payer: Self-pay | Admitting: Family Medicine

## 2020-03-27 ENCOUNTER — Ambulatory Visit (INDEPENDENT_AMBULATORY_CARE_PROVIDER_SITE_OTHER): Payer: BC Managed Care – PPO

## 2020-03-27 DIAGNOSIS — R059 Cough, unspecified: Secondary | ICD-10-CM

## 2020-03-27 DIAGNOSIS — R0602 Shortness of breath: Secondary | ICD-10-CM

## 2020-03-27 DIAGNOSIS — J189 Pneumonia, unspecified organism: Secondary | ICD-10-CM | POA: Diagnosis not present

## 2020-03-27 MED ORDER — PREDNISONE 50 MG PO TABS: 50.0000 mg | ORAL_TABLET | Freq: Every day | ORAL | 0 refills | Status: AC

## 2020-03-27 MED ORDER — IPRATROPIUM-ALBUTEROL 0.5-2.5 (3) MG/3ML IN SOLN: 3.0000 mL | Freq: Four times a day (QID) | RESPIRATORY_TRACT | 0 refills | Status: DC | PRN

## 2020-03-27 MED ORDER — BENZONATATE 200 MG PO CAPS: 200.0000 mg | ORAL_CAPSULE | Freq: Three times a day (TID) | ORAL | 0 refills | Status: AC | PRN

## 2020-03-27 MED ORDER — ALBUTEROL SULFATE HFA 108 (90 BASE) MCG/ACT IN AERS
1.0000 | INHALATION_SPRAY | Freq: Four times a day (QID) | RESPIRATORY_TRACT | 0 refills | Status: DC | PRN
Start: 2020-03-27 — End: 2021-04-10

## 2020-03-27 MED ORDER — AZITHROMYCIN 250 MG PO TABS: 250.0000 mg | ORAL_TABLET | Freq: Every day | ORAL | 0 refills | Status: DC

## 2020-03-27 MED ORDER — DEXAMETHASONE 10 MG/ML FOR PEDIATRIC ORAL USE
16.0000 mg | Freq: Once | INTRAMUSCULAR | Status: AC
  Administered 2020-03-27: 16 mg via ORAL

## 2020-03-27 NOTE — Telephone Encounter (Addendum)
Patient called stating that he was in recently to see Dr. Diona Browner for a physical. Patient stated that he has had a real bad cough for about a week that is productive/green. Patient denies fever, but complains of SOB, difficulty breathing and some wheezing. Patient stated that he had a cough when he came into the office but it has gotten worse. Patient was advised with his symptoms he should have a face to face evaluation so that someone can listen to his lungs. Patient agreed to go to [facility] to be evaluated. Patient was given ER precautions and he verbalized understanding.

## 2020-03-27 NOTE — ED Triage Notes (Signed)
Patient states he has had cough, congestion, and shortness of breath x 1 week. Pt is aox4 and ambulatory but is having some difficulty speaking in full sentences.

## 2020-03-27 NOTE — Telephone Encounter (Signed)
Please call and triage.  

## 2020-03-27 NOTE — Discharge Instructions (Signed)
X-ray does show multifocal pneumonia, this is often viral and we have seen this a lot with Covid I gave you a dose of Decadron today, may continue with prednisone for the next 5 days to help with shortness of breath/wheezing and inflammation in chest Albuterol inhaler as needed for shortness of breath and wheezing Begin azithromycin-2 tablets today, 1 tablet for the following 4 days Rest and fluids Follow-up if not improving or worsening

## 2020-03-27 NOTE — Telephone Encounter (Signed)
Patient called in and stated he is requesting cough medicine asap to [facility].  Please advise if possible.

## 2020-03-27 NOTE — ED Provider Notes (Signed)
[facility]    CSN: [id number] Arrival date & time: 03/27/20  1715      History   Chief Complaint Chief Complaint  Patient presents with  . Cough    x 1 week  . Nasal Congestion    x 1 week  . Shortness of Breath    x 1 week    HPI Carlos Day is a 64 y.o. male history of asthma, sleep apnea, DM type II, presenting today for evaluation of cough congestion and shortness of breath.  Reports symptoms for approximately 1 week.  Has had some wheezing shortness of breath and chest tightness at times.  Symptoms worsened of recently.  Denies any fevers.  Denies tobacco history.  Denies any close sick contacts.  HPI  Past Medical History:  Diagnosis Date  . Asthma   . Sleep apnea   . Unspecified essential hypertension     Patient Active Problem List   Diagnosis Date Noted  . Left leg swelling 05/02/2019  . Constipation 05/27/2018  . Hyperlipidemia associated with type 2 diabetes mellitus ([place]) 11/18/2016  . Morbid obesity ([place]) 11/17/2016  . Bilateral knee pain 05/27/2015  . Asthma with acute exacerbation 08/28/2014  . Controlled type 2 diabetes mellitus with circulatory disorder ( HTN, ED) ([place]) 03/01/2014  . Metabolic syndrome 73/22/0254  . ROTATOR CUFF SYNDROME, LEFT 10/09/2009  . 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 Allergic rhinitis 02/09/2007  . Hypertension associated with diabetes ([place]) 01/24/2007  . Obstructive sleep apnea 01/17/2007  . Asthma, moderate persistent 01/17/2007    Past Surgical History:  Procedure Laterality Date  . COLONOSCOPY         Home Medications    Prior to Admission medications   Medication Sig Start Date End Date Taking? Authorizing Provider  APPLE CIDER VINEGAR PO Take 1 tablet by mouth 2 (two) times daily.   Yes [provider]  aspirin 81 MG tablet Take 81 mg by mouth daily.     Yes [provider]  atorvastatin (LIPITOR) 10 MG tablet TAKE ONE TABLET BY MOUTH DAILY 07/28/19  Yes Bedsole, Amy E, MD    CINNAMON PO Take 1 tablet by mouth 2 (two) times daily.   Yes [provider]  Dulaglutide (TRULICITY) 1.5 YH/0.6CB SOPN Inject 1.5 mg into the skin once a week. 03/15/20  Yes Bedsole, Amy E, MD  Fluticasone-Salmeterol (ADVAIR) 100-50 MCG/DOSE AEPB INHALE ONE PUFF INTO THE LUNGS TWICE A DAY. RINSE MOUTH AFTER USE. 03/15/20  Yes Bedsole, Amy E, MD  lisinopril-hydrochlorothiazide (ZESTORETIC) 20-12.5 MG tablet Take 2 tablets by mouth daily. 03/15/20  Yes Bedsole, Amy E, MD  Multiple Vitamins-Minerals (MULTIVITAMIN WITH MINERALS) tablet Take 1 tablet by mouth daily.   Yes [provider]  Nebulizers (COMPRESSOR/NEBULIZER) MISC Use as directed 03/07/18  Yes Young, Tarri Fuller D, MD  albuterol (VENTOLIN HFA) 108 (90 Base) MCG/ACT inhaler Inhale 1-2 puffs into the lungs every 6 (six) hours as needed for wheezing or shortness of breath. 03/27/20   Lasharn Bufkin C, PA-C  azithromycin (ZITHROMAX) 250 MG tablet Take 1 tablet (250 mg total) by mouth daily. Take first 2 tablets together, then 1 every day until finished. 03/27/20   Sarkis Rhines C, PA-C  benzonatate (TESSALON) 200 MG capsule Take 1 capsule (200 mg total) by mouth 3 (three) times daily as needed for up to 7 days for cough. 03/27/20 04/03/20  Bellamarie Pflug C, PA-C  Insulin Pen Needle (PEN NEEDLES) 30G X 8 MM MISC Inject 0.75 mg into the skin  once a week. For trulicity weekly 01/00/71   Bedsole, Amy E, MD  ipratropium-albuterol (DUONEB) 0.5-2.5 (3) MG/3ML SOLN Take 3 mLs by nebulization every 6 (six) hours as needed. 03/27/20   Jeovanny Cuadros C, PA-C  predniSONE (DELTASONE) 50 MG tablet Take 1 tablet (50 mg total) by mouth daily with breakfast for 5 days. 03/27/20 04/01/20  Prajwal Fellner C, PA-C  triamcinolone cream (KENALOG) 0.5 % APPLY TOPICALLY TWO TIMES A DAY 05/18/19   Jinny Sanders, MD    Family History Family History  Problem Relation Age of Onset  . Breast cancer Mother   . Hypertension Brother   . Prostate cancer  Father   . Colon cancer Neg Hx   . Esophageal cancer Neg Hx   . Rectal cancer Neg Hx   . Stomach cancer Neg Hx     Social History Social History   Tobacco Use  . Smoking status: Never Smoker  . Smokeless tobacco: Never Used  Vaping Use  . Vaping Use: Never used  Substance Use Topics  . Alcohol use: No    Alcohol/week: 0.0 standard drinks  . Drug use: No     Allergies   Patient has no known allergies.   Review of Systems Review of Systems  Constitutional: Negative for activity change, appetite change, chills, fatigue and fever.  HENT: Positive for congestion, rhinorrhea and sinus pressure. Negative for ear pain, sore throat and trouble swallowing.   Eyes: Negative for discharge and redness.  Respiratory: Positive for cough, chest tightness and shortness of breath.   Cardiovascular: Negative for chest pain.  Gastrointestinal: Negative for abdominal pain, diarrhea, nausea and vomiting.  Musculoskeletal: Negative for myalgias.  Skin: Negative for rash.  Neurological: Negative for dizziness, light-headedness and headaches.     Physical Exam Triage Vital Signs ED Triage Vitals  Enc Vitals Group     BP 03/27/20 1745 134/84     Pulse Rate 03/27/20 1745 61     Resp 03/27/20 1745 19     Temp 03/27/20 1745 98.3 F (36.8 C)     Temp Source 03/27/20 1745 Oral     SpO2 03/27/20 1745 97 %     Weight --      Height --      Head Circumference --      Peak Flow --      Pain Score 03/27/20 1747 0     Pain Loc --      Pain Edu? --      Excl. in [place]? --    No data found.  Updated Vital Signs BP 134/84 (BP Location: Left Arm)   Pulse 61   Temp 98.3 F (36.8 C) (Oral)   Resp 19   SpO2 97%   Visual Acuity Right Eye Distance:   Left Eye Distance:   Bilateral Distance:    Right Eye Near:   Left Eye Near:    Bilateral Near:     Physical Exam Vitals and nursing note reviewed.  Constitutional:      Appearance: He is well-developed.     Comments: No acute distress   HENT:     Head: Normocephalic and atraumatic.     Ears:     Comments: Bilateral ears without tenderness to palpation of external auricle, tragus and mastoid, EAC's without erythema or swelling, TM's with good bony landmarks and cone of light. Non erythematous.     Nose: Nose normal.     Mouth/Throat:     Comments: Oral mucosa pink  and moist, no tonsillar enlargement or exudate. Posterior pharynx patent and nonerythematous, no uvula deviation or swelling. Normal phonation. Eyes:     Conjunctiva/sclera: Conjunctivae normal.  Cardiovascular:     Rate and Rhythm: Normal rate and regular rhythm.  Pulmonary:     Effort: Pulmonary effort is normal. No respiratory distress.     Comments: Breathing comfortably at rest, CTABL, no wheezing, rales or other adventitious sounds auscultated Abdominal:     General: There is no distension.  Musculoskeletal:        General: Normal range of motion.     Cervical back: Neck supple.  Skin:    General: Skin is warm and dry.  Neurological:     Mental Status: He is alert and oriented to person, place, and time.      UC Treatments / Results  Labs (all labs ordered are listed, but only abnormal results are displayed) Labs Reviewed  NOVEL CORONAVIRUS, NAA    EKG   Radiology DG Chest 2 View  Result Date: 03/27/2020 CLINICAL DATA:  64 year old male with cough and shortness of breath. EXAM: CHEST - 2 VIEW COMPARISON:  None. FINDINGS: Mild diffuse chronic interstitial coarsening. Faint confluent peripheral and subpleural densities concerning for atypical pneumonia. Clinical correlation is recommended. No pleural effusion pneumothorax. The cardiac silhouette is within limits. Atherosclerotic calcification of the aortic arch. No acute osseous pathology. IMPRESSION: Multifocal infiltrate concerning for atypical pneumonia. Clinical correlation is recommended. Electronically Signed   By: Anner Crete M.D.   On: 03/27/2020 18:27    Procedures Procedures  (including critical care time)  Medications Ordered in UC Medications  dexamethasone (DECADRON) 10 MG/ML injection for Pediatric ORAL use 16 mg (has no administration in time range)    Initial Impression / Assessment and Plan / UC Course  I have reviewed the triage vital signs and the nursing notes.  Pertinent labs & imaging results that were available during my care of the patient were reviewed by me and considered in my medical decision making (see chart for details).     Multifocal pneumonia/atypical pneumonia on chest x-ray, suspicious of Covid.  Covid test pending.  Placing on azithromycin to cover atypicals, Decadron prior to discharge and continuing on prednisone given reported wheezing and shortness of breath, albuterol inhaler as needed, may use nebulizers at home, rest and fluids.  Advised to go to emergency room if any symptoms persisting or worsening.   Discussed strict return precautions. Patient verbalized understanding and is agreeable with plan.  Final Clinical Impressions(s) / UC Diagnoses   Final diagnoses:  Atypical pneumonia  Shortness of breath     Discharge Instructions     X-ray does show multifocal pneumonia, this is often viral and we have seen this a lot with Covid I gave you a dose of Decadron today, may continue with prednisone for the next 5 days to help with shortness of breath/wheezing and inflammation in chest Albuterol inhaler as needed for shortness of breath and wheezing Begin azithromycin-2 tablets today, 1 tablet for the following 4 days Rest and fluids Follow-up if not improving or worsening    ED Prescriptions    Medication Sig Dispense Auth. Provider   predniSONE (DELTASONE) 50 MG tablet Take 1 tablet (50 mg total) by mouth daily with breakfast for 5 days. 5 tablet Sibyl Mikula C, PA-C   albuterol (VENTOLIN HFA) 108 (90 Base) MCG/ACT inhaler Inhale 1-2 puffs into the lungs every 6 (six) hours as needed for wheezing or shortness of  breath. [place]  g Lowell Mcgurk C, PA-C   azithromycin (ZITHROMAX) 250 MG tablet Take 1 tablet (250 mg total) by mouth daily. Take first 2 tablets together, then 1 every day until finished. 6 tablet Tigran Haynie C, PA-C   benzonatate (TESSALON) 200 MG capsule Take 1 capsule (200 mg total) by mouth 3 (three) times daily as needed for up to 7 days for cough. 28 capsule Edwena Mayorga C, PA-C   ipratropium-albuterol (DUONEB) 0.5-2.5 (3) MG/3ML SOLN Take 3 mLs by nebulization every 6 (six) hours as needed. 175 mL Miloh Alcocer, [place] C, PA-C     PDMP not reviewed this encounter.   Janith Lima, [place] 03/27/20 1853

## 2020-03-28 NOTE — Telephone Encounter (Signed)
Agree with dispo 

## 2020-03-29 LAB — NOVEL CORONAVIRUS, NAA: SARS-CoV-2, NAA: NOT DETECTED

## 2020-03-29 LAB — SARS-COV-2, NAA 2 DAY TAT

## 2020-08-05 ENCOUNTER — Other Ambulatory Visit: Payer: Self-pay | Admitting: Family Medicine

## 2020-09-10 ENCOUNTER — Telehealth: Payer: Self-pay | Admitting: Family Medicine

## 2020-09-10 DIAGNOSIS — E1159 Type 2 diabetes mellitus with other circulatory complications: Secondary | ICD-10-CM

## 2020-09-10 NOTE — Telephone Encounter (Signed)
-----   Message from Ellamae Sia sent at 08/26/2020  2:29 PM EDT ----- Regarding: Lab orders for Thursday, 5.19.22 Patient is scheduled for CPX labs, please order future labs, Thanks , Karna Christmas

## 2020-09-12 ENCOUNTER — Other Ambulatory Visit: Payer: Self-pay

## 2020-09-12 ENCOUNTER — Other Ambulatory Visit (INDEPENDENT_AMBULATORY_CARE_PROVIDER_SITE_OTHER): Payer: Self-pay

## 2020-09-12 DIAGNOSIS — E1159 Type 2 diabetes mellitus with other circulatory complications: Secondary | ICD-10-CM

## 2020-09-12 LAB — LIPID PANEL
Cholesterol: 139 mg/dL (ref 0–200)
HDL: 34.2 mg/dL — ABNORMAL LOW (ref >39.00)
LDL Cholesterol: 87 mg/dL (ref 0–99)
NonHDL: 104.91
Total CHOL/HDL Ratio: 4
Triglycerides: 91 mg/dL (ref 0.0–149.0)
VLDL: 18.2 mg/dL (ref 0.0–40.0)

## 2020-09-12 LAB — COMPREHENSIVE METABOLIC PANEL
ALT: 34 U/L (ref 0–53)
AST: 24 U/L (ref 0–37)
Albumin: 4.3 g/dL (ref 3.5–5.2)
Alkaline Phosphatase: 57 U/L (ref 39–117)
BUN: 12 mg/dL (ref 6–23)
CO2: 31 mEq/L (ref 19–32)
Calcium: 9.7 mg/dL (ref 8.4–10.5)
Chloride: 103 mEq/L (ref 96–112)
Creatinine, Ser: 1.02 mg/dL (ref 0.40–1.50)
GFR: 77.87 mL/min (ref >60.00)
Glucose, Bld: 103 mg/dL — ABNORMAL HIGH (ref 70–99)
Potassium: 4.1 mEq/L (ref 3.5–5.1)
Sodium: 140 mEq/L (ref 135–145)
Total Bilirubin: 0.5 mg/dL (ref 0.2–1.2)
Total Protein: 6.9 g/dL (ref 6.0–8.3)

## 2020-09-12 LAB — HEMOGLOBIN A1C: Hgb A1c MFr Bld: 6.6 % — ABNORMAL HIGH (ref 4.6–6.5)

## 2020-09-12 NOTE — Progress Notes (Signed)
No critical labs need to be addressed urgently. We will discuss labs in detail at upcoming office visit.   

## 2020-09-17 ENCOUNTER — Ambulatory Visit (INDEPENDENT_AMBULATORY_CARE_PROVIDER_SITE_OTHER): Payer: BC Managed Care – PPO | Admitting: Family Medicine

## 2020-09-17 ENCOUNTER — Other Ambulatory Visit: Payer: Self-pay

## 2020-09-17 ENCOUNTER — Encounter: Payer: Self-pay | Admitting: Family Medicine

## 2020-09-17 VITALS — BP 132/90 | HR 53 | Temp 96.5°F | Ht 70.0 in | Wt 300.8 lb

## 2020-09-17 DIAGNOSIS — E1159 Type 2 diabetes mellitus with other circulatory complications: Secondary | ICD-10-CM

## 2020-09-17 DIAGNOSIS — J4541 Moderate persistent asthma with (acute) exacerbation: Secondary | ICD-10-CM

## 2020-09-17 DIAGNOSIS — E785 Hyperlipidemia, unspecified: Secondary | ICD-10-CM

## 2020-09-17 DIAGNOSIS — Z0000 Encounter for general adult medical examination without abnormal findings: Secondary | ICD-10-CM

## 2020-09-17 DIAGNOSIS — E1169 Type 2 diabetes mellitus with other specified complication: Secondary | ICD-10-CM

## 2020-09-17 DIAGNOSIS — I152 Hypertension secondary to endocrine disorders: Secondary | ICD-10-CM

## 2020-09-17 MED ORDER — FLUTICASONE-SALMETEROL 100-50 MCG/ACT IN AEPB: 1.0000 | INHALATION_SPRAY | Freq: Two times a day (BID) | RESPIRATORY_TRACT | 11 refills | Status: DC

## 2020-09-17 MED ORDER — PREDNISONE 20 MG PO TABS: ORAL_TABLET | ORAL | 0 refills | Status: DC

## 2020-09-17 NOTE — Assessment & Plan Note (Signed)
Stable, chronic.  Continue current medication.    Continue trulicity 1.5 mg weekly... consider increase to 3  mg weekly at next OV  Weight not coming off.

## 2020-09-17 NOTE — Assessment & Plan Note (Addendum)
Poor control. Allergy trigger: change to Zyrtec.  Start back Advair... given current chest tightness, wheeze and DOE.. treat with prednisone taper. Use nebs or albuterol prn.  Follow up as planned with pulmonary.   May need referral for med assistance if issue affording Advair returns.

## 2020-09-17 NOTE — Assessment & Plan Note (Signed)
Stable, chronic.  Continue current medication.   lisinopril HCTZ 20/12.5 mg daily

## 2020-09-17 NOTE — Assessment & Plan Note (Signed)
Stable, chronic.  Continue current medication.   Atorvastatin 10 mg daily 

## 2020-09-17 NOTE — Progress Notes (Signed)
Patient ID: Carlos Day, male    DOB: 06/14/1955, 65 y.o.   MRN: [id number]  This visit was conducted in person.  BP 132/90   Pulse (!) 53   Temp (!) 96.5 F (35.8 C) (Temporal)   Ht 5\' 10"  (1.778 m)   Wt (!) 300 lb 12 oz (136.4 kg)   SpO2 96%   BMI 43.15 kg/m    CC:  Chief Complaint  Patient presents with  . Annual Exam    Subjective:   HPI: Carlos Day is a 65 y.o. male presenting on 09/17/2020 for Annual Exam   Asthma, moderate persistent. Allergies have been worse lately and has been out of Advair.  SOB has bee worse lately.  He is using OTC  Zyrtec.  followed by pulmonary in 10/2020.  Diabetes: At goal on Trulicity ... was out of this for 1 month. Lab Results  Component Value Date   HGBA1C 6.6 (H) 09/12/2020  Using medications without difficulties: Hypoglycemic episodes:none Hyperglycemic episodes: none Feet problems: non ulcer Blood Sugars averaging: not checking eye exam within last year:  Hypertension:    On lisinopril HCTZ 20/12.5 mg daily BP Readings from Last 3 Encounters:  09/17/20 132/90  03/27/20 134/84  03/15/20 126/82   On lisinoprilUsing medication without problems or lightheadedness:  Chest pain with exertion: Edema: Short of breath: Average home BPs: Other issues:  Elevated Cholesterol:  At goal LD < 100 on atorvastatin 10 mg  Lab Results  Component Value Date   CHOL 139 09/12/2020   HDL 34.20 (L) 09/12/2020   LDLCALC 87 09/12/2020   LDLDIRECT 125.0 11/17/2016   TRIG 91.0 09/12/2020   CHOLHDL 4 09/12/2020  Using medications without problems: Muscle aches:  Diet compliance: moderate Exercise: minimal given SOB Other complaints:  Morbid obesity  Wt Readings from Last 3 Encounters:  09/17/20 (!) 300 lb 12 oz (136.4 kg)  03/15/20 284 lb 4 oz (128.9 kg)  11/10/19 287 lb 9.6 oz (130.5 kg)         Relevant past medical, surgical, family and social history reviewed and updated as indicated. Interim medical history since our  last visit reviewed. Allergies and medications reviewed and updated. Outpatient Medications Prior to Visit  Medication Sig Dispense Refill  . albuterol (VENTOLIN HFA) 108 (90 Base) MCG/ACT inhaler Inhale 1-2 puffs into the lungs every 6 (six) hours as needed for wheezing or shortness of breath. 18 g 0  . APPLE CIDER VINEGAR PO Take 1 tablet by mouth 2 (two) times daily.    Marland Kitchen aspirin 81 MG tablet Take 81 mg by mouth daily.    Marland Kitchen atorvastatin (LIPITOR) 10 MG tablet TAKE ONE TABLET BY MOUTH DAILY 30 tablet 5  . Dulaglutide (TRULICITY) 1.5 GH/8.2XH SOPN Inject 1.5 mg into the skin once a week. 3 mL 11  . Insulin Pen Needle (PEN NEEDLES) 30G X 8 MM MISC Inject 0.75 mg into the skin once a week. For trulicity weekly 371 each 11  . ipratropium-albuterol (DUONEB) 0.5-2.5 (3) MG/3ML SOLN Take 3 mLs by nebulization every 6 (six) hours as needed. 175 mL 0  . lisinopril-hydrochlorothiazide (ZESTORETIC) 20-12.5 MG tablet Take 2 tablets by mouth daily. 60 tablet 11  . Multiple Vitamins-Minerals (MULTIVITAMIN WITH MINERALS) tablet Take 1 tablet by mouth daily.    . Nebulizers (COMPRESSOR/NEBULIZER) MISC Use as directed 1 each 0  . Fluticasone-Salmeterol (ADVAIR) 100-50 MCG/DOSE AEPB INHALE ONE PUFF INTO THE LUNGS TWICE A DAY. RINSE MOUTH AFTER USE. 60 each 3  .  azithromycin (ZITHROMAX) 250 MG tablet Take 1 tablet (250 mg total) by mouth daily. Take first 2 tablets together, then 1 every day until finished. 6 tablet 0  . CINNAMON PO Take 1 tablet by mouth 2 (two) times daily.    Marland Kitchen triamcinolone cream (KENALOG) 0.5 % APPLY TOPICALLY TWO TIMES A DAY 30 g 0   No facility-administered medications prior to visit.     Per HPI unless specifically indicated in ROS section below Review of Systems  Constitutional: Negative for fatigue and fever.  HENT: Negative for ear pain.   Eyes: Negative for pain.  Respiratory: Positive for shortness of breath and wheezing. Negative for cough.   Cardiovascular: Negative for  chest pain, palpitations and leg swelling.  Gastrointestinal: Negative for abdominal pain.  Genitourinary: Negative for dysuria.  Musculoskeletal: Negative for arthralgias.  Neurological: Negative for syncope, light-headedness and headaches.  Psychiatric/Behavioral: Negative for dysphoric mood.   Objective:  BP 132/90   Pulse (!) 53   Temp (!) 96.5 F (35.8 C) (Temporal)   Ht 5\' 10"  (1.778 m)   Wt (!) 300 lb 12 oz (136.4 kg)   SpO2 96%   BMI 43.15 kg/m   Wt Readings from Last 3 Encounters:  09/17/20 (!) 300 lb 12 oz (136.4 kg)  03/15/20 284 lb 4 oz (128.9 kg)  11/10/19 287 lb 9.6 oz (130.5 kg)      Physical Exam Constitutional:      Appearance: He is well-developed.  HENT:     Head: Normocephalic.     Right Ear: Hearing normal.     Left Ear: Hearing normal.     Nose: Nose normal.  Neck:     Thyroid: No thyroid mass or thyromegaly.     Vascular: No carotid bruit.     Trachea: Trachea normal.  Cardiovascular:     Rate and Rhythm: Normal rate and regular rhythm.     Pulses: Normal pulses.     Heart sounds: Heart sounds not distant. No murmur heard. No friction rub. No gallop.      Comments: No peripheral edema Pulmonary:     Effort: Pulmonary effort is normal. No respiratory distress.     Breath sounds: Examination of the right-upper field reveals wheezing. Examination of the left-upper field reveals wheezing. Examination of the right-middle field reveals wheezing. Examination of the left-middle field reveals wheezing. Examination of the right-lower field reveals wheezing. Examination of the left-lower field reveals wheezing. Wheezing present.  Skin:    General: Skin is warm and dry.     Findings: No rash.  Psychiatric:        Speech: Speech normal.        Behavior: Behavior normal.        Thought Content: Thought content normal.      Diabetic foot exam: Normal inspection No skin breakdown No calluses  Normal DP pulses Normal sensation to light touch and  monofilament Nails normal     Results for orders placed or performed in visit on 09/12/20  Comprehensive metabolic panel  Result Value Ref Range   Sodium 140 135 - 145 mEq/L   Potassium 4.1 3.5 - 5.1 mEq/L   Chloride 103 96 - 112 mEq/L   CO2 31 19 - 32 mEq/L   Glucose, Bld 103 (H) 70 - 99 mg/dL   BUN 12 6 - 23 mg/dL   Creatinine, Ser 1.02 0.40 - 1.50 mg/dL   Total Bilirubin 0.5 0.2 - 1.2 mg/dL   Alkaline Phosphatase 57 39 -  117 U/L   AST 24 0 - 37 U/L   ALT 34 0 - 53 U/L   Total Protein 6.9 6.0 - 8.3 g/dL   Albumin 4.3 3.5 - 5.2 g/dL   GFR 77.87 >60.00 mL/min   Calcium 9.7 8.4 - 10.5 mg/dL  Lipid panel  Result Value Ref Range   Cholesterol 139 0 - 200 mg/dL   Triglycerides 91.0 0.0 - 149.0 mg/dL   HDL 34.20 (L) >39.00 mg/dL   VLDL 18.2 0.0 - 40.0 mg/dL   LDL Cholesterol 87 0 - 99 mg/dL   Total CHOL/HDL Ratio 4    NonHDL 104.91   Hemoglobin A1c  Result Value Ref Range   Hgb A1c MFr Bld 6.6 (H) 4.6 - 6.5 %    This visit occurred during the SARS-CoV-2 public health emergency.  Safety protocols were in place, including screening questions prior to the visit, additional usage of staff PPE, and extensive cleaning of exam room while observing appropriate contact time as indicated for disinfecting solutions.   COVID 19 screen:  No recent travel or known exposure to COVID19 The patient denies respiratory symptoms of COVID 19 at this time. The importance of social distancing was discussed today.   Assessment and Plan The patient's preventative maintenance and recommended screening tests for an annual wellness exam were reviewed in full today. Brought up to date unless services declined.  Counselled on the importance of diet, exercise, and its role in overall health and mortality. The patient's FH and SH was reviewed, including their home life, tobacco status, and drug and alcohol status.   Due for eye exam.  Refused flu vaccines given intolerant, uptodate with td and PNA  vaccines. Lab Results  Component Value Date   PSA 0.48 03/12/2020   PSA 0.47 05/03/2019   PSA 0.56 04/18/2018  Colon Ca [place]:  no family colon cancer. Last colon in 2014, sessile polyp Dr. Eugenia Pancoast, repeat was due 2024.  Hep C: done  HIV : done  nonsmoker Problem List Items Addressed This Visit    Asthma with acute exacerbation    Poor control. Allergy trigger: change to Zyrtec.  Start back Advair... given current chest tightness, wheeze and DOE.. treat with prednisone taper. Use nebs or albuterol prn.  Follow up as planned with pulmonary.   May need referral for med assistance if issue affording Advair returns.      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  Continue current medication.    Continue trulicity 1.5 mg weekly... consider increase to 3  mg weekly at next OV  Weight not coming off.      Hyperlipidemia associated with type 2 diabetes mellitus (HCC)    Stable, chronic.  Continue current medication.   Atorvastatin 10 mg daily      Hypertension associated with diabetes (HCC) (Chronic)    Stable, chronic.  Continue current medication.   lisinopril HCTZ 20/12.5 mg daily      Morbid obesity (HCC) (Chronic)    Encouraged exercise, weight loss, healthy eating habits.        Other Visit Diagnoses    Routine general medical examination at a health care facility    -  Primary         Eliezer Lofts, MD

## 2020-09-17 NOTE — Assessment & Plan Note (Signed)
Encouraged exercise, weight loss, healthy eating habits. ? ?

## 2020-09-17 NOTE — Patient Instructions (Addendum)
Take generic Zyrtec at bedtime.  Restart Advair 1 puff 2 times daily.  Complete prednisone taper. Set up yearly eye exam for diabetes and have the opthalmologist send [place] a copy of the evaluation for the chart.

## 2020-11-10 NOTE — Progress Notes (Signed)
Subjective:    Patient ID: Carlos Day, male    DOB: March 27, 1956, 65 y.o.   MRN: [id number]  HPI male never smoker followed for OSA, asthma, complicated by HBP NPSG 12/05/15  AHI 99.3/ hr, desaturation to 72%,    Body weight 260 lbs Office Spirometry 11/01/16-moderately severe obstructive airways disease with restriction of exhaled volume. FVC 2.96/60%, FEV1 2.14/57%, ratio 0.7 to, FEF 25-75% 1.50/49% -----------------------------------------------------------------------   11/10/19- 65 year old male never smoker followed for OSA, asthma, complicated by HBP CPAP 51/ [facility] compliance 77%, AHI 3.8/ hr- uses every night, but some short nights Body weight today 287 lbs Reports short sleep nights. ASks DME closer to him in [place]. Machine is over 82 yrs old.  Had J&J Covax Asthma controlled with Advair, occ albuterol hfa.  11/11/20- 65 year old male never smoker followed for OSA, Asthma, complicated by HBP - neb Duoneb, Ventolin hfa, Advair 100,  Prednisone taper 5/22 from PCP CPAP 11/ [facility]        waiting on replacement due to cost (deductible) Download- compliance 83%, AHI 4.4/ hr Body weight today-293 lbs Covid vax- J&J -----Patient is sleeping good and machine is working good. Still using his old machine, has not got the new machine yet due to cost.  ED 03/27/20- Pneumonia ? Covid> Zpak, decadron, prednisone He can't afford copay for replacement CPAP (250$) right now, but old machine is working ok.  Advair costs him 50$, which is also hard for him but it works well.  Wheezing and chest-tightness are occasional. He feels well today and completely resolved his pneumonia from last winter. CXR 03/27/20-  IMPRESSION: Multifocal infiltrate concerning for atypical pneumonia. Clinical correlation is recommended.  ROS-see HPI + = positive Constitutional:   + weight loss, night sweats, fevers, chills, fatigue, lassitude. HEENT:   No-  headaches, difficulty  swallowing, tooth/dental problems, sore throat,       No-  sneezing, itching, ear ache, nasal congestion, post nasal drip,  CV:  No-   chest pain, orthopnea, PND, swelling in lower extremities, anasarca, dizziness, palpitations Resp: +   shortness of breath with exertion or at rest.              No-   productive cough,  No non-productive cough,  No- coughing up of blood.              No-   change in color of mucus.  +little wheezing.   Skin: No-   rash or lesions. GI:  No-   heartburn, indigestion, abdominal pain, nausea, vomiting,  GU: . MS:  No-   joint pain or swelling. . Neuro-     nothing unusual Psych:  No- change in mood or affect. No depression or anxiety.  No memory loss.  OBJ- Physical Exam General- Alert, Oriented, Affect-appropriate, Distress- none acute, + overweight/ muscular Skin- rash-none, lesions- none, excoriation- none Lymphadenopathy- none Head- atraumatic            Eyes- Gross vision intact, PERRLA, conjunctivae and secretions clear            Ears- Hearing, canals-normal            Nose- Clear, no-Septal dev, mucus, polyps, erosion, perforation             Throat- Mallampati III , mucosa clear , drainage- none, tonsils- atrophic Neck- flexible , trachea midline, no stridor , thyroid nl, carotid no bruit Chest - symmetrical excursion , unlabored  Heart/CV- RRR , no murmur , no gallop  , no rub, nl s1 s2                           - JVD- none , edema- none, stasis changes- none, varices- none           Lung- + clear, cough- none , dullness-none, rub- none           Chest wall-  Abd-  Br/ Gen/ Rectal- Not done, not indicated Extrem- cyanosis- none, clubbing, none, atrophy- none, strength- nl Neuro- grossly intact to observation

## 2020-11-11 ENCOUNTER — Encounter: Payer: Self-pay | Admitting: Internal Medicine

## 2020-11-11 ENCOUNTER — Other Ambulatory Visit: Payer: Self-pay

## 2020-11-11 ENCOUNTER — Ambulatory Visit (INDEPENDENT_AMBULATORY_CARE_PROVIDER_SITE_OTHER): Payer: BC Managed Care – PPO | Admitting: Internal Medicine

## 2020-11-11 DIAGNOSIS — G4733 Obstructive sleep apnea (adult) (pediatric): Secondary | ICD-10-CM | POA: Diagnosis not present

## 2020-11-11 DIAGNOSIS — J4540 Moderate persistent asthma, uncomplicated: Secondary | ICD-10-CM

## 2020-11-11 MED ORDER — TRELEGY ELLIPTA 100-62.5-25 MCG/INH IN AEPB: 1.0000 | INHALATION_SPRAY | Freq: Every day | RESPIRATORY_TRACT | 0 refills | Status: DC

## 2020-11-11 NOTE — Patient Instructions (Signed)
We can continue CPAP 11  You can talk with [facility] about replacing your old CPAP machine when you can.  Order- sample x 2 Trelegy 100     inhale 1 puff then rinse mouth, once daily Try this instead of Advair. When it runs out, go back to Advair and see if one works better.  Please call us as needed

## 2020-11-11 NOTE — Assessment & Plan Note (Signed)
In some distress when he had pneumonia last winter- had run out of Advair then. Don't know if he might be candidate for manufacturer's support of another product. Plan- sample Trelegy. Consider this as an alternative to Advair.

## 2020-11-11 NOTE — Assessment & Plan Note (Signed)
Benefits from CPAP. Current machine getting old and he has been approved for replacement, but can't afford the co-pay for now.  Plan- replace old machine when able.

## 2020-12-20 ENCOUNTER — Other Ambulatory Visit: Payer: Self-pay

## 2020-12-20 ENCOUNTER — Encounter: Payer: Self-pay | Admitting: Family Medicine

## 2020-12-20 ENCOUNTER — Ambulatory Visit (INDEPENDENT_AMBULATORY_CARE_PROVIDER_SITE_OTHER): Payer: BC Managed Care – PPO | Admitting: Family Medicine

## 2020-12-20 VITALS — BP 126/80 | HR 52 | Temp 98.5°F | Ht 70.0 in | Wt 296.5 lb

## 2020-12-20 DIAGNOSIS — I152 Hypertension secondary to endocrine disorders: Secondary | ICD-10-CM | POA: Diagnosis not present

## 2020-12-20 DIAGNOSIS — E1159 Type 2 diabetes mellitus with other circulatory complications: Secondary | ICD-10-CM | POA: Diagnosis not present

## 2020-12-20 LAB — POCT GLYCOSYLATED HEMOGLOBIN (HGB A1C): Hemoglobin A1C: 6 % — AB (ref 4.0–5.6)

## 2020-12-20 MED ORDER — TRULICITY 3 MG/0.5ML ~~LOC~~ SOAJ: 3.0000 mg | SUBCUTANEOUS | 11 refills | Status: DC

## 2020-12-20 NOTE — Progress Notes (Signed)
Patient ID: Carlos Day, male    DOB: 12/10/1955, 65 y.o.   MRN: [id number]  This visit was conducted in person.  BP (!) 160/90   Pulse (!) 52   Temp 98.5 F (36.9 C) (Temporal)   Ht '5\' 10"'$  (1.778 m)   Wt 296 lb 8 oz (134.5 kg)   SpO2 97%   BMI 42.54 kg/m    CC: Chief Complaint  Patient presents with   Diabetes    Subjective:   HPI: Carlos Day is a 65 y.o. male presenting on 12/20/2020 for Diabetes   Diabetes:   Very good control on Trulicity 1.5 mg weekly Lab Results  Component Value Date   HGBA1C 6.0 (A) 12/20/2020  Using medications without difficulties: none Hypoglycemic episodes: Hyperglycemic episodes: Feet problems: none Blood Sugars averaging: eye exam within last year: Due   Exercising: 2-5 times a week.. walking.  He has been working on eating healthy diet.Marland Kitchen eating whole where bread, 2 diet sodas a day.   Wt Readings from Last 3 Encounters:  12/20/20 296 lb 8 oz (134.5 kg)  11/11/20 293 lb (132.9 kg)  09/17/20 (!) 300 lb 12 oz (136.4 kg)      Hypertension:   Not at goal in office today   On  2 tablets daily lisinopril HCTZ 20/12.5 mg daily  BP Readings from Last 3 Encounters:  12/20/20 (!) 160/90  11/11/20 132/72  09/17/20 132/90  Using medication without problems or lightheadedness:  none Chest pain with exertion:none Edema:none Short of breath:none Average home BPs: not checking lately. Other issues:   Relevant past medical, surgical, family and social history reviewed and updated as indicated. Interim medical history since our last visit reviewed. Allergies and medications reviewed and updated. Outpatient Medications Prior to Visit  Medication Sig Dispense Refill   albuterol (VENTOLIN HFA) 108 (90 Base) MCG/ACT inhaler Inhale 1-2 puffs into the lungs every 6 (six) hours as needed for wheezing or shortness of breath. 18 g 0   APPLE CIDER VINEGAR PO Take 1 tablet by mouth 2 (two) times daily.     aspirin 81 MG tablet Take 81 mg by mouth  daily.     atorvastatin (LIPITOR) 10 MG tablet TAKE ONE TABLET BY MOUTH DAILY 30 tablet 5   Dulaglutide (TRULICITY) 1.5 0000000 SOPN Inject 1.5 mg into the skin once a week. 3 mL 11   Fluticasone-Umeclidin-Vilant (TRELEGY ELLIPTA) 100-62.5-25 MCG/INH AEPB Inhale 1 puff into the lungs daily. 28 each 0   Insulin Pen Needle (PEN NEEDLES) 30G X 8 MM MISC Inject 0.75 mg into the skin once a week. For trulicity weekly [id number] each 11   ipratropium-albuterol (DUONEB) 0.5-2.5 (3) MG/3ML SOLN Take 3 mLs by nebulization every 6 (six) hours as needed. 175 mL 0   lisinopril-hydrochlorothiazide (ZESTORETIC) 20-12.5 MG tablet Take 2 tablets by mouth daily. 60 tablet 11   Multiple Vitamins-Minerals (MULTIVITAMIN WITH MINERALS) tablet Take 1 tablet by mouth daily.     Nebulizers (COMPRESSOR/NEBULIZER) MISC Use as directed 1 each 0   fluticasone-salmeterol (ADVAIR) 100-50 MCG/ACT AEPB Inhale 1 puff into the lungs 2 (two) times daily. (Patient not taking: Reported on 12/20/2020) 60 each 11   No facility-administered medications prior to visit.     Per HPI unless specifically indicated in ROS section below Review of Systems  Constitutional:  Negative for fatigue and fever.  HENT:  Negative for ear pain.   Eyes:  Negative for pain.  Respiratory:  Negative for cough and shortness of breath.  Cardiovascular:  Negative for chest pain, palpitations and leg swelling.  Gastrointestinal:  Negative for abdominal pain.  Genitourinary:  Negative for dysuria.  Musculoskeletal:  Negative for arthralgias.  Neurological:  Negative for syncope, light-headedness and headaches.  Psychiatric/Behavioral:  Negative for dysphoric mood.   Objective:  BP (!) 160/90   Pulse (!) 52   Temp 98.5 F (36.9 C) (Temporal)   Ht '5\' 10"'$  (1.778 m)   Wt 296 lb 8 oz (134.5 kg)   SpO2 97%   BMI 42.54 kg/m   Wt Readings from Last 3 Encounters:  12/20/20 296 lb 8 oz (134.5 kg)  11/11/20 293 lb (132.9 kg)  09/17/20 (!) 300 lb 12 oz (136.4  kg)      Physical Exam Constitutional:      Appearance: He is well-developed.  HENT:     Head: Normocephalic.     Right Ear: Hearing normal.     Left Ear: Hearing normal.     Nose: Nose normal.  Neck:     Thyroid: No thyroid mass or thyromegaly.     Vascular: No carotid bruit.     Trachea: Trachea normal.  Cardiovascular:     Rate and Rhythm: Normal rate and regular rhythm.     Pulses: Normal pulses.     Heart sounds: Heart sounds not distant. No murmur heard.   No friction rub. No gallop.     Comments: No peripheral edema Pulmonary:     Effort: Pulmonary effort is normal. No respiratory distress.     Breath sounds: Normal breath sounds.  Skin:    General: Skin is warm and dry.     Findings: No rash.  Psychiatric:        Speech: Speech normal.        Behavior: Behavior normal.        Thought Content: Thought content normal.   Diabetic foot exam: Normal inspection No skin breakdown No calluses  Normal DP pulses Normal sensation to light touch and monofilament Nails normal     Results for orders placed or performed in visit on 12/20/20  POCT glycosylated hemoglobin (Hb A1C)  Result Value Ref Range   Hemoglobin A1C 6.0 (A) 4.0 - 5.6 %   HbA1c POC (<> result, manual entry)     HbA1c, POC (prediabetic range)     HbA1c, POC (controlled diabetic range)      This visit occurred during the SARS-CoV-2 public health emergency.  Safety protocols were in place, including screening questions prior to the visit, additional usage of staff PPE, and extensive cleaning of exam room while observing appropriate contact time as indicated for disinfecting solutions.   COVID 19 screen:  No recent travel or known exposure to COVID19 The patient denies respiratory symptoms of COVID 19 at this time. The importance of social distancing was discussed today.   Assessment and Plan    Problem List Items Addressed This Visit     Hypertension associated with diabetes ([place]) (Chronic)     Stable, poor control in office ? At home.  Continue current medication.  Elevated in office today despite meds.  Follow BP at home.. call with measurements.   Lisinopril 2 tabs 20/12.5 mg daily      Relevant Medications   Dulaglutide (TRULICITY) 3 0000000 SOPN   Morbid obesity (HCC) (Chronic)    Doing very well on regular exercise and diet changes.  Increase Trulicity to 3 mg weekly for weight loss.      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  Continue current medication.   Excellent control  Trulicity.. will increase dose to 3 mg weekly for further weight loss.      Relevant Medications   Dulaglutide (TRULICITY) 3 0000000 SOPN   Other Relevant Orders   POCT glycosylated hemoglobin (Hb A1C) (Completed)     Eliezer Lofts, MD

## 2020-12-20 NOTE — Assessment & Plan Note (Signed)
Stable, chronic.  Continue current medication.   Excellent control  Trulicity.. will increase dose to 3 mg weekly for further weight loss.

## 2020-12-20 NOTE — Patient Instructions (Addendum)
Increase Trulcity to 3 mg weekly.  Keep working on healthy eating and regular exercise.  Follow BP at home..  call or email BP measurements in the 2 weeks or so. Set up yearly eye exam for diabetes and have the opthalmologist send [place] a copy of the evaluation for the chart.

## 2020-12-20 NOTE — Assessment & Plan Note (Signed)
Stable, poor control in office ? At home.  Continue current medication.  Elevated in office today despite meds.  Follow BP at home.. call with measurements.   Lisinopril 2 tabs 20/12.5 mg daily

## 2020-12-20 NOTE — Assessment & Plan Note (Signed)
Doing very well on regular exercise and diet changes.  Increase Trulicity to 3 mg weekly for weight loss.

## 2021-03-15 ENCOUNTER — Other Ambulatory Visit: Payer: Self-pay | Admitting: Family Medicine

## 2021-03-25 ENCOUNTER — Other Ambulatory Visit: Payer: Self-pay

## 2021-03-25 ENCOUNTER — Encounter: Payer: Self-pay | Admitting: Family Medicine

## 2021-03-25 ENCOUNTER — Ambulatory Visit (INDEPENDENT_AMBULATORY_CARE_PROVIDER_SITE_OTHER): Payer: BC Managed Care – PPO | Admitting: Family Medicine

## 2021-03-25 VITALS — BP 130/80 | HR 62 | Temp 98.0°F | Ht 70.0 in | Wt 289.1 lb

## 2021-03-25 DIAGNOSIS — I152 Hypertension secondary to endocrine disorders: Secondary | ICD-10-CM

## 2021-03-25 DIAGNOSIS — E1159 Type 2 diabetes mellitus with other circulatory complications: Secondary | ICD-10-CM | POA: Diagnosis not present

## 2021-03-25 LAB — POCT GLYCOSYLATED HEMOGLOBIN (HGB A1C): Hemoglobin A1C: 6 % — AB (ref 4.0–5.6)

## 2021-03-25 NOTE — Assessment & Plan Note (Signed)
BP  at goal on 2 tablets daily of lisinopril HCTZ 20/12.5 mg daily

## 2021-03-25 NOTE — Assessment & Plan Note (Addendum)
Improving on 3 mg Trulicity and with daily exercise.  Continue current dose and increase exercise and diet change.

## 2021-03-25 NOTE — Patient Instructions (Addendum)
Work on [employer] size. Make heart healthy food choices.Marland Kitchen low fat, low carb, lots of vegetables.  Avoid greasy food acidic foods.  Continue walking, increase distance or speed to get more weight loss.

## 2021-03-25 NOTE — Assessment & Plan Note (Addendum)
Stable excellent control   A1C 6.0

## 2021-03-25 NOTE — Progress Notes (Signed)
Patient ID: Carlos Day, male    DOB: December 25, 1955, 65 y.o.   MRN: [id number]  This visit was conducted in person.  BP 130/80   Pulse 62   Temp 98 F (36.7 C) (Temporal)   Ht 5\' 10"  (1.778 m)   Wt 289 lb 2 oz (131.1 kg)   SpO2 98%   BMI 41.49 kg/m    CC: Chief Complaint  Patient presents with   Follow-up    Weight Management/Trulicity    Subjective:   HPI: Carlos Day is a 65 y.o. male  with obesity and diabetes presenting on 03/25/2021 for Follow-up (Weight Management/Trulicity)  At last OV on 0/12/3816 increased Trulicity to 3 mg weekly.  He has lost 7 lbs since last OV.  No Side effects.. He does have heartburn with beef and cinnamon now   He has noted decreased appetite.  He has been walking a mile day. Had trouble sticking to the diet through the Thanksgiving.   Wt Readings from Last 3 Encounters:  03/25/21 289 lb 2 oz (131.1 kg)  12/20/20 296 lb 8 oz (134.5 kg)  11/11/20 293 lb (132.9 kg)   Hypertension:   BP  at goal on 2 tablets daily of lisinopril HCTZ 20/12.5 mg daily  BP Readings from Last 3 Encounters:  03/25/21 130/80  12/20/20 126/80  11/11/20 132/72  sing medication without problems or lightheadedness:  Chest pain with exertion: Edema: Short of breath: Average home BPs: Other issues:       Relevant past medical, surgical, family and social history reviewed and updated as indicated. Interim medical history since our last visit reviewed. Allergies and medications reviewed and updated. Outpatient Medications Prior to Visit  Medication Sig Dispense Refill   albuterol (VENTOLIN HFA) 108 (90 Base) MCG/ACT inhaler Inhale 1-2 puffs into the lungs every 6 (six) hours as needed for wheezing or shortness of breath. 18 g 0   APPLE CIDER VINEGAR PO Take 1 tablet by mouth 2 (two) times daily.     aspirin 81 MG tablet Take 81 mg by mouth daily.     atorvastatin (LIPITOR) 10 MG tablet TAKE ONE TABLET BY MOUTH DAILY 30 tablet 5   Dulaglutide (TRULICITY) 3  EX/9.3ZJ SOPN Inject 3 mg as directed once a week. 3 mL 11   fluticasone-salmeterol (ADVAIR) 100-50 MCG/ACT AEPB Inhale 1 puff into the lungs 2 (two) times daily. 60 each 11   Insulin Pen Needle (PEN NEEDLES) 30G X 8 MM MISC Inject 0.75 mg into the skin once a week. For trulicity weekly 696 each 11   ipratropium-albuterol (DUONEB) 0.5-2.5 (3) MG/3ML SOLN Take 3 mLs by nebulization every 6 (six) hours as needed. 175 mL 0   lisinopril-hydrochlorothiazide (ZESTORETIC) 20-12.5 MG tablet TAKE TWO TABLETS BY MOUTH DAILY 60 tablet 5   Multiple Vitamins-Minerals (MULTIVITAMIN WITH MINERALS) tablet Take 1 tablet by mouth daily.     Nebulizers (COMPRESSOR/NEBULIZER) MISC Use as directed 1 each 0   Fluticasone-Umeclidin-Vilant (TRELEGY ELLIPTA) 100-62.5-25 MCG/INH AEPB Inhale 1 puff into the lungs daily. (Patient not taking: Reported on 03/25/2021) 28 each 0   No facility-administered medications prior to visit.     Per HPI unless specifically indicated in ROS section below Review of Systems  Constitutional:  Negative for fatigue and fever.  HENT:  Negative for ear pain.   Eyes:  Negative for pain.  Respiratory:  Negative for cough and shortness of breath.   Cardiovascular:  Negative for chest pain, palpitations and leg swelling.  Gastrointestinal:  Negative for abdominal pain.  Genitourinary:  Negative for dysuria.  Musculoskeletal:  Negative for arthralgias.  Neurological:  Negative for syncope, light-headedness and headaches.  Psychiatric/Behavioral:  Negative for dysphoric mood.   Objective:  BP 130/80   Pulse 62   Temp 98 F (36.7 C) (Temporal)   Ht 5\' 10"  (1.778 m)   Wt 289 lb 2 oz (131.1 kg)   SpO2 98%   BMI 41.49 kg/m   Wt Readings from Last 3 Encounters:  03/25/21 289 lb 2 oz (131.1 kg)  12/20/20 296 lb 8 oz (134.5 kg)  11/11/20 293 lb (132.9 kg)      Physical Exam Constitutional:      Appearance: He is well-developed. He is obese.  HENT:     Head: Normocephalic.     Right  Ear: Hearing normal.     Left Ear: Hearing normal.     Nose: Nose normal.  Neck:     Thyroid: No thyroid mass or thyromegaly.     Vascular: No carotid bruit.     Trachea: Trachea normal.  Cardiovascular:     Rate and Rhythm: Normal rate and regular rhythm.     Pulses: Normal pulses.     Heart sounds: Heart sounds not distant. No murmur heard.   No friction rub. No gallop.     Comments: No peripheral edema Pulmonary:     Effort: Pulmonary effort is normal. No respiratory distress.     Breath sounds: Normal breath sounds.  Skin:    General: Skin is warm and dry.     Findings: No rash.  Psychiatric:        Speech: Speech normal.        Behavior: Behavior normal.        Thought Content: Thought content normal.      Results for orders placed or performed in visit on 12/20/20  POCT glycosylated hemoglobin (Hb A1C)  Result Value Ref Range   Hemoglobin A1C 6.0 (A) 4.0 - 5.6 %   HbA1c POC (<> result, manual entry)     HbA1c, POC (prediabetic range)     HbA1c, POC (controlled diabetic range)      This visit occurred during the SARS-CoV-2 public health emergency.  Safety protocols were in place, including screening questions prior to the visit, additional usage of staff PPE, and extensive cleaning of exam room while observing appropriate contact time as indicated for disinfecting solutions.   COVID 19 screen:  No recent travel or known exposure to COVID19 The patient denies respiratory symptoms of COVID 19 at this time. The importance of social distancing was discussed today.   Assessment and Plan    Problem List Items Addressed This Visit     Hypertension associated with diabetes ([place]) (Chronic)    BP  at goal on 2 tablets daily of lisinopril HCTZ 20/12.5 mg daily       Morbid obesity ([facility]) (Chronic)    Improving on 3 mg Trulicity and with daily exercise.  Continue current dose and increase exercise and diet change.      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

## 2021-04-05 ENCOUNTER — Other Ambulatory Visit: Payer: Self-pay | Admitting: Family Medicine

## 2021-04-10 ENCOUNTER — Telehealth: Payer: Self-pay | Admitting: Internal Medicine

## 2021-04-10 ENCOUNTER — Other Ambulatory Visit: Payer: Self-pay | Admitting: Internal Medicine

## 2021-04-10 MED ORDER — ALBUTEROL SULFATE HFA 108 (90 BASE) MCG/ACT IN AERS: 1.0000 | INHALATION_SPRAY | Freq: Four times a day (QID) | RESPIRATORY_TRACT | 3 refills | Status: DC | PRN

## 2021-04-10 MED ORDER — TRELEGY ELLIPTA 100-62.5-25 MCG/ACT IN AEPB: 1.0000 | INHALATION_SPRAY | Freq: Every day | RESPIRATORY_TRACT | 5 refills | Status: DC

## 2021-04-10 NOTE — Telephone Encounter (Signed)
Rx for pt's albuterol inhaler has been sent to preferred pharmacy. Called and spoke with pt letting him know that this had been done and he verbalized understanding. While speaking with pt, pt asked to have Rx of Trelegy sent to pharmacy as well to do a price check on that vs the Advair. Rx has been sent in for pt. Nothing further needed.

## 2021-06-26 ENCOUNTER — Ambulatory Visit (INDEPENDENT_AMBULATORY_CARE_PROVIDER_SITE_OTHER): Payer: Medicare PPO | Admitting: Family Medicine

## 2021-06-26 ENCOUNTER — Other Ambulatory Visit: Payer: Self-pay

## 2021-06-26 VITALS — BP 128/70 | HR 64 | Temp 98.0°F | Resp 16 | Ht 70.0 in | Wt 294.0 lb

## 2021-06-26 DIAGNOSIS — N529 Male erectile dysfunction, unspecified: Secondary | ICD-10-CM

## 2021-06-26 DIAGNOSIS — E1159 Type 2 diabetes mellitus with other circulatory complications: Secondary | ICD-10-CM | POA: Diagnosis not present

## 2021-06-26 DIAGNOSIS — I152 Hypertension secondary to endocrine disorders: Secondary | ICD-10-CM | POA: Diagnosis not present

## 2021-06-26 LAB — POCT GLYCOSYLATED HEMOGLOBIN (HGB A1C): Hemoglobin A1C: 6 % — AB (ref 4.0–5.6)

## 2021-06-26 MED ORDER — SILDENAFIL CITRATE 100 MG PO TABS: 100.0000 mg | ORAL_TABLET | Freq: Every day | ORAL | 0 refills | Status: DC | PRN

## 2021-06-26 NOTE — Assessment & Plan Note (Signed)
Stable, chronic.  Continue current medication. ? ? ?trulicity 3 mg weekly. ?

## 2021-06-26 NOTE — Patient Instructions (Addendum)
Get back on track with exercise and continue Trulicity 3 mg weekly. ?Set up yearly eye exam for diabetes and have the opthalmologist send [place] a copy of the evaluation for the chart. ? Trial of Viagra for ED. ? ?

## 2021-06-26 NOTE — Assessment & Plan Note (Signed)
Stable, chronic.  Continue current medication. ? ? ?On lisinopril HCTZ 20/12.5 2 tabs daily ?

## 2021-06-26 NOTE — Progress Notes (Signed)
? ? Patient ID: Carlos Day, male    DOB: [birth year], 66 y.o.   MRN: [id number] ? ?This visit was conducted in person. ? ?BP 128/70   Pulse 64   Temp 98 ?F (36.7 ?C)   Resp 16   Ht 5\' 10"  (1.778 m)   Wt 294 lb (133.4 kg)   SpO2 97%   BMI 42.18 kg/m?   ? ?CC:  ?Chief Complaint  ?Patient presents with  ? Diabetes  ? Weight Management  ? ? ? ?Subjective:  ? ?HPI: ?Carlos Day is a 66 y.o. male presenting on 06/26/2021 for Diabetes and Weight Management ? Red eyes due to allergies. ?Itchy and watery off and on. ? ?Diabetes:  A1C 6.0 today ? No SE to Trulicity weekly... He was out of Tulicity for 2-3  weeks given shortage. ? Has not been able to walk as much given weather. ? When he takes the Truliciy... he noted better portion size. ?Using medications without difficulties: none ?Hypoglycemic episodes: ?Hyperglycemic episodes: ?Feet problems: no ulcer ?Blood Sugars averaging: ?eye exam within last year: plan today. ? ? On Trulicity 3 mg weekly. ?   ?Wt Readings from Last 3 Encounters:  ?06/26/21 294 lb (133.4 kg)  ?03/25/21 289 lb 2 oz (131.1 kg)  ?12/20/20 296 lb 8 oz (134.5 kg)  ? ?BP Readings from Last 3 Encounters:  ?06/26/21 128/70  ?03/25/21 130/80  ?12/20/20 126/80  ? ? ? SE to Trelegy.. now back ADVAIR .Marland Kitchen working well for him. ? ?Has some issues with ED... no desire and no energy issues. ? ? ? ?Relevant past medical, surgical, family and social history reviewed and updated as indicated. Interim medical history since our last visit reviewed. ?Allergies and medications reviewed and updated. ?Outpatient Medications Prior to Visit  ?Medication Sig Dispense Refill  ? albuterol (VENTOLIN HFA) 108 (90 Base) MCG/ACT inhaler Inhale 1-2 puffs into the lungs every 6 (six) hours as needed for wheezing or shortness of breath. 18 g 3  ? APPLE CIDER VINEGAR PO Take 1 tablet by mouth 2 (two) times daily.    ? aspirin 81 MG tablet Take 81 mg by mouth daily.    ? atorvastatin (LIPITOR) 10 MG tablet TAKE ONE TABLET BY MOUTH  DAILY 90 tablet 1  ? Dulaglutide (TRULICITY) 3 HE/5.2DP SOPN Inject 3 mg as directed once a week. 3 mL 11  ? fluticasone-salmeterol (ADVAIR) 100-50 MCG/ACT AEPB Inhale 1 puff into the lungs 2 (two) times daily. 60 each 11  ? Insulin Pen Needle (PEN NEEDLES) 30G X 8 MM MISC Inject 0.75 mg into the skin once a week. For trulicity weekly 824 each 11  ? ipratropium-albuterol (DUONEB) 0.5-2.5 (3) MG/3ML SOLN Take 3 mLs by nebulization every 6 (six) hours as needed. 175 mL 0  ? lisinopril-hydrochlorothiazide (ZESTORETIC) 20-12.5 MG tablet TAKE TWO TABLETS BY MOUTH DAILY 60 tablet 5  ? Multiple Vitamins-Minerals (MULTIVITAMIN WITH MINERALS) tablet Take 1 tablet by mouth daily.    ? Nebulizers (COMPRESSOR/NEBULIZER) MISC Use as directed 1 each 0  ? Fluticasone-Umeclidin-Vilant (TRELEGY ELLIPTA) 100-62.5-25 MCG/ACT AEPB Inhale 1 puff into the lungs daily. 60 each 5  ? ?No facility-administered medications prior to visit.  ?  ? ?Per HPI unless specifically indicated in ROS section below ?Review of Systems  ?Constitutional:  Negative for fatigue and fever.  ?HENT:  Negative for ear pain.   ?Eyes:  Negative for pain.  ?Respiratory:  Negative for cough and shortness of breath.   ?Cardiovascular:  Positive for chest pain.  Negative for palpitations and leg swelling.  ?     Ache in left chest wall... worse at night and thinks from caffeine, no exertional pain ?Ashtma improved.  ?Gastrointestinal:  Negative for abdominal pain.  ?Genitourinary:  Negative for dysuria.  ?Musculoskeletal:  Negative for arthralgias.  ?Neurological:  Negative for syncope, light-headedness and headaches.  ?Psychiatric/Behavioral:  Negative for dysphoric mood.   ?Objective:  ?BP 128/70   Pulse 64   Temp 98 ?F (36.7 ?C)   Resp 16   Ht 5\' 10"  (1.778 m)   Wt 294 lb (133.4 kg)   SpO2 97%   BMI 42.18 kg/m?   ?Wt Readings from Last 3 Encounters:  ?06/26/21 294 lb (133.4 kg)  ?03/25/21 289 lb 2 oz (131.1 kg)  ?12/20/20 296 lb 8 oz (134.5 kg)  ?  ?   ?Physical Exam ?Constitutional:   ?   Appearance: He is well-developed. He is obese.  ?HENT:  ?   Head: Normocephalic.  ?   Right Ear: Hearing normal.  ?   Left Ear: Hearing normal.  ?   Nose: Nose normal.  ?Neck:  ?   Thyroid: No thyroid mass or thyromegaly.  ?   Vascular: No carotid bruit.  ?   Trachea: Trachea normal.  ?Cardiovascular:  ?   Rate and Rhythm: Normal rate and regular rhythm.  ?   Pulses: Normal pulses.  ?   Heart sounds: Heart sounds not distant. No murmur heard. ?  No friction rub. No gallop.  ?   Comments: No peripheral edema ?Pulmonary:  ?   Effort: Pulmonary effort is normal. No respiratory distress.  ?   Breath sounds: Normal breath sounds.  ?Skin: ?   General: Skin is warm and dry.  ?   Findings: No rash.  ?Psychiatric:     ?   Speech: Speech normal.     ?   Behavior: Behavior normal.     ?   Thought Content: Thought content normal.  ? ?   ?Results for orders placed or performed in visit on 03/25/21  ?POCT glycosylated hemoglobin (Hb A1C)  ?Result Value Ref Range  ? Hemoglobin A1C 6.0 (A) 4.0 - 5.6 %  ? HbA1c POC (<> result, manual entry)    ? HbA1c, POC (prediabetic range)    ? HbA1c, POC (controlled diabetic range)    ? ? ?This visit occurred during the SARS-CoV-2 public health emergency.  Safety protocols were in place, including screening questions prior to the visit, additional usage of staff PPE, and extensive cleaning of exam room while observing appropriate contact time as indicated for disinfecting solutions.  ? ?COVID 19 screen:  No recent travel or known exposure to [place] ?The patient denies respiratory symptoms of COVID 19 at this time. ?The importance of social distancing was discussed today.  ? ?Assessment and Plan ? ?  ?Problem List Items Addressed This Visit   ? ? Hypertension associated with diabetes ([place]) (Chronic)  ?  Stable, chronic.  Continue current medication. ? ? ?On lisinopril HCTZ 20/12.5 2 tabs daily ?  ?  ? Relevant Medications  ? sildenafil (VIAGRA) 100 MG  tablet  ? Morbid obesity ([place]) (Chronic)  ?  Stable, chronic.  Continue current medication. ? ? ?Continue trulicity 3 mg.. considers 4 mg if weight loss not starting back once  Consistent use of GLP1 ?  ?  ? Controlled type 2 diabetes mellitus with circulatory disorder ( HTN, ED) ([place]) - Primary  ?  Stable, chronic.  Continue current medication. ? ? ?trulicity 3 mg weekly. ?  ?  ? Relevant Medications  ? sildenafil (VIAGRA) 100 MG tablet  ? Other Relevant Orders  ? POCT HgB A1C  ? Erectile dysfunction  ?  Trial of sidenafil, lowest effective dose. ? NO S/s of testosterone issues. ?  ?  ? ?Meds ordered this encounter  ?Medications  ? sildenafil (VIAGRA) 100 MG tablet  ?  Sig: Take 1 tablet (100 mg total) by mouth daily as needed for erectile dysfunction.  ?  Dispense:  10 tablet  ?  Refill:  0  ? ?Orders Placed This Encounter  ?Procedures  ? POCT HgB A1C  ? ? ? ?Eliezer Lofts, MD  ? ?

## 2021-06-26 NOTE — Assessment & Plan Note (Signed)
Trial of sidenafil, lowest effective dose. ? NO S/s of testosterone issues. ?

## 2021-06-26 NOTE — Assessment & Plan Note (Signed)
Stable, chronic.  Continue current medication. ? ? ?Continue trulicity 3 mg.. considers 4 mg if weight loss not starting back once  Consistent use of GLP1 ?

## 2021-06-28 ENCOUNTER — Other Ambulatory Visit: Payer: Self-pay | Admitting: Internal Medicine

## 2021-07-16 LAB — HM DIABETES EYE EXAM

## 2021-07-17 ENCOUNTER — Telehealth: Payer: Self-pay | Admitting: Family Medicine

## 2021-07-17 NOTE — Telephone Encounter (Signed)
Pt dropped off paperwork ? ?Type of forms received:[facility] ? ?Routed ER:[id number] ? ?Paperwork received by : terrill ? ? ?Individual made aware of 3-5 business day turn around (Y/N): ?y ?Form completed and patient made aware of charges(Y/N): ?y ? ?Faxed to :  ? ?Form location: dr Diona Browner box ? ?

## 2021-07-17 NOTE — Telephone Encounter (Addendum)
Carlos Day dropped off eye exam report from [facility].  I do not see that they did a diabetic eye exam.  Will place in Dr. Rometta Emery office for review.    ?

## 2021-07-18 NOTE — Telephone Encounter (Signed)
Reviewed.  Let the patient know that the form he provided from [place] group did not show a retinal exam.  It does not appear that they did a diabetic retinal exam.  He may need to go to a different location with an ophthalmologist to get this done  ?Unfortunately it does not look like he is a candidate for the [facility] retinal clinic ?

## 2021-07-21 NOTE — Telephone Encounter (Signed)
Left message for Carlos Day to return call in regards to his eye exam report he dropped off last week.  ?

## 2021-07-22 NOTE — Telephone Encounter (Addendum)
Spoke with Mr. Manganaro and advised that a diabetic eye exam was not done at his recent eye appointment.  I ask that he call them back to let them know he needs to have this done and see if they will bring him back into the office to complete a diabetic eye exam and send [place] that office note.  Patient states understanding and will call [facility]. ?

## 2021-10-08 ENCOUNTER — Other Ambulatory Visit: Payer: Self-pay | Admitting: Family Medicine

## 2021-10-19 ENCOUNTER — Other Ambulatory Visit: Payer: Self-pay | Admitting: Family Medicine

## 2021-10-22 ENCOUNTER — Other Ambulatory Visit: Payer: Self-pay | Admitting: Family Medicine

## 2021-12-25 ENCOUNTER — Other Ambulatory Visit: Payer: BC Managed Care – PPO

## 2021-12-29 ENCOUNTER — Telehealth: Payer: Self-pay | Admitting: Family Medicine

## 2021-12-29 DIAGNOSIS — E1159 Type 2 diabetes mellitus with other circulatory complications: Secondary | ICD-10-CM

## 2021-12-29 DIAGNOSIS — Z125 Encounter for screening for malignant neoplasm of prostate: Secondary | ICD-10-CM

## 2021-12-29 LAB — HM DIABETES FOOT EXAM

## 2021-12-29 NOTE — Telephone Encounter (Signed)
-----   Message from Ellamae Sia sent at 12/23/2021  4:01 PM EDT ----- Regarding: Lab orders for Tuesday  9.12.23 Patient is scheduled for CPX labs, please order future labs, Thanks , Karna Christmas

## 2021-12-30 ENCOUNTER — Ambulatory Visit: Payer: Medicare PPO

## 2022-01-01 ENCOUNTER — Encounter: Payer: BC Managed Care – PPO | Admitting: Family Medicine

## 2022-01-03 ENCOUNTER — Other Ambulatory Visit: Payer: Self-pay | Admitting: Family Medicine

## 2022-01-06 ENCOUNTER — Other Ambulatory Visit (INDEPENDENT_AMBULATORY_CARE_PROVIDER_SITE_OTHER): Payer: Medicare PPO

## 2022-01-06 DIAGNOSIS — E1159 Type 2 diabetes mellitus with other circulatory complications: Secondary | ICD-10-CM | POA: Diagnosis not present

## 2022-01-06 DIAGNOSIS — Z125 Encounter for screening for malignant neoplasm of prostate: Secondary | ICD-10-CM

## 2022-01-06 LAB — LIPID PANEL
Cholesterol: 140 mg/dL (ref 0–200)
HDL: 39.5 mg/dL (ref >39.00)
LDL Cholesterol: 80 mg/dL (ref 0–99)
NonHDL: 100.99
Total CHOL/HDL Ratio: 4
Triglycerides: 105 mg/dL (ref 0.0–149.0)
VLDL: 21 mg/dL (ref 0.0–40.0)

## 2022-01-06 LAB — COMPREHENSIVE METABOLIC PANEL
ALT: 33 U/L (ref 0–53)
AST: 22 U/L (ref 0–37)
Albumin: 4 g/dL (ref 3.5–5.2)
Alkaline Phosphatase: 53 U/L (ref 39–117)
BUN: 13 mg/dL (ref 6–23)
CO2: 30 mEq/L (ref 19–32)
Calcium: 9.9 mg/dL (ref 8.4–10.5)
Chloride: 100 mEq/L (ref 96–112)
Creatinine, Ser: 0.96 mg/dL (ref 0.40–1.50)
GFR: 82.97 mL/min (ref >60.00)
Glucose, Bld: 100 mg/dL — ABNORMAL HIGH (ref 70–99)
Potassium: 4 mEq/L (ref 3.5–5.1)
Sodium: 138 mEq/L (ref 135–145)
Total Bilirubin: 0.6 mg/dL (ref 0.2–1.2)
Total Protein: 6.7 g/dL (ref 6.0–8.3)

## 2022-01-06 LAB — PSA: PSA: 0.62 ng/mL (ref 0.10–4.00)

## 2022-01-06 LAB — HEMOGLOBIN A1C: Hgb A1c MFr Bld: 6.8 % — ABNORMAL HIGH (ref 4.6–6.5)

## 2022-01-06 NOTE — Progress Notes (Signed)
No critical labs need to be addressed urgently. We will discuss labs in detail at upcoming office visit.   

## 2022-01-13 ENCOUNTER — Telehealth: Payer: Self-pay | Admitting: *Deleted

## 2022-01-13 ENCOUNTER — Ambulatory Visit (INDEPENDENT_AMBULATORY_CARE_PROVIDER_SITE_OTHER): Payer: Medicare PPO | Admitting: Family Medicine

## 2022-01-13 VITALS — BP 122/70 | HR 55 | Temp 97.4°F | Ht 70.0 in | Wt 297.2 lb

## 2022-01-13 DIAGNOSIS — E785 Hyperlipidemia, unspecified: Secondary | ICD-10-CM

## 2022-01-13 DIAGNOSIS — J4540 Moderate persistent asthma, uncomplicated: Secondary | ICD-10-CM | POA: Diagnosis not present

## 2022-01-13 DIAGNOSIS — Z23 Encounter for immunization: Secondary | ICD-10-CM | POA: Diagnosis not present

## 2022-01-13 DIAGNOSIS — Z0000 Encounter for general adult medical examination without abnormal findings: Secondary | ICD-10-CM

## 2022-01-13 DIAGNOSIS — I152 Hypertension secondary to endocrine disorders: Secondary | ICD-10-CM

## 2022-01-13 DIAGNOSIS — E1159 Type 2 diabetes mellitus with other circulatory complications: Secondary | ICD-10-CM

## 2022-01-13 DIAGNOSIS — Z0001 Encounter for general adult medical examination with abnormal findings: Secondary | ICD-10-CM

## 2022-01-13 DIAGNOSIS — G4733 Obstructive sleep apnea (adult) (pediatric): Secondary | ICD-10-CM

## 2022-01-13 DIAGNOSIS — E1169 Type 2 diabetes mellitus with other specified complication: Secondary | ICD-10-CM

## 2022-01-13 MED ORDER — OZEMPIC (0.25 OR 0.5 MG/DOSE) 2 MG/3ML ~~LOC~~ SOPN: PEN_INJECTOR | SUBCUTANEOUS | 0 refills | Status: DC

## 2022-01-13 NOTE — Assessment & Plan Note (Addendum)
ON CPAP Needs weight loss.

## 2022-01-13 NOTE — Patient Instructions (Addendum)
Stop Trulicity as not effective and change to Ozempic 0.25 mg weekly.  Call with an update in 2-3 week for med dose increase.  Call if not able to find or afford... we can consider instead increasing Trulicity to 4.5 mg weekly.  Consider Shingrix vaccine series at pharmacy.

## 2022-01-13 NOTE — Progress Notes (Signed)
Patient ID: Carlos Day, male    DOB: Sep 03, 1955, 66 y.o.   MRN: [id number]  This visit was conducted in person.  BP 122/70 (BP Location: Left Arm, Patient Position: Sitting, Cuff Size: Large)   Pulse (!) 55   Temp (!) 97.4 F (36.3 C) (Temporal)   Ht '5\' 10"'$  (1.778 m)   Wt 297 lb 4 oz (134.8 kg)   SpO2 96%   BMI 42.65 kg/m    CC:  Chief Complaint  Patient presents with   Medicare Wellness    Welcome to Medicare    Subjective:   HPI: Carlos Day is a 66 y.o. male presenting on 01/13/2022 for Medicare Wellness (Welcome to [employer])  The patient presents for annual medicare wellness, complete physical and review of chronic health problems. He/She also has the following acute concerns today: I have personally reviewed the Medicare Annual Wellness questionnaire and have noted 1. The patient's medical and social history 2. Their use of alcohol, tobacco or illicit drugs 3. Their current medications and supplements 4. The patient's functional ability including ADL's, fall risks, home safety risks and hearing or visual             impairment. 5. Diet and physical activities 6. Evidence for depression or mood disorders 7.         Updated provider list Cognitive evaluation was performed and recorded on pt medicare questionnaire form. The patients weight, height, BMI and visual acuity have been recorded in the chart   I have made referrals, counseling and provided education to the patient based review of the above and I have provided the pt with a written personalized care plan for preventive services.    Documentation of this information was scanned into the electronic record under the media tab.   No falls in last 12 months.  Hearing Screening   '500Hz'$  '1000Hz'$  '2000Hz'$  '4000Hz'$   Right ear 40 40 0 0  Left ear 0 40 40 0   Vision Screening   Right eye Left eye Both eyes  Without correction '20/40 20/50 20/30 '$  With correction      Flowsheet Row Office Visit from 01/13/2022 in  [place] [facility] Total Score 0      Advance directives and end of life planning reviewed in detail with patient and documented in EMR. Patient given handout on advance care directives if needed. HCPOA and living will updated if needed.  Diabetes: Well-controlled on Trulicity 3 mg p.o. weekly... not able to take all the time given availability or cost. Lab Results  Component Value Date   HGBA1C 6.8 (H) 01/06/2022  Using medications without difficulties: Hypoglycemic episodes: Hyperglycemic episodes: Feet problems: no ulcer Blood Sugars averaging:  not checking eye exam within last year:  Wt Readings from Last 3 Encounters:  01/13/22 297 lb 4 oz (134.8 kg)  06/26/21 294 lb (133.4 kg)  03/25/21 289 lb 2 oz (131.1 kg)  Body mass index is 42.65 kg/m.  Hypertension:  Well controlled  On lisinopril hydrochlorothiazide 20/12.5 mg 2 tablets p.o. daily BP Readings from Last 3 Encounters:  01/13/22 122/70  06/26/21 128/70  03/25/21 130/80  Using medication without problems or lightheadedness:  none Chest pain with exertion:none Edema: none Short of breath: none Average home BPs: Other issues:   Elevated Cholesterol: LDL at goal less than 100 on atorvastatin 10 mg daily Lab Results  Component Value Date   CHOL 140 01/06/2022   HDL 39.50 01/06/2022   LDLCALC 80  01/06/2022   LDLDIRECT 125.0 11/17/2016   TRIG 105.0 01/06/2022   CHOLHDL 4 01/06/2022  Using medications without problems: Muscle aches:  Diet compliance: moderate Exercise: trying to increase Other complaints:   Asthma moderate persistent: Ran out of Advair... was more SOB, but feeling better back on it.   Has albuterol prn.   He has noted some issues getting urine out to empty.  2 urination at night.     Relevant past medical, surgical, family and social history reviewed and updated as indicated. Interim medical history since our last visit reviewed. Allergies and medications reviewed  and updated. Outpatient Medications Prior to Visit  Medication Sig Dispense Refill   ADVAIR DISKUS 100-50 MCG/ACT AEPB INHALE ONE PUFF BY MOUTH TWICE A DAY 60 each 2   albuterol (PROVENTIL) (2.5 MG/3ML) 0.083% nebulizer solution USE THREE MILLILITERS VIA NEBULIZATION BY MOUTH EVERY 6 HOURS AS NEEDED FOR WHEEZING OR FOR SHORTNESS OF BREATH 75 mL 6   albuterol (VENTOLIN HFA) 108 (90 Base) MCG/ACT inhaler Inhale 1-2 puffs into the lungs every 6 (six) hours as needed for wheezing or shortness of breath. 18 g 3   APPLE CIDER VINEGAR PO Take 1 tablet by mouth 2 (two) times daily.     aspirin 81 MG tablet Take 81 mg by mouth daily.     atorvastatin (LIPITOR) 10 MG tablet TAKE ONE TABLET BY MOUTH DAILY 90 tablet 0   Insulin Pen Needle (PEN NEEDLES) 30G X 8 MM MISC Inject 0.75 mg into the skin once a week. For trulicity weekly 553 each 11   ipratropium-albuterol (DUONEB) 0.5-2.5 (3) MG/3ML SOLN Take 3 mLs by nebulization every 6 (six) hours as needed. 175 mL 0   lisinopril-hydrochlorothiazide (ZESTORETIC) 20-12.5 MG tablet TAKE TWO TABLETS BY MOUTH DAILY 60 tablet 5   Multiple Vitamins-Minerals (MULTIVITAMIN WITH MINERALS) tablet Take 1 tablet by mouth daily.     Nebulizers (COMPRESSOR/NEBULIZER) MISC Use as directed 1 each 0   sildenafil (VIAGRA) 100 MG tablet Take 1 tablet (100 mg total) by mouth daily as needed for erectile dysfunction. 10 tablet 0   TRULICITY 3 ZS/8.2LM SOPN INJECT 3 MG UNDER THE SKIN ONCE WEEKLY 2 mL 0   No facility-administered medications prior to visit.     Per HPI unless specifically indicated in ROS section below Review of Systems  Constitutional:  Negative for fatigue and fever.  HENT:  Negative for ear pain.   Eyes:  Negative for pain.  Respiratory:  Negative for cough and shortness of breath.   Cardiovascular:  Negative for chest pain, palpitations and leg swelling.  Gastrointestinal:  Negative for abdominal pain.  Genitourinary:  Positive for frequency. Negative for  dysuria.  Musculoskeletal:  Negative for arthralgias.  Neurological:  Negative for syncope, light-headedness and headaches.  Psychiatric/Behavioral:  Negative for dysphoric mood.    Objective:  BP 122/70 (BP Location: Left Arm, Patient Position: Sitting, Cuff Size: Large)   Pulse (!) 55   Temp (!) 97.4 F (36.3 C) (Temporal)   Ht '5\' 10"'$  (1.778 m)   Wt 297 lb 4 oz (134.8 kg)   SpO2 96%   BMI 42.65 kg/m   Wt Readings from Last 3 Encounters:  01/13/22 297 lb 4 oz (134.8 kg)  06/26/21 294 lb (133.4 kg)  03/25/21 289 lb 2 oz (131.1 kg)      Physical Exam Constitutional:      Appearance: He is well-developed.  HENT:     Head: Normocephalic.     Right Ear: Hearing  normal.     Left Ear: Hearing normal.     Nose: Nose normal.  Neck:     Thyroid: No thyroid mass or thyromegaly.     Vascular: No carotid bruit.     Trachea: Trachea normal.  Cardiovascular:     Rate and Rhythm: Normal rate and regular rhythm.     Pulses: Normal pulses.     Heart sounds: Heart sounds not distant. No murmur heard.    No friction rub. No gallop.     Comments: No peripheral edema Pulmonary:     Effort: Pulmonary effort is normal. No respiratory distress.     Breath sounds: Normal breath sounds.  Skin:    General: Skin is warm and dry.     Findings: No rash.  Psychiatric:        Speech: Speech normal.        Behavior: Behavior normal.        Thought Content: Thought content normal.     Diabetic foot exam: Normal inspection No skin breakdown No calluses  Normal DP pulses Normal sensation to light touch and monofilament Nails normal     Results for orders placed or performed in visit on 01/06/22  PSA  Result Value Ref Range   PSA 0.62 0.10 - 4.00 ng/mL  Comprehensive metabolic panel  Result Value Ref Range   Sodium 138 135 - 145 mEq/L   Potassium 4.0 3.5 - 5.1 mEq/L   Chloride 100 96 - 112 mEq/L   CO2 30 19 - 32 mEq/L   Glucose, Bld 100 (H) 70 - 99 mg/dL   BUN 13 6 - 23 mg/dL    Creatinine, Ser 0.96 0.40 - 1.50 mg/dL   Total Bilirubin 0.6 0.2 - 1.2 mg/dL   Alkaline Phosphatase 53 39 - 117 U/L   AST 22 0 - 37 U/L   ALT 33 0 - 53 U/L   Total Protein 6.7 6.0 - 8.3 g/dL   Albumin 4.0 3.5 - 5.2 g/dL   GFR 82.97 >60.00 mL/min   Calcium 9.9 8.4 - 10.5 mg/dL  Lipid panel  Result Value Ref Range   Cholesterol 140 0 - 200 mg/dL   Triglycerides 105.0 0.0 - 149.0 mg/dL   HDL 39.50 >39.00 mg/dL   VLDL 21.0 0.0 - 40.0 mg/dL   LDL Cholesterol 80 0 - 99 mg/dL   Total CHOL/HDL Ratio 4    NonHDL 100.99   Hemoglobin A1c  Result Value Ref Range   Hgb A1c MFr Bld 6.8 (H) 4.6 - 6.5 %     COVID 19 screen:  No recent travel or known exposure to COVID19 The patient denies respiratory symptoms of COVID 19 at this time. The importance of social distancing was discussed today.   Assessment and Plan   The patient's preventative maintenance and recommended screening tests for an annual wellness exam were reviewed in full today. Brought up to date unless services declined.  Counselled on the importance of diet, exercise, and its role in overall health and mortality. The patient's FH and SH was reviewed, including their home life, tobacco status, and drug and alcohol status.   Due for eye exam.  Refused flu vaccines given  intolerant, uptodate with td  Given prevnar 20 and    Consider shingrix No family history of prostate cancer Lab Results  Component Value Date   PSA 0.62 01/06/2022   PSA 0.48 03/12/2020   PSA 0.47 05/03/2019  Colon [place]:  no family colon cancer. Last  colon in 2014, sessile polyp Dr. Eugenia Pancoast, repeat due 2024.  Hep C: done  HIV : done  nonsmoker Problem List Items Addressed This Visit     Asthma, moderate persistent (Chronic)     Chronic, improved control back on Advair 100/50 twice daily.  He has upcoming follow-up with pulmonology.  Of note he is wheezing in office today.  Offered increase in dose of Advair versus prednisone taper but he states he  just restarted it 3 days ago and has noted significant improvement.  No signs and symptoms of allergies or infection.      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 trouble getting  Trulicity.  Will change to ozempic to see if more affordable for him and more available.  Start at 0.25 mg weekly, plan to titrate up  .Encouraged exercise, weight loss, healthy eating habits.       Relevant Medications   Semaglutide,0.25 or 0.'5MG'$ /DOS, (OZEMPIC, 0.25 OR 0.5 MG/DOSE,) 2 MG/3ML SOPN   Hyperlipidemia associated with type 2 diabetes mellitus (HCC) (Chronic)    Stable, chronic.  Continue current medication.   Atorvastatin 10 mg p.o. daily      Relevant Medications   Semaglutide,0.25 or 0.'5MG'$ /DOS, (OZEMPIC, 0.25 OR 0.5 MG/DOSE,) 2 MG/3ML SOPN   Hypertension associated with diabetes ([place]) (Chronic)    Well controlled   On lisinopril hydrochlorothiazide 20/12.5 mg 2 tablets p.o. daily      Relevant Medications   Semaglutide,0.25 or 0.'5MG'$ /DOS, (OZEMPIC, 0.25 OR 0.5 MG/DOSE,) 2 MG/3ML SOPN   Morbid obesity (HCC) (Chronic)    Encouraged exercise, weight loss, healthy eating habits.       Relevant Medications   Semaglutide,0.25 or 0.'5MG'$ /DOS, (OZEMPIC, 0.25 OR 0.5 MG/DOSE,) 2 MG/3ML SOPN   Obstructive sleep apnea (Chronic)    ON CPAP Needs weight loss.      Other Visit Diagnoses     Welcome to Medicare preventive visit    -  Primary        Eliezer Lofts, MD

## 2022-01-13 NOTE — Assessment & Plan Note (Signed)
Chronic, improved control back on Advair 100/50 twice daily.  He has upcoming follow-up with pulmonology.  Of note he is wheezing in office today.  Offered increase in dose of Advair versus prednisone taper but he states he just restarted it 3 days ago and has noted significant improvement.  No signs and symptoms of allergies or infection.

## 2022-01-13 NOTE — Addendum Note (Signed)
Addended by: Tammi Sou on: 01/13/2022 04:17 PM   Modules accepted: Orders

## 2022-01-13 NOTE — Assessment & Plan Note (Signed)
Well controlled   On lisinopril hydrochlorothiazide 20/12.5 mg 2 tablets p.o. daily

## 2022-01-13 NOTE — Assessment & Plan Note (Signed)
Chronic, slight worsening of control... trouble getting  Trulicity.  Will change to ozempic to see if more affordable for him and more available.  Start at 0.25 mg weekly, plan to titrate up  .Encouraged exercise, weight loss, healthy eating habits.

## 2022-01-13 NOTE — Assessment & Plan Note (Signed)
Stable, chronic.  Continue current medication.  Atorvastatin 10 mg p.o. daily 

## 2022-01-13 NOTE — Assessment & Plan Note (Signed)
Encouraged exercise, weight loss, healthy eating habits. ? ?

## 2022-01-13 NOTE — Telephone Encounter (Signed)
Received fax from Kristopher Oppenheim requesting PA for Ozempic 2 mg/3 ml.  PA completed on CoverMyMeds and sent to [facility] for review.  Can take up to 72 hours for a decision

## 2022-01-14 NOTE — Telephone Encounter (Signed)
PA approved.  Good until 04/26/2022.  Kristopher Oppenheim notified of approval via fax.

## 2022-01-15 ENCOUNTER — Telehealth: Payer: Self-pay | Admitting: Family Medicine

## 2022-01-15 NOTE — Telephone Encounter (Signed)
I would be happy to put him back on Trulicity.  Is he willing to increase the dose to 4.'5mg'$  weekly?

## 2022-01-15 NOTE — Addendum Note (Signed)
Addended byEliezer Lofts E on: 01/15/2022 01:13 PM   Modules accepted: Orders

## 2022-01-15 NOTE — Telephone Encounter (Signed)
Patient called in and stated that the Semaglutide,0.25 or 0.'5MG'$ /DOS, (OZEMPIC, 0.25 OR 0.5 MG/DOSE,) 2 MG/3ML SOPN is much more expensive. He was wanting to go back to his old medication which was Trulicity. Thank you !

## 2022-01-15 NOTE — Telephone Encounter (Signed)
Spoke with Mr. Carlos Day.  He is agreeable to trying Trulicity 4.5 mg.  [place] in [place].

## 2022-01-15 NOTE — Telephone Encounter (Signed)
Left message for Carlos Day to return my call.

## 2022-01-16 MED ORDER — TRULICITY 4.5 MG/0.5ML ~~LOC~~ SOAJ: 4.5000 mg | SUBCUTANEOUS | 11 refills | Status: DC

## 2022-01-16 NOTE — Addendum Note (Signed)
Addended by: Eliezer Lofts E on: 01/16/2022 04:55 PM   Modules accepted: Orders

## 2022-02-09 ENCOUNTER — Other Ambulatory Visit: Payer: Self-pay | Admitting: Family Medicine

## 2022-02-10 ENCOUNTER — Other Ambulatory Visit: Payer: Self-pay | Admitting: Family Medicine

## 2022-03-26 ENCOUNTER — Telehealth: Payer: Self-pay | Admitting: Family Medicine

## 2022-03-26 NOTE — Telephone Encounter (Signed)
error 

## 2022-03-31 ENCOUNTER — Encounter: Payer: Self-pay | Admitting: Family Medicine

## 2022-03-31 ENCOUNTER — Ambulatory Visit (INDEPENDENT_AMBULATORY_CARE_PROVIDER_SITE_OTHER)
Admission: RE | Admit: 2022-03-31 | Discharge: 2022-03-31 | Disposition: A | Discharge status: Home / Self Care | Payer: Medicare PPO | Source: Ambulatory Visit | Attending: Family Medicine | Admitting: Family Medicine

## 2022-03-31 ENCOUNTER — Ambulatory Visit (INDEPENDENT_AMBULATORY_CARE_PROVIDER_SITE_OTHER): Payer: Medicare PPO | Admitting: Family Medicine

## 2022-03-31 VITALS — BP 118/84 | HR 79 | Temp 98.5°F | Ht 70.0 in | Wt 286.0 lb

## 2022-03-31 DIAGNOSIS — M25512 Pain in left shoulder: Secondary | ICD-10-CM | POA: Insufficient documentation

## 2022-03-31 DIAGNOSIS — W11XXXA Fall on and from ladder, initial encounter: Secondary | ICD-10-CM

## 2022-03-31 DIAGNOSIS — M79645 Pain in left finger(s): Secondary | ICD-10-CM | POA: Insufficient documentation

## 2022-03-31 NOTE — Progress Notes (Signed)
Patient ID: Carlos Day, male    DOB: Aug 26, 1955, 66 y.o.   MRN: [id number]  This visit was conducted in person.  BP 118/84   Pulse 79   Temp 98.5 F (36.9 C)   Ht '5\' 10"'$  (1.778 m)   Wt 286 lb (129.7 kg)   SpO2 95%   BMI 41.04 kg/m    CC:  Chief Complaint  Patient presents with   Fall    Pt fell off ladder about 3 weeks ago  injuried left shoulder and finger , sore , pt has movement in finger and shoulder, taking otc pain medicine     Subjective:   HPI: Carlos Day is a 66 y.o. male presenting on 03/31/2022 for Fall (Pt fell off ladder about 3 weeks ago  injuried left shoulder and finger , sore , pt has movement in finger and shoulder, taking otc pain medicine )   3 weeks ago, Fell off 12 foot ladder... cutting tree limb, it hit ladder and lost balance, fell and landed on outstretched left arm. Jammed middle finger and hit left shoulder.  Possible head injury... but no dizziness, no LOC.   Able to move arm, did not ice or heat.  No MD visit.  No swelling, no contusion.  Pain initially  improved but now ache with moving, pain posterior.  Pain with int, ext rotation and abduction.  2 days awoke with severe pain.  Using tylenol and advil.   No numbness or weakness.        Relevant past medical, surgical, family and social history reviewed and updated as indicated. Interim medical history since our last visit reviewed. Allergies and medications reviewed and updated. Outpatient Medications Prior to Visit  Medication Sig Dispense Refill   ADVAIR DISKUS 100-50 MCG/ACT AEPB INHALE ONE PUFF BY MOUTH TWICE A DAY 60 each 2   albuterol (PROVENTIL) (2.5 MG/3ML) 0.083% nebulizer solution USE THREE MILLILITERS VIA NEBULIZATION BY MOUTH EVERY 6 HOURS AS NEEDED FOR WHEEZING OR FOR SHORTNESS OF BREATH 75 mL 6   albuterol (VENTOLIN HFA) 108 (90 Base) MCG/ACT inhaler Inhale 1-2 puffs into the lungs every 6 (six) hours as needed for wheezing or shortness of breath. 18 g 3   APPLE  CIDER VINEGAR PO Take 1 tablet by mouth 2 (two) times daily.     aspirin 81 MG tablet Take 81 mg by mouth daily.     atorvastatin (LIPITOR) 10 MG tablet TAKE 1 TABLET BY MOUTH DAILY 90 tablet 3   Dulaglutide (TRULICITY) 4.5 RS/8.5IO SOPN Inject 4.5 mg as directed once a week. 2 mL 11   Insulin Pen Needle (PEN NEEDLES) 30G X 8 MM MISC Inject 0.75 mg into the skin once a week. For trulicity weekly 270 each 11   ipratropium-albuterol (DUONEB) 0.5-2.5 (3) MG/3ML SOLN Take 3 mLs by nebulization every 6 (six) hours as needed. 175 mL 0   lisinopril-hydrochlorothiazide (ZESTORETIC) 20-12.5 MG tablet TAKE TWO TABLETS BY MOUTH DAILY 60 tablet 5   Multiple Vitamins-Minerals (MULTIVITAMIN WITH MINERALS) tablet Take 1 tablet by mouth daily.     Nebulizers (COMPRESSOR/NEBULIZER) MISC Use as directed 1 each 0   sildenafil (VIAGRA) 100 MG tablet Take 1 tablet (100 mg total) by mouth daily as needed for erectile dysfunction. 10 tablet 0   No facility-administered medications prior to visit.     Per HPI unless specifically indicated in ROS section below Review of Systems  Constitutional:  Negative for fatigue and fever.  HENT:  Negative for ear  pain.   Eyes:  Negative for pain.  Respiratory:  Negative for cough and shortness of breath.   Cardiovascular:  Negative for chest pain, palpitations and leg swelling.  Gastrointestinal:  Negative for abdominal pain.  Genitourinary:  Negative for dysuria.  Musculoskeletal:  Positive for arthralgias.  Neurological:  Negative for syncope, light-headedness and headaches.  Psychiatric/Behavioral:  Negative for dysphoric mood.    Objective:  BP 118/84   Pulse 79   Temp 98.5 F (36.9 C)   Ht '5\' 10"'$  (1.778 m)   Wt 286 lb (129.7 kg)   SpO2 95%   BMI 41.04 kg/m   Wt Readings from Last 3 Encounters:  03/31/22 286 lb (129.7 kg)  01/13/22 297 lb 4 oz (134.8 kg)  06/26/21 294 lb (133.4 kg)      Physical Exam Constitutional:      Appearance: He is well-developed.   HENT:     Head: Normocephalic.     Right Ear: Hearing normal.     Left Ear: Hearing normal.     Nose: Nose normal.  Neck:     Thyroid: No thyroid mass or thyromegaly.     Vascular: No carotid bruit.     Trachea: Trachea normal.  Cardiovascular:     Rate and Rhythm: Normal rate and regular rhythm.     Pulses: Normal pulses.     Heart sounds: Heart sounds not distant. No murmur heard.    No friction rub. No gallop.     Comments: No peripheral edema Pulmonary:     Effort: Pulmonary effort is normal. No respiratory distress.     Breath sounds: Normal breath sounds.  Musculoskeletal:     Left shoulder: Tenderness present. No swelling, deformity, effusion or bony tenderness. Decreased range of motion.       Arms:     Comments: Pain and non-focal ttp middle finger, nml ROM.  Skin:    General: Skin is warm and dry.     Findings: No rash.  Psychiatric:        Speech: Speech normal.        Behavior: Behavior normal.        Thought Content: Thought content normal.       Results for orders placed or performed in visit on 01/13/22  HM DIABETES FOOT EXAM  Result Value Ref Range   HM Diabetic Foot Exam done      COVID 19 screen:  No recent travel or known exposure to [place] The patient denies respiratory symptoms of COVID 19 at this time. The importance of social distancing was discussed today.   Assessment and Plan    Problem List Items Addressed This Visit     Acute pain of left shoulder   Relevant Orders   DG Shoulder Left (Completed)   Pain of left middle finger   Other Visit Diagnoses     Fall from ladder, initial encounter    -  Primary   Relevant Orders   DG Shoulder Left (Completed)      Start home physical therapy.  Use ibuprofen 600 -800 mg three times daily, with  food.   We will call  X-ray results.  If not improving in 2-4 weeks.. call for further recommendations.  Carlos Lofts, MD

## 2022-03-31 NOTE — Patient Instructions (Signed)
Start home physical therapy.  Use ibuprofen 600 -800 mg three times daily, with  food.   We will call  X-ray results.  If not improving in 2-4 weeks.. call for further recommendations.

## 2022-04-06 MED ORDER — DICLOFENAC SODIUM 75 MG PO TBEC: 75.0000 mg | DELAYED_RELEASE_TABLET | Freq: Two times a day (BID) | ORAL | 0 refills | Status: AC

## 2022-04-14 ENCOUNTER — Ambulatory Visit: Payer: Medicare PPO | Admitting: Family Medicine

## 2022-05-06 ENCOUNTER — Telehealth: Payer: Self-pay

## 2022-05-06 NOTE — Telephone Encounter (Signed)
On trulicity.

## 2022-05-06 NOTE — Telephone Encounter (Signed)
Noted  

## 2022-05-06 NOTE — Telephone Encounter (Signed)
Pharmacy Patient Advocate Encounter   Received notification from [facility] that prior authorization for Ozempic '2mg'$ /60m is required/requested for renewal.    PA was not submitted due to the medication has been changed.   Key: [id number]

## 2022-05-08 IMAGING — CT CT HEAD W/O CM
4 series · 16 of 47 positions shown, 18 images · non-contrast
Comparison: CT brain 04/17/2005

CLINICAL DATA: Headache elevated blood pressure

EXAM:
CT HEAD WITHOUT CONTRAST
TECHNIQUE: Contiguous axial images were obtained from the base of the skull
through the vertex without intravenous contrast.

[Series 3: head wo · axial · 0.44mm/px · z∈[-68,+52]mm · 7 of 34 slices shown, 9 images]
[im 5/34  brain]
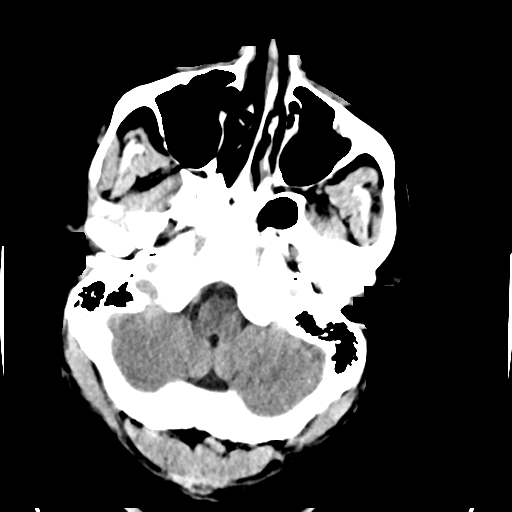
[im 5/34  bone]
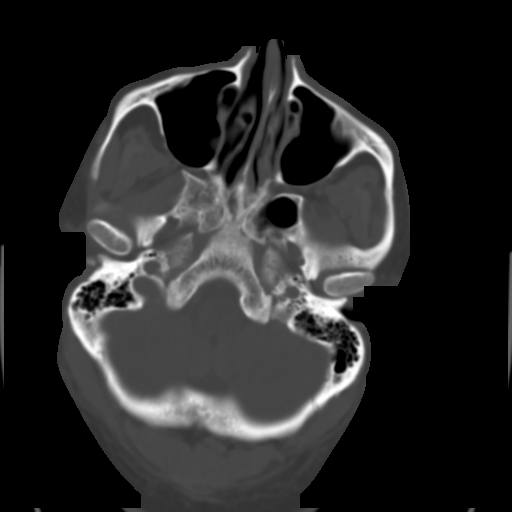
[im 9/34  brain]
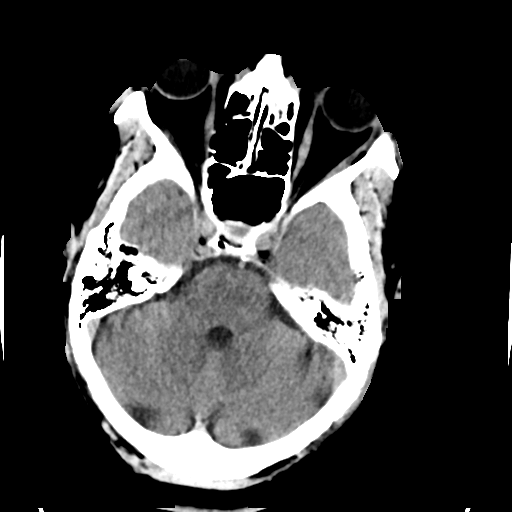
[im 13/34  brain]
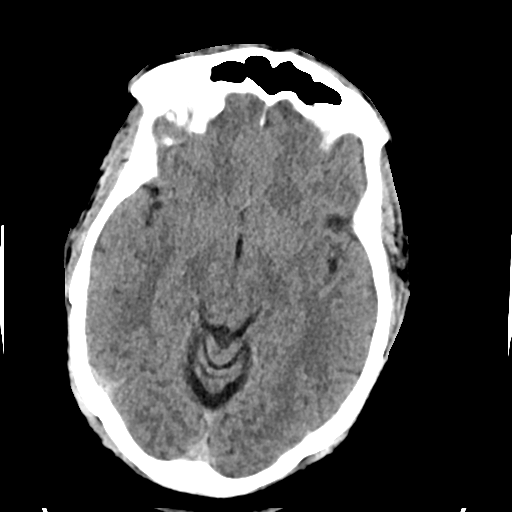
[im 17/34  brain]
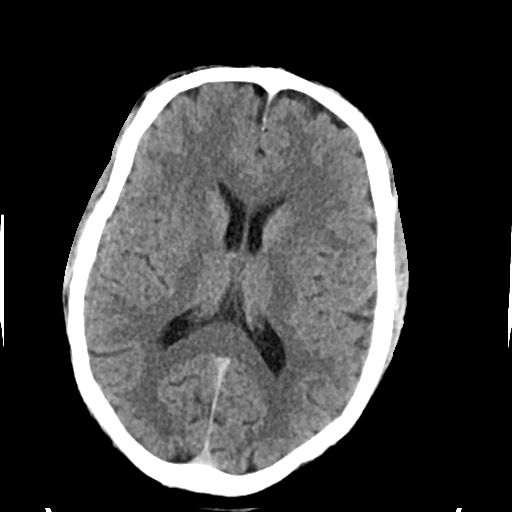
[im 21/34  brain]
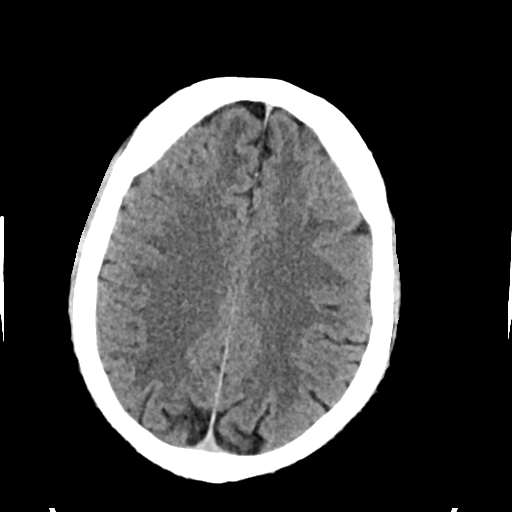
[im 21/34  bone]
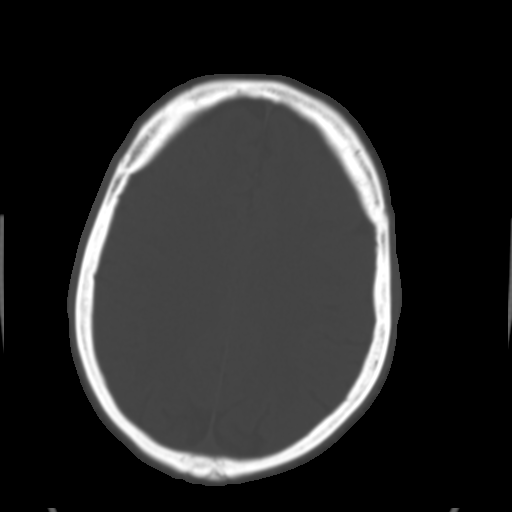
[im 25/34  brain]
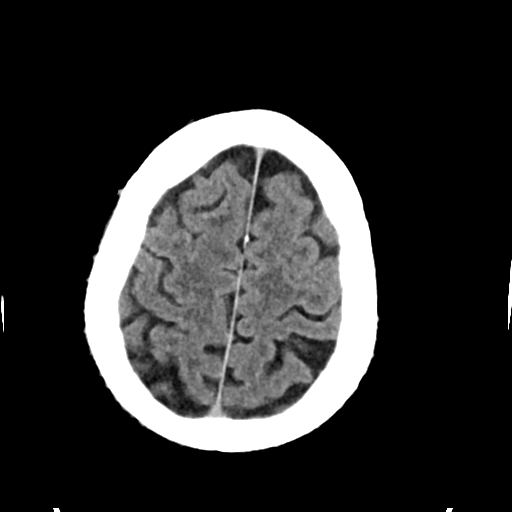
[im 29/34  brain]
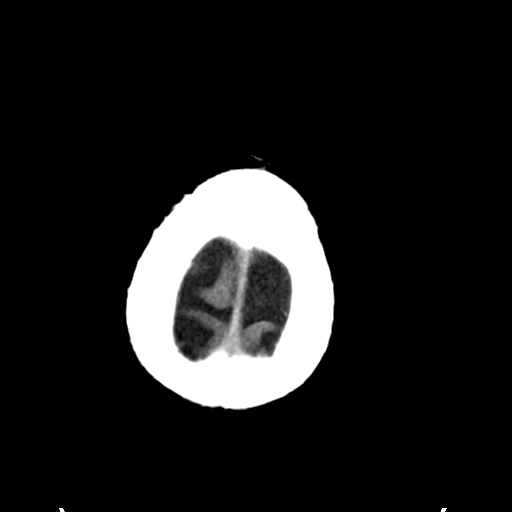

[Series 4: head bone · axial · 0.44mm/px · z∈[-72,-38]mm · 3 of 85 slices shown]
[im 9/85  bone]
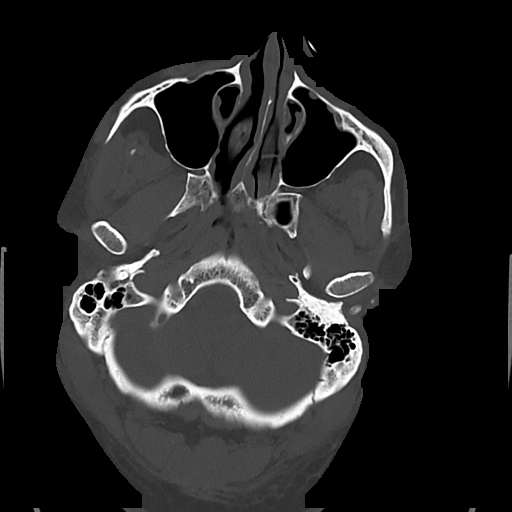
[im 17/85  bone]
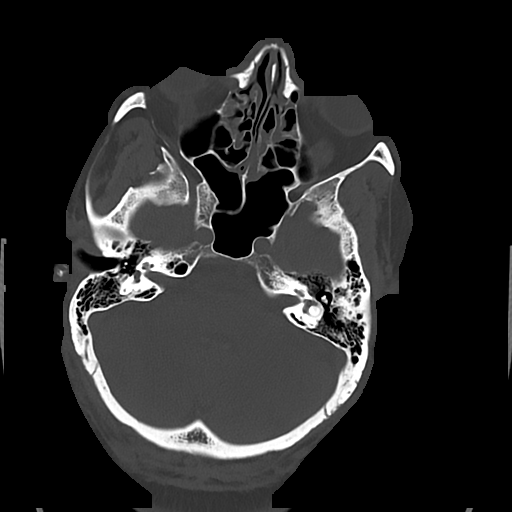
[im 26/85  bone]
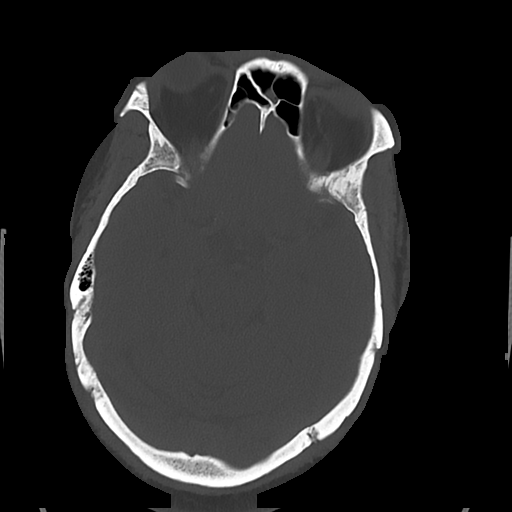

[Series 5: cor soft · coronal · 0.31mm/px · 3 of 73 slices shown]
[im 25/73  brain]
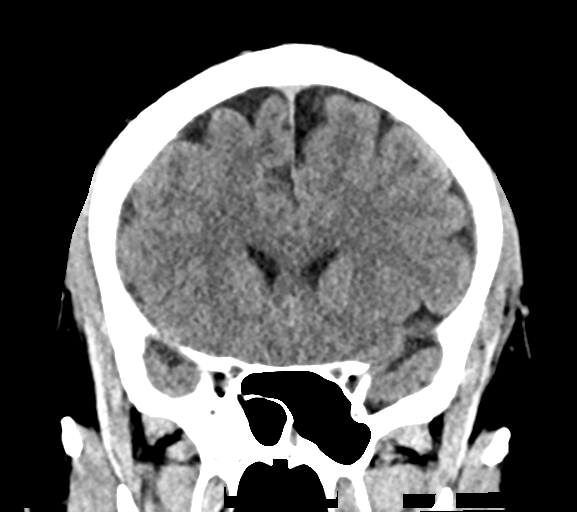
[im 33/73  brain]
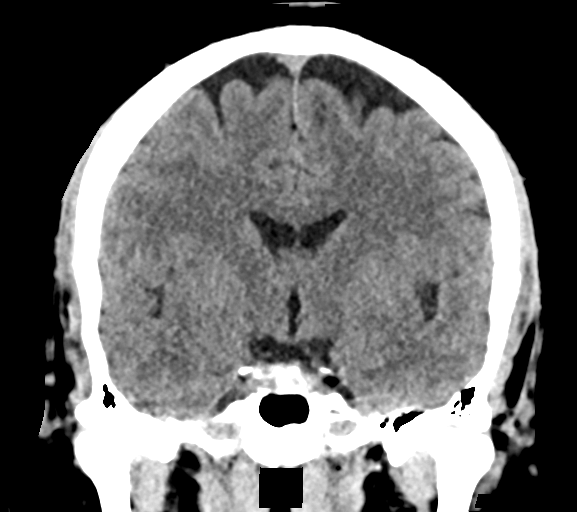
[im 41/73  brain]
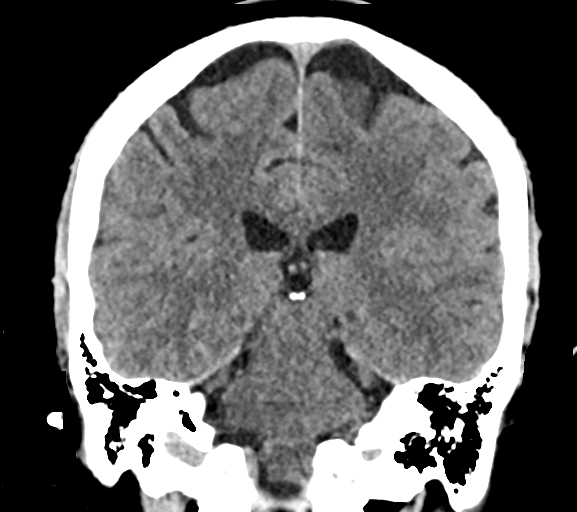

[Series 6: sag soft · sagittal · 0.31mm/px · 3 of 56 slices shown]
[im 19/56  brain]
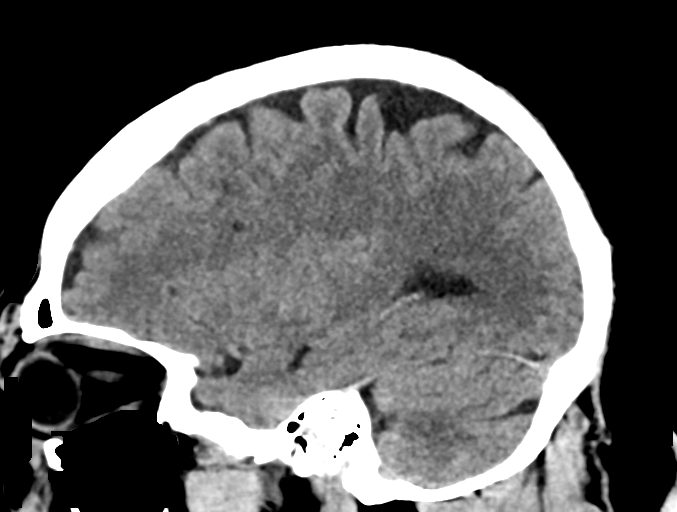
[im 28/56  brain]
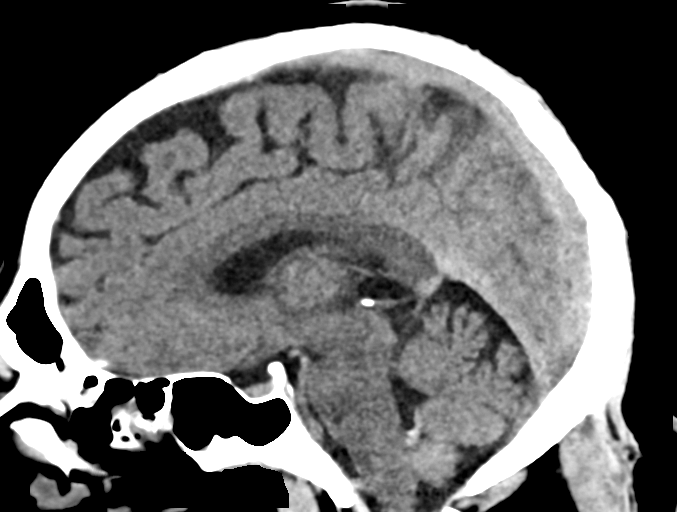
[im 37/56  brain]
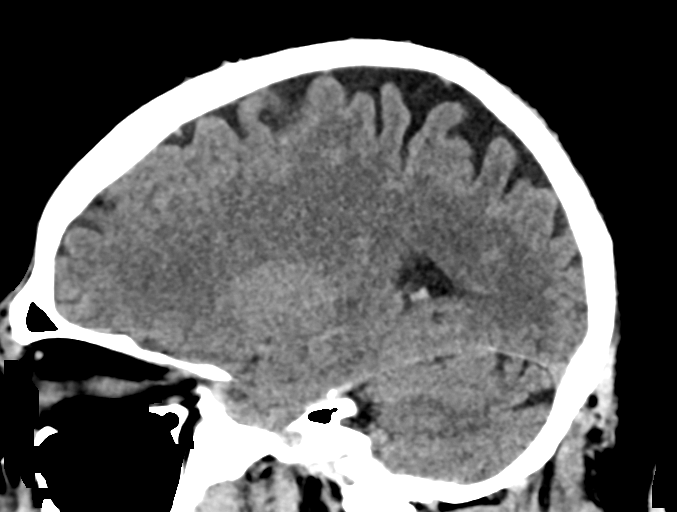

[16 of 47 positions shown; findings below may reference images not displayed]

FINDINGS: Brain: No acute territorial infarction, hemorrhage or intracranial
mass. The ventricles are nonenlarged.

Vascular: No hyperdense vessel or unexpected calcification.

Skull: Normal. Negative for fracture or focal lesion.

Sinuses/Orbits: Mild mucosal thickening in the paranasal sinuses

Other: None
IMPRESSION: Negative non contrasted CT appearance of the brain.

## 2022-05-12 ENCOUNTER — Other Ambulatory Visit: Payer: Self-pay | Admitting: Family Medicine

## 2022-07-15 ENCOUNTER — Other Ambulatory Visit: Payer: Self-pay | Admitting: Family Medicine

## 2022-07-20 ENCOUNTER — Telehealth: Payer: Self-pay | Admitting: Internal Medicine

## 2022-07-20 ENCOUNTER — Telehealth: Payer: Self-pay | Admitting: Family Medicine

## 2022-07-20 NOTE — Telephone Encounter (Signed)
Patient called in and stated that he needs an order/authorization for a CPAP machine to be sent in to [facility]. Please advise. Thank you!

## 2022-07-20 NOTE — Telephone Encounter (Signed)
Carlos Day notified by telephone that the CPAP order will need to come from his pulmonologist.  Patient states understanding and will call their office for order.

## 2022-07-20 NOTE — Telephone Encounter (Signed)
Pt. Calling he needs new order for Cpap machine

## 2022-07-21 ENCOUNTER — Other Ambulatory Visit: Payer: Self-pay

## 2022-07-21 DIAGNOSIS — G4733 Obstructive sleep apnea (adult) (pediatric): Secondary | ICD-10-CM

## 2022-07-21 NOTE — Telephone Encounter (Signed)
Pt. Calling back to speak to someone about getting order for cpap

## 2022-07-21 NOTE — Telephone Encounter (Signed)
Order- [place]- please replace old CPAP machine, change to auto 5-15, mask of choice, humidifier, supplies, AirView/ card

## 2022-07-21 NOTE — Telephone Encounter (Signed)
Spoke with patient he states he needs a new cpap machine. Current machine is broken. Patient advises he has spoke with insurance and they will cover a new machine just need an order. Patient has not been seen since 2022. So I have scheduled for a f/u in may Dr. Annamaria Boots please advise if its okay to place order

## 2022-07-21 NOTE — Telephone Encounter (Signed)
Order has been sent to [facility] for patient to receive a new cpap machine. Patient is aware. Nothing further needed.

## 2022-07-23 ENCOUNTER — Telehealth: Payer: Self-pay | Admitting: Internal Medicine

## 2022-07-23 NOTE — Telephone Encounter (Signed)
ATC X1. Patient picked up the phone. I said hello several times with no response. Will attempt to call back later

## 2022-07-23 NOTE — Telephone Encounter (Signed)
Spoke with patient. Advised per [facility] they need OV notes on patients usage and benefit from using cpap machine. Patient has not been seen in a while and was scheduled in May with Dr. Annamaria Boots. Patient can't wait that long because current machine is broke. I have patient scheduled to see Beth next week. Order for new machine has been placed. [facility] just needs notes sent.  Patient is aware. NFN

## 2022-07-23 NOTE — Telephone Encounter (Signed)
New, Geralynn Rile, Wynona Dove, Melissa Hello Raven,  We are in need of OV face 2 face notes that speak on usage and benefit to process this order request. I looked in the notes and only see telephone order and messages.  Thank you,  Demetrius Charity

## 2022-07-25 NOTE — Telephone Encounter (Signed)
Ok o send my last ov note, if that's what DME wants. Otherwise they will need to wait for Carlos Day's upcoming office visit.

## 2022-07-29 NOTE — Progress Notes (Unsigned)
@Patient  ID: Carlos Day, male    DOB: 1955/12/14, 67 y.o.   MRN: [id number]  No chief complaint on file.   Referring provider: Jinny Sanders, MD  HPI:  NPSG 07/24/04  AHI 99.3/ hr, desaturation to 72%,    Body weight 260 lbs Office Spirometry 11/01/16-moderately severe obstructive airways disease with restriction of exhaled volume. FVC 2.96/60%, FEV1 2.14/57%, ratio 0.7 to, FEF 25-75% 1.50/49%   07/30/2022 Patient presents today for OSA follow-up/CPAP compliance.  He has severe sleep apnea, maintained on CPAP at 11cm h20.    No Known Allergies  Immunization History  Administered Date(s) Administered   Influenza,inj,Quad PF,6+ Mos 01/20/2019, 03/15/2020   Janssen (J&J) SARS-COV-2 Vaccination 08/07/2019   PNEUMOCOCCAL CONJUGATE-20 01/13/2022   Pneumococcal Polysaccharide-23 11/17/2016   Td 01/17/2007   Tdap 02/16/2017    Past Medical History:  Diagnosis Date   Asthma    Sleep apnea    Unspecified essential hypertension     Tobacco History: Social History   Tobacco Use  Smoking Status Never  Smokeless Tobacco Never   Counseling given: Not Answered   Outpatient Medications Prior to Visit  Medication Sig Dispense Refill   albuterol (PROVENTIL) (2.5 MG/3ML) 0.083% nebulizer solution USE THREE MILLILITERS VIA NEBULIZATION BY MOUTH EVERY 6 HOURS AS NEEDED FOR WHEEZING OR FOR SHORTNESS OF BREATH 75 mL 6   albuterol (VENTOLIN HFA) 108 (90 Base) MCG/ACT inhaler Inhale 1-2 puffs into the lungs every 6 (six) hours as needed for wheezing or shortness of breath. 18 g 3   APPLE CIDER VINEGAR PO Take 1 tablet by mouth 2 (two) times daily.     aspirin 81 MG tablet Take 81 mg by mouth daily.     atorvastatin (LIPITOR) 10 MG tablet TAKE 1 TABLET BY MOUTH DAILY 90 tablet 3   diclofenac (VOLTAREN) 75 MG EC tablet Take 1 tablet (75 mg total) by mouth 2 (two) times daily. 30 tablet 0   Dulaglutide (TRULICITY) 4.5 0000000 SOPN Inject 4.5 mg as directed once a week. 2 mL 11    fluticasone-salmeterol (ADVAIR DISKUS) 100-50 MCG/ACT AEPB INHALE 1 PUFF BY MOUTH TWICE A DAY 60 each 5   Insulin Pen Needle (PEN NEEDLES) 30G X 8 MM MISC Inject 0.75 mg into the skin once a week. For trulicity weekly [id number] each 11   ipratropium-albuterol (DUONEB) 0.5-2.5 (3) MG/3ML SOLN Take 3 mLs by nebulization every 6 (six) hours as needed. 175 mL 0   lisinopril-hydrochlorothiazide (ZESTORETIC) 20-12.5 MG tablet TAKE TWO TABLETS BY MOUTH DAILY 60 tablet 5   Multiple Vitamins-Minerals (MULTIVITAMIN WITH MINERALS) tablet Take 1 tablet by mouth daily.     Nebulizers (COMPRESSOR/NEBULIZER) MISC Use as directed 1 each 0   sildenafil (VIAGRA) 100 MG tablet Take 1 tablet (100 mg total) by mouth daily as needed for erectile dysfunction. 10 tablet 0   No facility-administered medications prior to visit.      Review of Systems  Review of Systems   Physical Exam  There were no vitals taken for this visit. Physical Exam   Lab Results:  CBC    Component Value Date/Time   WBC 9.1 05/03/2019 0755   RBC 5.19 05/03/2019 0755   HGB 14.5 05/03/2019 0755   HCT 43.9 05/03/2019 0755   PLT 221.0 05/03/2019 0755   MCV 84.7 05/03/2019 0755   MCHC 33.1 05/03/2019 0755   RDW 15.5 05/03/2019 0755   LYMPHSABS 3.3 05/03/2019 0755   MONOABS 0.9 05/03/2019 0755   EOSABS 0.6 05/03/2019 0755  BASOSABS 0.0 05/03/2019 0755    BMET    Component Value Date/Time   NA 138 01/06/2022 0907   K 4.0 01/06/2022 0907   CL 100 01/06/2022 0907   CO2 30 01/06/2022 0907   GLUCOSE 100 (H) 01/06/2022 0907   BUN 13 01/06/2022 0907   CREATININE 0.96 01/06/2022 0907   CALCIUM 9.9 01/06/2022 0907   GFRNONAA 86.00 09/16/2009 0839   GFRAA 101 06/15/2008 0946    BNP No results found for: "BNP"  ProBNP No results found for: "PROBNP"  Imaging: No results found.   Assessment & Plan:   No problem-specific Assessment & Plan notes found for this encounter.     [name], NP 07/29/2022

## 2022-07-30 ENCOUNTER — Ambulatory Visit (INDEPENDENT_AMBULATORY_CARE_PROVIDER_SITE_OTHER): Payer: Medicare PPO | Admitting: Primary Care

## 2022-07-30 ENCOUNTER — Encounter: Payer: Self-pay | Admitting: Primary Care

## 2022-07-30 VITALS — BP 126/78 | HR 68 | Temp 98.6°F | Ht 70.0 in | Wt 272.2 lb

## 2022-07-30 DIAGNOSIS — G4733 Obstructive sleep apnea (adult) (pediatric): Secondary | ICD-10-CM

## 2022-07-30 DIAGNOSIS — J301 Allergic rhinitis due to pollen: Secondary | ICD-10-CM | POA: Diagnosis not present

## 2022-07-30 DIAGNOSIS — J4540 Moderate persistent asthma, uncomplicated: Secondary | ICD-10-CM | POA: Diagnosis not present

## 2022-07-30 MED ORDER — FLUTICASONE-SALMETEROL 100-50 MCG/ACT IN AEPB: INHALATION_SPRAY | RESPIRATORY_TRACT | 5 refills | Status: DC

## 2022-07-30 MED ORDER — ALBUTEROL SULFATE HFA 108 (90 BASE) MCG/ACT IN AERS: 1.0000 | INHALATION_SPRAY | Freq: Four times a day (QID) | RESPIRATORY_TRACT | 3 refills | Status: DC | PRN

## 2022-07-30 NOTE — Assessment & Plan Note (Signed)
-   Stable; Seasonal allergies will trigger wheezing symptoms. No acutely exacerbated today. Continue Advair 100-52mcg one puffs twice daily and prn Albuterol hfa/neb every 4-6 hours as needed for breakthrough shortness of breath/wheezing. Advised patient notify office if he develops acute symptoms. FU in 6 months or sooner if needed. Refills provided.

## 2022-07-30 NOTE — Assessment & Plan Note (Signed)
-   Continue Claritin and Flonase daily as needed during allergy season

## 2022-07-30 NOTE — Patient Instructions (Addendum)
Recommendations Prioritize getting 4-6 hours or more sleep with CPAP  Work on weight loss Continue Advair one puff twice daily Use Albuterol 2 puffs every 4-6 hours as needed for shortness of breath/wheezing   Orders: New CPAP 11cm h20; Renew CPAP supplies with [facility] (patient using full face mask)   Follow-up: 6 months with Dr. Annamaria Boots

## 2022-07-30 NOTE — Assessment & Plan Note (Signed)
-   Hx severe OSA. Well controlled on CPAP at 11cm h20  - NPSG 07/24/04  AHI 99.3/ hr, desaturation to 72% (Body weight 260 lbs) - Patient is 100% compliant with CPAP use last 90 days, average usage 4 hours 18 mins  - Current pressure 11cm h20; Residual AHI 2.7/hour  - DME order placed for patient to be provided new CPAP machine at current pressure settings and renew supplies with [facility] - Encourage patient aim to get 4 to 6 hours of CPAP use per night - Continue weight loss efforts - FU in 6 months or sooner if needed

## 2022-08-11 DIAGNOSIS — G4733 Obstructive sleep apnea (adult) (pediatric): Secondary | ICD-10-CM | POA: Diagnosis not present

## 2022-09-01 ENCOUNTER — Ambulatory Visit: Payer: Medicare PPO | Admitting: Internal Medicine

## 2022-09-10 DIAGNOSIS — G4733 Obstructive sleep apnea (adult) (pediatric): Secondary | ICD-10-CM | POA: Diagnosis not present

## 2022-10-07 ENCOUNTER — Telehealth: Payer: Self-pay | Admitting: *Deleted

## 2022-10-07 ENCOUNTER — Other Ambulatory Visit: Payer: Self-pay | Admitting: Pharmacist

## 2022-10-07 ENCOUNTER — Telehealth: Payer: Self-pay | Admitting: Pharmacist

## 2022-10-07 NOTE — Telephone Encounter (Signed)
Received letter from [facility] stating PA for Ozempic 0.25-0.5 mg was approved through 04/27/2023.

## 2022-10-07 NOTE — Progress Notes (Signed)
Pharmacy Quality Measure Review  This patient is appearing on the insurance-provided list for being at risk of failing the adherence measure for diabetes medications this calendar year.   Medication: Trulicity 4.5 mg  Last fill date: 07/13/22 for a 28 day supply  Contacted patient, left voicemail to give me a call. This strength of Turlicity is on backorder right now. May need to Ozempic. Will also send MyChart.   Catie Eppie Gibson, PharmD, BCACP, CPP [facility] [phone]

## 2022-10-07 NOTE — Progress Notes (Signed)
Pharmacy Quality Measure Review  This patient is appearing on the insurance-provided list for being at risk of failing the adherence measure for diabetes medications this calendar year.   Medication: Trulicity 4.5 mg Last fill date: 07/13/22 for a 28 day supply, but notes his pharmacy just called him that they can fill 1 month.   However, patient has been out of therapy for a few months and is worried about starting back on high dose Trulicity. We discussed Ozempic, as this was previously recommended. He notes it was too expensive at the time, but that his Trulicity copay is now $11.20. This indicates he has Medicare Extra Help, all brand copays will be $11.20 for a 30 or 90 day supply. He is interested in changing to Ozempic as he has heard it is more effective.   Recommend to not pick up Trulicity, start Ozempic at 0.25 mg weekly for 4 weeks, then increase to 0.5 mg weekly. PA submitted via Cover My Meds, will follow for outcome.   Catie Eppie Gibson, PharmD, BCACP, CPP [facility] [phone]

## 2022-10-08 MED ORDER — OZEMPIC (0.25 OR 0.5 MG/DOSE) 2 MG/3ML ~~LOC~~ SOPN: 0.5000 mg | PEN_INJECTOR | SUBCUTANEOUS | 1 refills | Status: DC

## 2022-10-08 NOTE — Patient Instructions (Signed)
Start Ozempic 0.25 mg weekly for 4 weeks, then increase to 0.5 mg weekly.    This medication may cause stomach upset, queasiness, or constipation, especially when first starting. This generally improves over time. Call our office if these symptoms occur and worsen, or if you have severe symptoms such as vomiting, diarrhea, or stomach pain.

## 2022-10-08 NOTE — Progress Notes (Addendum)
Care Coordination Call  Ozempic PA approved. PCP in agreement with plan. Order placed and patient notified to inject 0.25 mg weekly for 4 weeks, then increase to 0.5 mg weekly thereafter.   Follow up scheduled in 6 weeks with me.   Catie Eppie Gibson, PharmD, BCACP, CPP [facility] [phone]

## 2022-10-11 DIAGNOSIS — G4733 Obstructive sleep apnea (adult) (pediatric): Secondary | ICD-10-CM | POA: Diagnosis not present

## 2022-11-04 NOTE — Progress Notes (Signed)
Subjective:    Patient ID: Carlos Day, male    DOB: December 19, 1955, 67 y.o.   MRN: [id number]  HPI male never smoker followed for OSA, asthma, complicated by HBP NPSG 07/24/04  AHI 99.3/ hr, desaturation to 72%,    Body weight 260 lbs Office Spirometry 11/01/16-moderately severe obstructive airways disease with restriction of exhaled volume. FVC 2.96/60%, FEV1 2.14/57%, ratio 0.7 to, FEF 25-75% 1.50/49% -----------------------------------------------------------------------    11/11/20- 67 year old male never smoker followed for OSA, Asthma, complicated by HBP - neb Duoneb, Ventolin hfa, Advair 100,  Prednisone taper 5/22 from PCP CPAP 11/ [facility]        waiting on replacement due to cost (deductible) Download- compliance 83%, AHI 4.4/ hr Body weight today-293 lbs Covid vax- J&J -----Patient is sleeping good and machine is working good. Still using his old machine, has not got the new machine yet due to cost.  ED 03/27/20- Pneumonia ? Covid> Zpak, decadron, prednisone He can't afford copay for replacement CPAP (250$) right now, but old machine is working ok.  Advair costs him 50$, which is also hard for him but it works well.  Wheezing and chest-tightness are occasional. He feels well today and completely resolved his pneumonia from last winter. CXR 03/27/20-  IMPRESSION: Multifocal infiltrate concerning for atypical pneumonia. Clinical correlation is recommended.  11/06/22- 67 year old male never smoker followed for OSA, Asthma mocderate persistent, complicated by HBP - neb Duoneb, Ventolin hfa, Advair 100,  Prednisone taper 5/22 from PCP CPAP 11/ [facility]    [employer]    Download- compliance ([employer])- 73%, AHI 1.7/ hr Body weight today- 290 lbs LOV 4/4/24Clent Ridges, NP- ordered replacement for old CPAP 11 cwp Download reviewed. Doing fine with CPAP. Asthma control doing better. Advair helps. Occasional nebulizer.  ROS-see HPI + = positive Constitutional:   + weight loss,  night sweats, fevers, chills, fatigue, lassitude. HEENT:   No-  headaches, difficulty swallowing, tooth/dental problems, sore throat,       No-  sneezing, itching, ear ache, nasal congestion, post nasal drip,  CV:  No-   chest pain, orthopnea, PND, swelling in lower extremities, anasarca, dizziness, palpitations Resp: +   shortness of breath with exertion or at rest.              No-   productive cough,  No non-productive cough,  No- coughing up of blood.              No-   change in color of mucus.  +little wheezing.   Skin: No-   rash or lesions. GI:  No-   heartburn, indigestion, abdominal pain, nausea, vomiting,  GU: . MS:  No-   joint pain or swelling. . Neuro-     nothing unusual Psych:  No- change in mood or affect. No depression or anxiety.  No memory loss.  OBJ- Physical Exam General- Alert, Oriented, Affect-appropriate, Distress- none acute, + overweight/ muscular Skin- rash-none, lesions- none, excoriation- none Lymphadenopathy- none Head- atraumatic            Eyes- Gross vision intact, PERRLA, conjunctivae and secretions clear            Ears- Hearing, canals-normal            Nose- Clear, no-Septal dev, mucus, polyps, erosion, perforation             Throat- Mallampati III , mucosa clear , drainage- none, tonsils- atrophic Neck- flexible , trachea midline, no stridor , thyroid nl, carotid no  bruit Chest - symmetrical excursion , unlabored           Heart/CV- RRR , no murmur , no gallop  , no rub, nl s1 s2                           - JVD- none , edema- none, stasis changes- none, varices- none           Lung- + clear, cough- none , dullness-none, rub- none           Chest wall-  Abd-  Br/ Gen/ Rectal- Not done, not indicated Extrem- cyanosis- none, clubbing, none, atrophy- none, strength- nl Neuro- grossly intact to observation

## 2022-11-06 ENCOUNTER — Encounter: Payer: Self-pay | Admitting: Internal Medicine

## 2022-11-06 ENCOUNTER — Ambulatory Visit: Payer: Medicare PPO | Admitting: Internal Medicine

## 2022-11-06 VITALS — BP 138/84 | HR 54 | Ht 71.0 in | Wt 290.0 lb

## 2022-11-06 DIAGNOSIS — J4540 Moderate persistent asthma, uncomplicated: Secondary | ICD-10-CM | POA: Diagnosis not present

## 2022-11-06 NOTE — Patient Instructions (Signed)
We can continue CPAP 11- you are doing well with this.  We can continue your current breathing meds. Please let us know if you need refills or if we can help.

## 2022-11-09 DIAGNOSIS — G4733 Obstructive sleep apnea (adult) (pediatric): Secondary | ICD-10-CM | POA: Diagnosis not present

## 2022-11-10 DIAGNOSIS — G4733 Obstructive sleep apnea (adult) (pediatric): Secondary | ICD-10-CM | POA: Diagnosis not present

## 2022-11-16 ENCOUNTER — Encounter: Payer: Self-pay | Admitting: Pharmacist

## 2022-11-19 ENCOUNTER — Other Ambulatory Visit: Payer: Medicare PPO | Admitting: Pharmacist

## 2022-12-08 ENCOUNTER — Encounter: Payer: Self-pay | Admitting: Internal Medicine

## 2022-12-08 NOTE — Assessment & Plan Note (Signed)
Satisfactory control without significant exacerbation. Plan- continue current meds

## 2022-12-10 ENCOUNTER — Other Ambulatory Visit: Payer: Medicare PPO | Admitting: Pharmacist

## 2022-12-10 ENCOUNTER — Encounter (INDEPENDENT_AMBULATORY_CARE_PROVIDER_SITE_OTHER): Payer: Self-pay

## 2022-12-10 NOTE — Progress Notes (Signed)
Care Coordination Call  Briefly spoke with patient. He denies any concerns with Ozempic. He is overdue for PCP follow up, but will be due for his physical in September. Will discuss with Dr. Ermalene Searing for when she would like him scheduled with her.   Catie Eppie Gibson, PharmD, BCACP, CPP Clinical Pharmacist [facility] [phone]

## 2022-12-11 ENCOUNTER — Other Ambulatory Visit: Payer: Self-pay | Admitting: Family Medicine

## 2022-12-11 DIAGNOSIS — Z125 Encounter for screening for malignant neoplasm of prostate: Secondary | ICD-10-CM

## 2022-12-11 DIAGNOSIS — E1159 Type 2 diabetes mellitus with other circulatory complications: Secondary | ICD-10-CM

## 2022-12-11 DIAGNOSIS — G4733 Obstructive sleep apnea (adult) (pediatric): Secondary | ICD-10-CM | POA: Diagnosis not present

## 2023-01-01 ENCOUNTER — Other Ambulatory Visit (INDEPENDENT_AMBULATORY_CARE_PROVIDER_SITE_OTHER): Payer: Medicare PPO

## 2023-01-01 ENCOUNTER — Other Ambulatory Visit: Payer: Self-pay | Admitting: Family Medicine

## 2023-01-01 DIAGNOSIS — Z125 Encounter for screening for malignant neoplasm of prostate: Secondary | ICD-10-CM | POA: Diagnosis not present

## 2023-01-01 DIAGNOSIS — E1159 Type 2 diabetes mellitus with other circulatory complications: Secondary | ICD-10-CM

## 2023-01-01 LAB — LIPID PANEL
Cholesterol: 122 mg/dL (ref 0–200)
HDL: 42.2 mg/dL
LDL Cholesterol: 62 mg/dL (ref 0–99)
NonHDL: 79.86
Total CHOL/HDL Ratio: 3
Triglycerides: 87 mg/dL (ref 0.0–149.0)
VLDL: 17.4 mg/dL (ref 0.0–40.0)

## 2023-01-01 LAB — HEMOGLOBIN A1C: Hgb A1c MFr Bld: 6.5 % (ref 4.6–6.5)

## 2023-01-01 LAB — COMPREHENSIVE METABOLIC PANEL
ALT: 49 U/L (ref 0–53)
AST: 32 U/L (ref 0–37)
Albumin: 4.3 g/dL (ref 3.5–5.2)
Alkaline Phosphatase: 64 U/L (ref 39–117)
BUN: 9 mg/dL (ref 6–23)
CO2: 32 meq/L (ref 19–32)
Calcium: 9.7 mg/dL (ref 8.4–10.5)
Chloride: 101 meq/L (ref 96–112)
Creatinine, Ser: 0.88 mg/dL (ref 0.40–1.50)
GFR: 89.64 mL/min (ref >60.00)
Glucose, Bld: 97 mg/dL (ref 70–99)
Potassium: 3.7 mEq/L (ref 3.5–5.1)
Sodium: 140 meq/L (ref 135–145)
Total Bilirubin: 0.7 mg/dL (ref 0.2–1.2)
Total Protein: 7.1 g/dL (ref 6.0–8.3)

## 2023-01-01 LAB — MICROALBUMIN / CREATININE URINE RATIO
Creatinine,U: 105.8 mg/dL
Microalb Creat Ratio: 6 mg/g (ref 0.0–30.0)
Microalb, Ur: 6.3 mg/dL — ABNORMAL HIGH (ref 0.0–1.9)

## 2023-01-01 LAB — PSA, MEDICARE: PSA: 0.62 ng/mL (ref 0.10–4.00)

## 2023-01-01 NOTE — Progress Notes (Signed)
No critical labs need to be addressed urgently. We will discuss labs in detail at upcoming office visit.   

## 2023-01-06 ENCOUNTER — Encounter: Payer: Medicare PPO | Admitting: Family Medicine

## 2023-01-11 DIAGNOSIS — G4733 Obstructive sleep apnea (adult) (pediatric): Secondary | ICD-10-CM | POA: Diagnosis not present

## 2023-01-18 ENCOUNTER — Ambulatory Visit (INDEPENDENT_AMBULATORY_CARE_PROVIDER_SITE_OTHER): Payer: Medicare PPO | Admitting: Family Medicine

## 2023-01-18 ENCOUNTER — Encounter: Payer: Self-pay | Admitting: Family Medicine

## 2023-01-18 ENCOUNTER — Ambulatory Visit: Payer: Medicare PPO

## 2023-01-18 VITALS — BP 142/80 | HR 50 | Temp 97.7°F | Ht 71.0 in | Wt 293.0 lb

## 2023-01-18 DIAGNOSIS — Z0000 Encounter for general adult medical examination without abnormal findings: Secondary | ICD-10-CM

## 2023-01-18 DIAGNOSIS — I152 Hypertension secondary to endocrine disorders: Secondary | ICD-10-CM

## 2023-01-18 DIAGNOSIS — Z7985 Long-term (current) use of injectable non-insulin antidiabetic drugs: Secondary | ICD-10-CM

## 2023-01-18 DIAGNOSIS — E1169 Type 2 diabetes mellitus with other specified complication: Secondary | ICD-10-CM | POA: Diagnosis not present

## 2023-01-18 DIAGNOSIS — E785 Hyperlipidemia, unspecified: Secondary | ICD-10-CM

## 2023-01-18 DIAGNOSIS — E1159 Type 2 diabetes mellitus with other circulatory complications: Secondary | ICD-10-CM

## 2023-01-18 DIAGNOSIS — J4540 Moderate persistent asthma, uncomplicated: Secondary | ICD-10-CM

## 2023-01-18 LAB — HM DIABETES FOOT EXAM

## 2023-01-18 MED ORDER — SEMAGLUTIDE (1 MG/DOSE) 4 MG/3ML ~~LOC~~ SOPN: 1.0000 mg | PEN_INJECTOR | SUBCUTANEOUS | 11 refills | Status: DC

## 2023-01-18 NOTE — Assessment & Plan Note (Addendum)
Chronic,good control...  On semaglutide 0.5 mg weekly  Minimal weight loss associated... will try trial of increased dose to 1 mg weekly.  Encouraged exercise, weight loss, healthy eating habits.

## 2023-01-18 NOTE — Patient Instructions (Addendum)
Call to set up colonoscopy: [facility]  [phone]  Consider shingrix vaccine.

## 2023-01-18 NOTE — Assessment & Plan Note (Signed)
Stable, chronic.  Continue current medication.  Atorvastatin 10 mg p.o. daily

## 2023-01-18 NOTE — Assessment & Plan Note (Signed)
Chronic, improved control back on Advair 100/50 twice daily.  follow-up with pulmonology.

## 2023-01-18 NOTE — Assessment & Plan Note (Signed)
Encouraged exercise, weight loss, healthy eating habits. Minimal weight loss with semaglutide 0.5 mg p.o. weekly, will consider increasing dose and continue working on lifestyle changes.

## 2023-01-18 NOTE — Progress Notes (Signed)
Patient ID: Carlos Day, male    DOB: 02-Feb-1956, 67 y.o.   MRN: [id number]  This visit was conducted in person.  BP (!) 142/80   Pulse (!) 50   Temp 97.7 F (36.5 C) (Temporal)   Ht 5\' 11"  (1.803 m)   Wt 293 lb (132.9 kg)   SpO2 98%   BMI 40.87 kg/m    CC:  Chief Complaint  Patient presents with   Annual Exam    Subjective:   HPI: Carlos Day is a 67 y.o. male presenting on 01/18/2023 for Annual Exam  The patient presents for annual medicare wellness, complete physical and review of chronic health problems. He/She also has the following acute concerns today: I have personally reviewed the Medicare Annual Wellness questionnaire and have noted 1. The patient's medical and social history 2. Their use of alcohol, tobacco or illicit drugs 3. Their current medications and supplements 4. The patient's functional ability including ADL's, fall risks, home safety risks and hearing or visual             impairment. 5. Diet and physical activities 6. Evidence for depression or mood disorders 7.         Updated provider list Cognitive evaluation was performed and recorded on pt medicare questionnaire form. The patients weight, height, BMI and visual acuity have been recorded in the chart   I have made referrals, counseling and provided education to the patient based review of the above and I have provided the pt with a written personalized care plan for preventive services.    Documentation of this information was scanned into the electronic record under the media tab.   No falls in last 12 months.  No results found.  Flowsheet Row Office Visit from 01/18/2023 in [facility]  PHQ-2 Total Score 0      Advance directives and end of life planning reviewed in detail with patient and documented in EMR. Patient given handout on advance care directives if needed. HCPOA and living will updated if needed.  Diabetes: Well-controlled on  semaglutide  0.5 mg p.o. weekly. Lab Results  Component Value Date   HGBA1C 6.5 01/01/2023  Using medications without difficulties: Hypoglycemic episodes: Hyperglycemic episodes: Feet problems: no ulcer Blood Sugars averaging:  not checking eye exam within last year: yes  Wt Readings from Last 3 Encounters:  01/18/23 293 lb (132.9 kg)  11/06/22 290 lb (131.5 kg)  07/30/22 272 lb 3.2 oz (123.5 kg)  Body mass index is 40.87 kg/m.  Hypertension:  Well controlled  On lisinopril hydrochlorothiazide 20/12.5 mg 2 tablets p.o. daily BP Readings from Last 3 Encounters:  01/18/23 (!) 142/80  11/06/22 138/84  07/30/22 126/78  Using medication without problems or lightheadedness:  none Chest pain with exertion:none Edema: none Short of breath: none Average home BPs: Other issues:   Elevated Cholesterol: LDL at goal less than 100 on atorvastatin 10 mg daily Lab Results  Component Value Date   CHOL 122 01/01/2023   HDL 42.20 01/01/2023   LDLCALC 62 01/01/2023   LDLDIRECT 125.0 11/17/2016   TRIG 87.0 01/01/2023   CHOLHDL 3 01/01/2023  Using medications without problems: Muscle aches:  Diet compliance: moderate Exercise: trying to increase.. trying to increase bike. Other complaints:  Asthma moderate persistent: Ran out of Advair... was more SOB, but feeling better back on it.   Has albuterol prn.   He has noted some issues getting urine out to empty.  2 urination  at night.     Relevant past medical, surgical, family and social history reviewed and updated as indicated. Interim medical history since our last visit reviewed. Allergies and medications reviewed and updated. Outpatient Medications Prior to Visit  Medication Sig Dispense Refill   albuterol (PROVENTIL) (2.5 MG/3ML) 0.083% nebulizer solution USE THREE MILLILITERS VIA NEBULIZATION BY MOUTH EVERY 6 HOURS AS NEEDED FOR WHEEZING OR FOR SHORTNESS OF BREATH 75 mL 6   albuterol (VENTOLIN HFA) 108 (90 Base) MCG/ACT inhaler Inhale 1-2  puffs into the lungs every 6 (six) hours as needed for wheezing or shortness of breath. 18 g 3   APPLE CIDER VINEGAR PO Take 1 tablet by mouth 2 (two) times daily.     aspirin 81 MG tablet Take 81 mg by mouth daily.     atorvastatin (LIPITOR) 10 MG tablet TAKE 1 TABLET BY MOUTH DAILY 90 tablet 3   diclofenac (VOLTAREN) 75 MG EC tablet Take 1 tablet (75 mg total) by mouth 2 (two) times daily. 30 tablet 0   fluticasone-salmeterol (ADVAIR DISKUS) 100-50 MCG/ACT AEPB INHALE 1 PUFF BY MOUTH TWICE A DAY 60 each 5   Insulin Pen Needle (PEN NEEDLES) 30G X 8 MM MISC Inject 0.75 mg into the skin once a week. For trulicity weekly 100 each 11   lisinopril-hydrochlorothiazide (ZESTORETIC) 20-12.5 MG tablet Take 1 tablet by mouth 2 (two) times daily. 180 tablet 0   Multiple Vitamins-Minerals (MULTIVITAMIN WITH MINERALS) tablet Take 1 tablet by mouth daily.     Nebulizers (COMPRESSOR/NEBULIZER) MISC Use as directed 1 each 0   sildenafil (VIAGRA) 100 MG tablet Take 1 tablet (100 mg total) by mouth daily as needed for erectile dysfunction. 10 tablet 0   Semaglutide,0.25 or 0.5MG /DOS, (OZEMPIC, 0.25 OR 0.5 MG/DOSE,) 2 MG/3ML SOPN Inject 0.5 mg into the skin once a week. 9 mL 1   No facility-administered medications prior to visit.     Per HPI unless specifically indicated in ROS section below Review of Systems  Constitutional:  Negative for fatigue and fever.  HENT:  Negative for ear pain.   Eyes:  Negative for pain.  Respiratory:  Negative for cough and shortness of breath.   Cardiovascular:  Negative for chest pain, palpitations and leg swelling.  Gastrointestinal:  Negative for abdominal pain.  Genitourinary:  Positive for frequency. Negative for dysuria.  Musculoskeletal:  Negative for arthralgias.  Neurological:  Negative for syncope, light-headedness and headaches.  Psychiatric/Behavioral:  Negative for dysphoric mood.    Objective:  BP (!) 142/80   Pulse (!) 50   Temp 97.7 F (36.5 C)  (Temporal)   Ht 5\' 11"  (1.803 m)   Wt 293 lb (132.9 kg)   SpO2 98%   BMI 40.87 kg/m   Wt Readings from Last 3 Encounters:  01/18/23 293 lb (132.9 kg)  11/06/22 290 lb (131.5 kg)  07/30/22 272 lb 3.2 oz (123.5 kg)      Physical Exam Constitutional:      Appearance: He is well-developed.  HENT:     Head: Normocephalic.     Right Ear: Hearing normal.     Left Ear: Hearing normal.     Nose: Nose normal.  Neck:     Thyroid: No thyroid mass or thyromegaly.     Vascular: No carotid bruit.     Trachea: Trachea normal.  Cardiovascular:     Rate and Rhythm: Normal rate and regular rhythm.     Pulses: Normal pulses.     Heart sounds: Heart sounds  not distant. No murmur heard.    No friction rub. No gallop.     Comments: No peripheral edema Pulmonary:     Effort: Pulmonary effort is normal. No respiratory distress.     Breath sounds: Normal breath sounds.  Skin:    General: Skin is warm and dry.     Findings: No rash.  Psychiatric:        Speech: Speech normal.        Behavior: Behavior normal.        Thought Content: Thought content normal.     Diabetic foot exam: Normal inspection No skin breakdown No calluses  Normal DP pulses Normal sensation to light touch and monofilament Nails normal     Results for orders placed or performed in visit on 01/18/23  HM DIABETES FOOT EXAM  Result Value Ref Range   HM Diabetic Foot Exam done      COVID 19 screen:  No recent travel or known exposure to COVID19 The patient denies respiratory symptoms of COVID 19 at this time. The importance of social distancing was discussed today.   Assessment and Plan   The patient's preventative maintenance and recommended screening tests for an annual wellness exam were reviewed in full today. Brought up to date unless services declined.  Counselled on the importance of diet, exercise, and its role in overall health and mortality. The patient's FH and SH was reviewed, including their home  life, tobacco status, and drug and alcohol status.     Refused flu vaccines given  intolerant, uptodate with td , UTD prevnar 20 and    Consider shingrix No family history of prostate cancer Lab Results  Component Value Date   PSA 0.62 01/01/2023   PSA 0.62 01/06/2022   PSA 0.48 03/12/2020  Colon Ca [place]:  no family colon cancer. Last colon in 2014, sessile polyp Dr. Larae Grooms, repeat due 2024.  Hep C: done  HIV : done  nonsmoker  . Problem List Items Addressed This Visit     Asthma, moderate persistent (Chronic)     Chronic, improved control back on Advair 100/50 twice daily.  follow-up with pulmonology.        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  On semaglutide 0.5 mg weekly  Minimal weight loss associated... will try trial of increased dose to 1 mg weekly.  Encouraged exercise, weight loss, healthy eating habits.       Relevant Medications   Semaglutide, 1 MG/DOSE, 4 MG/3ML SOPN   Hyperlipidemia associated with type 2 diabetes mellitus (HCC) (Chronic)    Stable, chronic.  Continue current medication.   Atorvastatin 10 mg p.o. daily      Relevant Medications   Semaglutide, 1 MG/DOSE, 4 MG/3ML SOPN   Hypertension associated with diabetes (HCC) (Chronic)    Well controlled   On lisinopril hydrochlorothiazide 20/12.5 mg 2 tablets p.o. daily      Relevant Medications   Semaglutide, 1 MG/DOSE, 4 MG/3ML SOPN   Morbid obesity (HCC) (Chronic)    Encouraged exercise, weight loss, healthy eating habits. Minimal weight loss with semaglutide 0.5 mg p.o. weekly, will consider increasing dose and continue working on lifestyle changes.      Relevant Medications   Semaglutide, 1 MG/DOSE, 4 MG/3ML SOPN   Other Visit Diagnoses     Medicare annual wellness visit, subsequent    -  Primary         Mateusz Neilan  Ermalene Searing, MD

## 2023-01-18 NOTE — Assessment & Plan Note (Signed)
Well controlled   On lisinopril hydrochlorothiazide 20/12.5 mg 2 tablets p.o. daily

## 2023-01-28 DIAGNOSIS — H524 Presbyopia: Secondary | ICD-10-CM | POA: Diagnosis not present

## 2023-01-28 DIAGNOSIS — H52223 Regular astigmatism, bilateral: Secondary | ICD-10-CM | POA: Diagnosis not present

## 2023-01-28 DIAGNOSIS — E119 Type 2 diabetes mellitus without complications: Secondary | ICD-10-CM | POA: Diagnosis not present

## 2023-01-28 DIAGNOSIS — H5203 Hypermetropia, bilateral: Secondary | ICD-10-CM | POA: Diagnosis not present

## 2023-01-28 LAB — HM DIABETES EYE EXAM

## 2023-01-29 ENCOUNTER — Other Ambulatory Visit: Payer: Self-pay | Admitting: Family Medicine

## 2023-01-29 DIAGNOSIS — Z1211 Encounter for screening for malignant neoplasm of colon: Secondary | ICD-10-CM

## 2023-01-29 DIAGNOSIS — Z1212 Encounter for screening for malignant neoplasm of rectum: Secondary | ICD-10-CM

## 2023-01-30 NOTE — Progress Notes (Deleted)
Subjective:    Patient ID: Carlos Day, male    DOB: 03-Dec-1955, 67 y.o.   MRN: [id number]  HPI male never smoker followed for OSA, asthma, complicated by HBP NPSG 07/24/04  AHI 99.3/ hr, desaturation to 72%,    Body weight 260 lbs Office Spirometry 11/01/16-moderately severe obstructive airways disease with restriction of exhaled volume. FVC 2.96/60%, FEV1 2.14/57%, ratio 0.7 to, FEF 25-75% 1.50/49% -----------------------------------------------------------------------  11/06/22- 67 year old male never smoker followed for OSA, Asthma mocderate persistent, complicated by HBP - neb Duoneb, Ventolin hfa, Advair 100,  Prednisone taper 5/22 from PCP CPAP 11/ [employer]    [employer] G3 [employer]    Download- compliance ([employer])- 73%, AHI 1.7/ hr Body weight today- 290 lbs LOV 4/4/24Clent Ridges, NP- ordered replacement for old CPAP 11 cwp Download reviewed. Doing fine with CPAP. Asthma control doing better. Advair helps. Occasional nebulizer.  02/01/23- 67 year old male never smoker followed for OSA, Asthma mocderate persistent, complicated by HBP - neb Duoneb, Ventolin hfa, Advair 100,  Prednisone taper 5/22 from PCP CPAP 11/ [facility]    Luna G3 [employer]    Download- compliance ([employer])-  Body weight today-    ROS-see HPI + = positive Constitutional:   + weight loss, night sweats, fevers, chills, fatigue, lassitude. HEENT:   No-  headaches, difficulty swallowing, tooth/dental problems, sore throat,       No-  sneezing, itching, ear ache, nasal congestion, post nasal drip,  CV:  No-   chest pain, orthopnea, PND, swelling in lower extremities, anasarca, dizziness, palpitations Resp: +   shortness of breath with exertion or at rest.              No-   productive cough,  No non-productive cough,  No- coughing up of blood.              No-   change in color of mucus.  +little wheezing.   Skin: No-   rash or lesions. GI:  No-   heartburn, indigestion, abdominal pain, nausea, vomiting,  GU: . MS:  No-    joint pain or swelling. . Neuro-     nothing unusual Psych:  No- change in mood or affect. No depression or anxiety.  No memory loss.  OBJ- Physical Exam General- Alert, Oriented, Affect-appropriate, Distress- none acute, + overweight/ muscular Skin- rash-none, lesions- none, excoriation- none Lymphadenopathy- none Head- atraumatic            Eyes- Gross vision intact, PERRLA, conjunctivae and secretions clear            Ears- Hearing, canals-normal            Nose- Clear, no-Septal dev, mucus, polyps, erosion, perforation             Throat- Mallampati III , mucosa clear , drainage- none, tonsils- atrophic Neck- flexible , trachea midline, no stridor , thyroid nl, carotid no bruit Chest - symmetrical excursion , unlabored           Heart/CV- RRR , no murmur , no gallop  , no rub, nl s1 s2                           - JVD- none , edema- none, stasis changes- none, varices- none           Lung- + clear, cough- none , dullness-none, rub- none           Chest wall-  Abd-  Br/ Gen/ Rectal-  Not done, not indicated Extrem- cyanosis- none, clubbing, none, atrophy- none, strength- nl Neuro- grossly intact to observation

## 2023-02-01 ENCOUNTER — Ambulatory Visit: Payer: Medicare PPO | Admitting: Internal Medicine

## 2023-02-02 ENCOUNTER — Encounter: Payer: Self-pay | Admitting: Internal Medicine

## 2023-02-08 DIAGNOSIS — G4733 Obstructive sleep apnea (adult) (pediatric): Secondary | ICD-10-CM | POA: Diagnosis not present

## 2023-02-10 DIAGNOSIS — G4733 Obstructive sleep apnea (adult) (pediatric): Secondary | ICD-10-CM | POA: Diagnosis not present

## 2023-02-16 DIAGNOSIS — Z1212 Encounter for screening for malignant neoplasm of rectum: Secondary | ICD-10-CM | POA: Diagnosis not present

## 2023-02-16 DIAGNOSIS — Z1211 Encounter for screening for malignant neoplasm of colon: Secondary | ICD-10-CM | POA: Diagnosis not present

## 2023-02-24 LAB — COLOGUARD: COLOGUARD: NEGATIVE

## 2023-03-13 DIAGNOSIS — G4733 Obstructive sleep apnea (adult) (pediatric): Secondary | ICD-10-CM | POA: Diagnosis not present

## 2023-03-17 ENCOUNTER — Other Ambulatory Visit: Payer: Self-pay | Admitting: Family Medicine

## 2023-04-12 DIAGNOSIS — G4733 Obstructive sleep apnea (adult) (pediatric): Secondary | ICD-10-CM | POA: Diagnosis not present

## 2023-04-25 ENCOUNTER — Other Ambulatory Visit: Payer: Self-pay | Admitting: Family Medicine

## 2023-05-31 ENCOUNTER — Telehealth: Payer: Self-pay | Admitting: Family Medicine

## 2023-06-11 ENCOUNTER — Telehealth: Payer: Self-pay | Admitting: Family Medicine

## 2023-06-11 ENCOUNTER — Ambulatory Visit: Payer: Self-pay | Admitting: Family Medicine

## 2023-06-11 NOTE — Telephone Encounter (Signed)
Called patient left message to call office. Sending my chart message to let patient know advised to be seen at urgent care.

## 2023-06-11 NOTE — Telephone Encounter (Addendum)
Please call patient.  Does not appear he was triaged well by E2C2.  Appears he refused ER but was not scheduled at urgent care or given other advice.  Patient probably needs to be seen at urgent care given our office is closing.    Correction, triage has left for the day.  I contacted  patient to discuss symptoms.  Per patient told by E2C2 that I would call him back in an hour  I did not see note until 4:50PM. Not flagged.  Patient reports 24 hours of nausea and vomiting, diarrhea followed by abdominal cramping.  He states that he has kept down fluids since earlier this morning.  He has had no further vomiting or diarrhea.  No fever.  Minimal abdominal cramping. Of note he has had several days to a week of nasal congestion and cough. No known exposure to flu. He has diabetes, has not been checking blood sugars. Most likely viral gastroenteritis and diabetic. Recommended COVID/flu home testing, continued hydration.  If he does not continue to improve rapidly as he already has he will call to make an urgent care appointment tomorrow.  Patient is agreeable. I also encouraged him to check blood sugars regularly given decreased p.o. intake and soda intake. No red flags or need for urgent ER visit.

## 2023-06-11 NOTE — Telephone Encounter (Signed)
incorrectly entered. please see triage from 06/11/23

## 2023-06-11 NOTE — Telephone Encounter (Signed)
Copied from CRM [id number]. Topic: Clinical - Pink Word Triage >> Jun 11, 2023 10:21 AM [name] wrote: Reason for Triage: nausea,vomiting      Chief Complaint: vomiting Symptoms: vomiting, diarrhea, congestion Frequency: Began yesterday Pertinent Negatives: Patient denies CP, SOB, fever, headache Disposition: [x] ED /[] Urgent Care (no appt availability in office) / [] Appointment(In office/virtual)/ []  [name]/ [] Home Care/ [x] Refused Recommended Disposition /[] [facility]/ []  Follow-up with PCP Additional Notes: Patient calls reporting vomiting and diarrhea that began last night. States he has vomited three times in the last 24 hours and had diarrhea this morning during an episode of vomiting. States he has not eaten much, and has thrown up ginger ale. States he has been taking tylenol OTC as well. Per protocol, patient to present to ED now for evaluation based of symptoms. Care advice reviewed, patient declines ED dispo, requesting call back from provider or to be seen in office. Notified Amaya of refusal at [facility]. Alerting PCP for review and follow up.    Reason for Disposition  [1] MODERATE vomiting (e.g., 3 - 5 times/day) AND [2] age > 60 years  Answer Assessment - Initial Assessment Questions 1. VOMITING SEVERITY: "How many times have you vomited in the past 24 hours?"     - MILD:  1 - 2 times/day    - MODERATE: 3 - 5 times/day, decreased oral intake without significant weight loss or symptoms of dehydration    - SEVERE: 6 or more times/day, vomits everything or nearly everything, with significant weight loss, symptoms of dehydration      3 times, moderate 2. ONSET: "When did the vomiting begin?"      Started last night 3. FLUIDS: "What fluids or food have you vomited up today?" "Have you been able to keep any fluids down?"     States he has been drinking ginger ale, but has thrown up since drinking.  4. ABDOMEN PAIN: "Are your having any abdomen pain?" If Yes :  "How bad is it and what does it feel like?" (e.g., crampy, dull, intermittent, constant)      States it was cramping when vomiting, but that has resolved. 5. DIARRHEA: "Is there any diarrhea?" If Yes, ask: "How many times today?"      This morning, vomiting and diarrhea at the same time. 6. CONTACTS: "Is there anyone else in the family with the same symptoms?"      Wife was sick approx a few days ago 7. CAUSE: "What do you think is causing your vomiting?"     Flu 8. HYDRATION STATUS: "Any signs of dehydration?" (e.g., dry mouth [not only dry lips], too weak to stand) "When did you last urinate?"     Denies 9. OTHER SYMPTOMS: "Do you have any other symptoms?" (e.g., fever, headache, vertigo, vomiting blood or coffee grounds, recent head injury)     Congestion, sinus pressure prior to this complaint.  Protocols used: Vomiting-A-AH

## 2023-06-11 NOTE — Telephone Encounter (Signed)
E2C2 nurse called to let office know that the pt declining to visit the ED for nausea & vomiting. See triage note for more info

## 2023-06-11 NOTE — Telephone Encounter (Unsigned)
Copied from CRM [id number]. Topic: Clinical - Pink Word Triage >> Jun 11, 2023 10:21 AM [name] wrote: Reason for Triage: nausea,vomiting

## 2023-06-14 ENCOUNTER — Telehealth: Payer: Self-pay

## 2023-06-14 NOTE — Telephone Encounter (Signed)
See phone note from 06/11/2023.

## 2023-06-14 NOTE — Telephone Encounter (Signed)
I spoke with pt;pt did speak with Dr Ermalene Searing on 06/11/23.Pt did not go to UC over the weekend. Pt said N&V&D stopped on 06/11/23. Pt is able to eat and drink without any abd pain or fever. Pt said he has not been cking BS ands pt is aware Dr  Ermalene Searing wants him to ck his BS. Pt said he does not eed appt now.Pt said would keep FU appt already scheduled 07/20/23 at 11 AM. UC & ED precautions given ands pt voiced understanding. Sending fYI to Dr Ermalene Searing.

## 2023-06-14 NOTE — Telephone Encounter (Signed)
Copied from CRM [id number]. Topic: General - Other >> Jun 14, 2023  3:20 PM Turkey A wrote: Reason for CRM: Patient returned call fro, Nurse. Agent called [facility] and warm transfer to nurse

## 2023-06-14 NOTE — Telephone Encounter (Signed)
Unable to reach pt or pts wife (DPR signed by phone) left v/m requesting pt to call [phone].  Sending note to [facility] triage.

## 2023-06-15 NOTE — Telephone Encounter (Signed)
 Noted

## 2023-06-18 ENCOUNTER — Encounter: Payer: Self-pay | Admitting: Internal Medicine

## 2023-07-13 ENCOUNTER — Telehealth: Payer: Self-pay | Admitting: Family Medicine

## 2023-07-13 DIAGNOSIS — E1159 Type 2 diabetes mellitus with other circulatory complications: Secondary | ICD-10-CM

## 2023-07-13 NOTE — Telephone Encounter (Signed)
 We will recheck microalbumin at patient's upcoming office visit on July 20, 2023.

## 2023-07-13 NOTE — Telephone Encounter (Signed)
 MyChart message sent to patient regarding microalbumin

## 2023-07-15 ENCOUNTER — Ambulatory Visit: Admitting: Internal Medicine

## 2023-07-20 ENCOUNTER — Ambulatory Visit: Payer: Medicare PPO | Admitting: Family Medicine

## 2023-07-23 ENCOUNTER — Ambulatory Visit (INDEPENDENT_AMBULATORY_CARE_PROVIDER_SITE_OTHER): Admitting: Family Medicine

## 2023-07-23 ENCOUNTER — Encounter: Payer: Self-pay | Admitting: Family Medicine

## 2023-07-23 VITALS — BP 138/82 | HR 77 | Temp 97.0°F | Ht 71.0 in | Wt 298.2 lb

## 2023-07-23 DIAGNOSIS — E1159 Type 2 diabetes mellitus with other circulatory complications: Secondary | ICD-10-CM | POA: Diagnosis not present

## 2023-07-23 DIAGNOSIS — I152 Hypertension secondary to endocrine disorders: Secondary | ICD-10-CM | POA: Diagnosis not present

## 2023-07-23 LAB — POCT GLYCOSYLATED HEMOGLOBIN (HGB A1C): Hemoglobin A1C: 6 % — AB (ref 4.0–5.6)

## 2023-07-23 MED ORDER — TADALAFIL 5 MG PO TABS: 5.0000 mg | ORAL_TABLET | Freq: Every day | ORAL | 3 refills | Status: DC

## 2023-07-23 MED ORDER — SEMAGLUTIDE (2 MG/DOSE) 8 MG/3ML ~~LOC~~ SOPN: 2.0000 mg | PEN_INJECTOR | SUBCUTANEOUS | 11 refills | Status: AC

## 2023-07-23 MED ORDER — LISINOPRIL-HYDROCHLOROTHIAZIDE 20-12.5 MG PO TABS: 1.0000 | ORAL_TABLET | Freq: Two times a day (BID) | ORAL | 11 refills | Status: AC

## 2023-07-23 NOTE — Progress Notes (Signed)
 Patient ID: Carlos Day, male    DOB: 1956-04-10, 68 y.o.   MRN: [id number]  This visit was conducted in person.  BP 138/82 (BP Location: Left Arm, Patient Position: Sitting, Cuff Size: Large)   Pulse 77   Temp (!) 97 F (36.1 C) (Temporal)   Ht 5\' 11"  (1.803 m)   Wt 298 lb 4 oz (135.3 kg)   SpO2 99%   BMI 41.60 kg/m    CC:  Chief Complaint  Patient presents with   Diabetes    Subjective:   HPI: Carlos Day is a 68 y.o. male presenting on 07/23/2023 for Diabetes  Diabetes: Well-controlled on  semaglutide 1 mg p.o. weekly. Lab Results  Component Value Date   HGBA1C 6.0 (A) 07/23/2023  Using medications without difficulties: Hypoglycemic episodes: Hyperglycemic episodes: Feet problems: no ulcer Blood Sugars averaging:  not checking eye exam within last year: yes  Wt Readings from Last 3 Encounters:  07/23/23 298 lb 4 oz (135.3 kg)  01/18/23 293 lb (132.9 kg)  11/06/22 290 lb (131.5 kg)  Body mass index is 41.6 kg/m.  Hypertension:  Well controlled  On lisinopril hydrochlorothiazide 20/12.5 mg 2 tablets p.o. daily BP Readings from Last 3 Encounters:  07/23/23 138/82  01/18/23 (!) 142/80  11/06/22 138/84  Using medication without problems or lightheadedness:  none Chest pain with exertion:none Edema: none Short of breath: none Average home BPs: Other issues:   Elevated Cholesterol: LDL at goal less than 100 last check on atorvastatin 10 mg daily Using medications without problems: Muscle aches:  Diet compliance:  eating healthy, lower carb Exercise:  walking more Other complaints:     Relevant past medical, surgical, family and social history reviewed and updated as indicated. Interim medical history since our last visit reviewed. Allergies and medications reviewed and updated. Outpatient Medications Prior to Visit  Medication Sig Dispense Refill   albuterol (PROVENTIL) (2.5 MG/3ML) 0.083% nebulizer solution USE THREE MILLILITERS VIA NEBULIZATION BY  MOUTH EVERY 6 HOURS AS NEEDED FOR WHEEZING OR FOR SHORTNESS OF BREATH 75 mL 6   albuterol (VENTOLIN HFA) 108 (90 Base) MCG/ACT inhaler Inhale 1-2 puffs into the lungs every 6 (six) hours as needed for wheezing or shortness of breath. 18 g 3   APPLE CIDER VINEGAR PO Take 1 tablet by mouth 2 (two) times daily.     aspirin 81 MG tablet Take 81 mg by mouth daily.     atorvastatin (LIPITOR) 10 MG tablet TAKE 1 TABLET BY MOUTH DAILY 90 tablet 3   diclofenac (VOLTAREN) 75 MG EC tablet Take 1 tablet (75 mg total) by mouth 2 (two) times daily. 30 tablet 0   fluticasone-salmeterol (ADVAIR DISKUS) 100-50 MCG/ACT AEPB INHALE 1 PUFF BY MOUTH TWICE A DAY 60 each 5   Insulin Pen Needle (PEN NEEDLES) 30G X 8 MM MISC Inject 0.75 mg into the skin once a week. For trulicity weekly 100 each 11   Multiple Vitamins-Minerals (MULTIVITAMIN WITH MINERALS) tablet Take 1 tablet by mouth daily.     Nebulizers (COMPRESSOR/NEBULIZER) MISC Use as directed 1 each 0   sildenafil (VIAGRA) 100 MG tablet Take 1 tablet (100 mg total) by mouth daily as needed for erectile dysfunction. 10 tablet 0   lisinopril-hydrochlorothiazide (ZESTORETIC) 20-12.5 MG tablet TAKE 1 TABLET BY MOUTH 2 TIMES A DAY 180 tablet 0   Semaglutide, 1 MG/DOSE, 4 MG/3ML SOPN Inject 1 mg as directed once a week. 3 mL 11   No facility-administered medications prior to visit.  Per HPI unless specifically indicated in ROS section below Review of Systems  Constitutional:  Negative for fatigue and fever.  HENT:  Negative for ear pain.   Eyes:  Negative for pain.  Respiratory:  Negative for cough and shortness of breath.   Cardiovascular:  Negative for chest pain, palpitations and leg swelling.  Gastrointestinal:  Negative for abdominal pain.  Genitourinary:  Positive for frequency. Negative for dysuria.  Musculoskeletal:  Negative for arthralgias.  Neurological:  Negative for syncope, light-headedness and headaches.  Psychiatric/Behavioral:  Negative for  dysphoric mood.    Objective:  BP 138/82 (BP Location: Left Arm, Patient Position: Sitting, Cuff Size: Large)   Pulse 77   Temp (!) 97 F (36.1 C) (Temporal)   Ht 5\' 11"  (1.803 m)   Wt 298 lb 4 oz (135.3 kg)   SpO2 99%   BMI 41.60 kg/m   Wt Readings from Last 3 Encounters:  07/23/23 298 lb 4 oz (135.3 kg)  01/18/23 293 lb (132.9 kg)  11/06/22 290 lb (131.5 kg)      Physical Exam Constitutional:      Appearance: He is well-developed.  HENT:     Head: Normocephalic.     Right Ear: Hearing normal.     Left Ear: Hearing normal.     Nose: Nose normal.  Neck:     Thyroid: No thyroid mass or thyromegaly.     Vascular: No carotid bruit.     Trachea: Trachea normal.  Cardiovascular:     Rate and Rhythm: Normal rate and regular rhythm.     Pulses: Normal pulses.     Heart sounds: Heart sounds not distant. No murmur heard.    No friction rub. No gallop.     Comments: No peripheral edema Pulmonary:     Effort: Pulmonary effort is normal. No respiratory distress.     Breath sounds: Normal breath sounds.  Skin:    General: Skin is warm and dry.     Findings: No rash.  Psychiatric:        Speech: Speech normal.        Behavior: Behavior normal.        Thought Content: Thought content normal.         Results for orders placed or performed in visit on 07/23/23  POCT glycosylated hemoglobin (Hb A1C)   Collection Time: 07/23/23  4:15 PM  Result Value Ref Range   Hemoglobin A1C 6.0 (A) 4.0 - 5.6 %   HbA1c POC (<> result, manual entry)     HbA1c, POC (prediabetic range)     HbA1c, POC (controlled diabetic range)       COVID 19 screen:  No recent travel or known exposure to COVID19 The patient denies respiratory symptoms of COVID 19 at this time. The importance of social distancing was discussed today.   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  On semaglutide 1 mg weekly  Minimal weight loss associated... will try trial of increased dose to 2mg  weekly. Discussed correct administration technique and it appears he is giving it to himself correctly.  Encouraged exercise, weight loss, healthy eating habits.       Relevant Medications   Semaglutide, 2 MG/DOSE, 8 MG/3ML SOPN   lisinopril-hydrochlorothiazide (ZESTORETIC) 20-12.5 MG tablet   tadalafil (CIALIS) 5 MG tablet   Other Relevant Orders  POCT glycosylated hemoglobin (Hb A1C) (Completed)   Hypertension associated with diabetes (HCC) (Chronic)   Well controlled   On lisinopril hydrochlorothiazide 20/12.5 mg 2 tablets p.o. daily      Relevant Medications   Semaglutide, 2 MG/DOSE, 8 MG/3ML SOPN   lisinopril-hydrochlorothiazide (ZESTORETIC) 20-12.5 MG tablet   tadalafil (CIALIS) 5 MG tablet      Kerby Nora, MD

## 2023-07-23 NOTE — Assessment & Plan Note (Signed)
Well controlled   On lisinopril hydrochlorothiazide 20/12.5 mg 2 tablets p.o. daily

## 2023-07-23 NOTE — Assessment & Plan Note (Addendum)
 Chronic,good control...  On semaglutide 1 mg weekly  Minimal weight loss associated... will try trial of increased dose to 2mg  weekly. Discussed correct administration technique and it appears he is giving it to himself correctly.  Encouraged exercise, weight loss, healthy eating habits.

## 2023-08-13 ENCOUNTER — Other Ambulatory Visit: Payer: Self-pay | Admitting: Family Medicine

## 2023-08-17 ENCOUNTER — Other Ambulatory Visit: Payer: Self-pay | Admitting: Internal Medicine

## 2023-10-25 ENCOUNTER — Telehealth: Payer: Self-pay | Admitting: Internal Medicine

## 2023-10-25 NOTE — Telephone Encounter (Signed)
 PT is out of Advair. [facility] [place]. Pharmacy denied.

## 2023-10-25 NOTE — Telephone Encounter (Signed)
 Copied from CRM [id number]. Topic: Appointments - Appointment Cancel/Reschedule >> Oct 22, 2023  2:45 PM Carlos Day wrote: Patient is calling to reschedule an appointment. Patient stated he received a phone call on last week regarding rescheduling his July 7th appt due to Dr.young not being the office, no documentation via MyChart, left current appointment as is. Please follow up with patient regarding this, patient would like to know is his appointment needing to be rescheduled for July 7th with Dr.Young or will Dr.Young be in office and available. Please follow up. Thank you.

## 2023-10-26 NOTE — Telephone Encounter (Signed)
 Patient needs to reschedule 7/7 appointment- left voicemail for patient to call back- canceled appointment.

## 2023-10-26 NOTE — Telephone Encounter (Signed)
 LM for PT to call us  back to resched. Sent [facility]

## 2023-10-27 ENCOUNTER — Telehealth: Payer: Self-pay | Admitting: Internal Medicine

## 2023-10-27 MED ORDER — FLUTICASONE-SALMETEROL 100-50 MCG/ACT IN AEPB: INHALATION_SPRAY | RESPIRATORY_TRACT | 1 refills | Status: DC

## 2023-10-27 NOTE — Telephone Encounter (Signed)
 Patient has been scheduled

## 2023-10-27 NOTE — Telephone Encounter (Signed)
 OK to send refill in on Advair.  Sent in rx.  Notified patient.

## 2023-10-27 NOTE — Telephone Encounter (Signed)
 Patient needs a refill of his Advair inhaler. He had an appointment with Dr.Young on July 7th but the office will be closed and had to rescheduled for the end of August. Can he get a refill to last him until his appointment on August 28th? Please advise.    Pharmacy: [facility] [address]

## 2023-11-01 ENCOUNTER — Ambulatory Visit: Admitting: Internal Medicine

## 2023-12-07 ENCOUNTER — Other Ambulatory Visit: Payer: Self-pay | Admitting: Family Medicine

## 2023-12-07 NOTE — Telephone Encounter (Signed)
 Please call and schedule Medicare wellness with Erminio and CPE with fasting labs prior with Dr. Avelina for after 01/18/2024

## 2023-12-07 NOTE — Telephone Encounter (Signed)
 Lvmtcb, sent mychart message

## 2023-12-08 NOTE — Telephone Encounter (Signed)
 Spoke to pt, sch awv telephone for tomorrow, 8/14 & cpe for 01/21/24. Pt requested same day labs

## 2023-12-09 ENCOUNTER — Ambulatory Visit (INDEPENDENT_AMBULATORY_CARE_PROVIDER_SITE_OTHER)

## 2023-12-09 VITALS — BP 138/82 | Ht 71.0 in | Wt 280.0 lb

## 2023-12-09 DIAGNOSIS — Z2821 Immunization not carried out because of patient refusal: Secondary | ICD-10-CM

## 2023-12-09 DIAGNOSIS — Z0000 Encounter for general adult medical examination without abnormal findings: Secondary | ICD-10-CM

## 2023-12-09 NOTE — Patient Instructions (Signed)
 Carlos Day , Thank you for taking time out of your busy schedule to complete your Annual Wellness Visit with me. I enjoyed our conversation and look forward to speaking with you again next year. I, as well as your care team,  appreciate your ongoing commitment to your health goals. Please review the following plan we discussed and let me know if I can assist you in the future. Your Game plan/ To Do List    Referrals: If you haven't heard from the office you've been referred to, please reach out to them at the phone provided.   Follow up Visits: We will see or speak with you next year for your Next Medicare AWV with our clinical staff Have you seen your provider in the last 6 months (3 months if uncontrolled diabetes)? No  Clinician Recommendations:  Aim for 30 minutes of exercise or brisk walking, 6-8 glasses of water, and 5 servings of fruits and vegetables each day.       This is a list of the screenings recommended for you:  Health Maintenance  Topic Date Due   Yearly kidney health urinalysis for diabetes  Never done   Zoster (Shingles) Vaccine (1 of 2) Never done   COVID-19 Vaccine (2 - 2024-25 season) 12/27/2022   Flu Shot  11/26/2023   Yearly kidney function blood test for diabetes  01/01/2024   Complete foot exam   01/18/2024   Hemoglobin A1C  01/23/2024   Eye exam for diabetics  01/28/2024   Medicare Annual Wellness Visit  12/08/2024   Cologuard (Stool DNA test)  02/15/2026   DTaP/Tdap/Td vaccine (3 - Td or Tdap) 02/17/2027   Pneumococcal Vaccine for age over 52  Completed   Hepatitis C Screening  Completed   HPV Vaccine  Aged Out   Meningitis B Vaccine  Aged Out   Pneumococcal Vaccine  Discontinued   Colon Cancer Screening  Discontinued    Advanced directives: (Declined) Advance directive discussed with you today. Even though you declined this today, please call our office should you change your mind, and we can give you the proper paperwork for you to fill out. Advance  Care Planning is important because it:  [x]  Makes sure you receive the medical care that is consistent with your values, goals, and preferences  [x]  It provides guidance to your family and loved ones and reduces their decisional burden about whether or not they are making the right decisions based on your wishes.  Follow the link provided in your after visit summary or read over the paperwork we have mailed to you to help you started getting your Advance Directives in place. If you need assistance in completing these, please reach out to us  so that we can help you!  See attachments for Preventive Care and Fall Prevention Tips.

## 2023-12-09 NOTE — Progress Notes (Signed)
 Because this visit was a virtual/telehealth visit,  certain criteria was not obtained, such a blood pressure, CBG if applicable, and timed get up and go. Any medications not marked as taking were not mentioned during the medication reconciliation part of the visit. Any vitals not documented were not able to be obtained due to this being a telehealth visit or patient was unable to self-report a recent blood pressure reading due to a lack of equipment at home via telehealth. Vitals that have been documented are verbally provided by the patient.  This visit was performed by a medical professional under my direct supervision. I was immediately available for consultation/collaboration. I have reviewed and agree with the Annual Wellness Visit documentation.  Subjective:   Carlos Day is a 68 y.o. who presents for a Medicare Wellness preventive visit.  As a reminder, Annual Wellness Visits don't include a physical exam, and some assessments may be limited, especially if this visit is performed virtually. We may recommend an in-person follow-up visit with your provider if needed.  Visit Complete: Virtual I connected with  Carlos Day on 12/09/23 by a audio enabled telemedicine application and verified that I am speaking with the correct person using two identifiers.  Patient Location: Home  Provider Location: Home Office  I discussed the limitations of evaluation and management by telemedicine. The patient expressed understanding and agreed to proceed.  Vital Signs: Because this visit was a virtual/telehealth visit, some criteria may be missing or patient reported. Any vitals not documented were not able to be obtained and vitals that have been documented are patient reported.  VideoDeclined- This patient declined Librarian, academic. Therefore the visit was completed with audio only.  Persons Participating in Visit: Patient.  AWV Questionnaire: No: Patient Medicare AWV  questionnaire was not completed prior to this visit.  Cardiac Risk Factors include: advanced age (>71men, >24 women);male gender;diabetes mellitus;obesity (BMI >30kg/m2);dyslipidemia;hypertension     Objective:    Today's Vitals   12/09/23 0954  BP: 138/82  Weight: 280 lb (127 kg)  Height: 5' 11 (1.803 m)   Body mass index is 39.05 kg/m.     12/09/2023    9:59 AM  Advanced Directives  Does Patient Have a Medical Advance Directive? No  Would patient like information on creating a medical advance directive? No - Patient declined    Current Medications (verified) Outpatient Encounter Medications as of 12/09/2023  Medication Sig   albuterol  (PROVENTIL ) (2.5 MG/3ML) 0.083% nebulizer solution USE THREE MILLILITERS VIA NEBULIZATION BY MOUTH EVERY 6 HOURS AS NEEDED FOR WHEEZING OR FOR SHORTNESS OF BREATH   albuterol  (VENTOLIN  HFA) 108 (90 Base) MCG/ACT inhaler INHALE ONE TO TWO PUFFS BY MOUTH EVERY 6 HOURS AS NEEDED FOR WHEEZING OR FOR SHORTNESS OF BREATH   APPLE CIDER VINEGAR PO Take 1 tablet by mouth 2 (two) times daily.   aspirin 81 MG tablet Take 81 mg by mouth daily.   atorvastatin  (LIPITOR) 10 MG tablet TAKE 1 TABLET BY MOUTH DAILY   diclofenac  (VOLTAREN ) 75 MG EC tablet Take 1 tablet (75 mg total) by mouth 2 (two) times daily.   fluticasone -salmeterol (ADVAIR DISKUS) 100-50 MCG/ACT AEPB INHALE 1 PUFF BY MOUTH TWICE A DAY   Insulin Pen Needle (PEN NEEDLES) 30G X 8 MM MISC Inject 0.75 mg into the skin once a week. For trulicity  weekly   lisinopril -hydrochlorothiazide  (ZESTORETIC ) 20-12.5 MG tablet Take 1 tablet by mouth 2 (two) times daily.   Multiple Vitamins-Minerals (MULTIVITAMIN WITH MINERALS) tablet Take 1 tablet  by mouth daily.   Nebulizers (COMPRESSOR/NEBULIZER) MISC Use as directed   Semaglutide , 2 MG/DOSE, 8 MG/3ML SOPN Inject 2 mg as directed once a week.   sildenafil  (VIAGRA ) 100 MG tablet Take 1 tablet (100 mg total) by mouth daily as needed for erectile dysfunction.    tadalafil  (CIALIS ) 5 MG tablet TAKE 1 TABLET BY MOUTH DAILY   No facility-administered encounter medications on file as of 12/09/2023.    Allergies (verified) Patient has no known allergies.   History: Past Medical History:  Diagnosis Date   Asthma    Sleep apnea    Unspecified essential hypertension    Past Surgical History:  Procedure Laterality Date   COLONOSCOPY     Family History  Problem Relation Age of Onset   Breast cancer Mother    Hypertension Brother    Prostate cancer Father    Colon cancer Neg Hx    Esophageal cancer Neg Hx    Rectal cancer Neg Hx    Stomach cancer Neg Hx    Social History   Socioeconomic History   Marital status: Married    Spouse name: Not on file   Number of children: Not on file   Years of education: Not on file   Highest education level: Not on file  Occupational History   Occupation: yard Work  Tobacco Use   Smoking status: Never   Smokeless tobacco: Never  Vaping Use   Vaping status: Never Used  Substance and Sexual Activity   Alcohol use: No    Alcohol/week: 0.0 standard drinks of alcohol   Drug use: No   Sexual activity: Not Currently  Other Topics Concern   Not on file  Social History Narrative   Not on file   Social Drivers of Health   Financial Resource Strain: Low Risk  (12/09/2023)   Overall Financial Resource Strain (CARDIA)    Difficulty of Paying Living Expenses: Not hard at all  Food Insecurity: No Food Insecurity (12/09/2023)   Hunger Vital Sign    Worried About Running Out of Food in the Last Year: Never true    Ran Out of Food in the Last Year: Never true  Transportation Needs: No Transportation Needs (12/09/2023)   PRAPARE - Administrator, Civil Service (Medical): No    Lack of Transportation (Non-Medical): No  Physical Activity: Insufficiently Active (12/09/2023)   Exercise Vital Sign    Days of Exercise per Week: 2 days    Minutes of Exercise per Session: 30 min  Stress: No Stress  Concern Present (12/09/2023)   [employer] of Occupational Health - Occupational Stress Questionnaire    Feeling of Stress: Not at all  Social Connections: Moderately Isolated (12/09/2023)   Social Connection and Isolation Panel    Frequency of Communication with Friends and Family: Once a week    Frequency of Social Gatherings with Friends and Family: Once a week    Attends Religious Services: More than 4 times per year    Active Member of [employer] or Organizations: No    Attends Engineer, structural: Never    Marital Status: Married    Tobacco Counseling Counseling given: Not Answered    Clinical Intake:  Pre-visit preparation completed: Yes  Pain : No/denies pain     BMI - recorded: 39.05 Nutritional Status: BMI > 30  Obese Nutritional Risks: None Diabetes: Yes CBG done?: No Did pt. bring in CBG monitor from home?: No  Lab Results  Component Value Date  HGBA1C 6.0 (A) 07/23/2023   HGBA1C 6.5 01/01/2023   HGBA1C 6.8 (H) 01/06/2022     How often do you need to have someone help you when you read instructions, pamphlets, or other written materials from your doctor or pharmacy?: 1 - Never What is the last grade level you completed in school?: 11th grade  Interpreter Needed?: No  Information entered by :: Brit Wernette,CMA   Activities of Daily Living     12/09/2023    9:58 AM  In your present state of health, do you have any difficulty performing the following activities:  Hearing? 0  Vision? 0  Difficulty concentrating or making decisions? 0  Walking or climbing stairs? 0  Dressing or bathing? 0  Doing errands, shopping? 0  Preparing Food and eating ? N  Using the Toilet? N  In the past six months, have you accidently leaked urine? N  Do you have problems with loss of bowel control? N  Managing your Medications? N  Managing your Finances? N  Housekeeping or managing your Housekeeping? N    Patient Care Team: Avelina Greig BRAVO, MD as PCP -  General Geronimo Manuelita SAUNDERS, [facility] (Pharmacist)  I have updated your Care Teams any recent Medical Services you may have received from other providers in the past year.     Assessment:   This is a routine wellness examination for Carlos Day.  Hearing/Vision screen Hearing Screening - Comments:: No difficulties hearing  Vision Screening - Comments:: No difficulties    Goals Addressed             This Visit's Progress    Patient Stated       Getting closer to god        Depression Screen     12/09/2023    9:59 AM 01/18/2023   12:24 PM 03/31/2022    3:59 PM 01/13/2022    3:07 PM 09/17/2020    8:56 AM 05/16/2019    2:04 PM 04/26/2018    9:27 AM  PHQ 2/9 Scores  PHQ - 2 Score 2 0 0 0 0 0 1  PHQ- 9 Score 4          Fall Risk     12/09/2023    9:58 AM 01/18/2023   12:24 PM 03/31/2022    3:59 PM 01/13/2022    3:07 PM  Fall Risk   Falls in the past year? 0 0 1 0  Number falls in past yr: 0 0 0   Injury with Fall? 0 0 1   Comment   left shoulder   Risk for fall due to : No Fall Risks No Fall Risks    Follow up Falls evaluation completed Falls evaluation completed Falls evaluation completed  Falls evaluation completed      Data saved with a previous flowsheet row definition    MEDICARE RISK AT HOME:  Medicare Risk at Home Any stairs in or around the home?: No If so, are there any without handrails?: No Home free of loose throw rugs in walkways, pet beds, electrical cords, etc?: Yes Adequate lighting in your home to reduce risk of falls?: Yes Life alert?: No Use of a cane, walker or w/c?: No Grab bars in the bathroom?: No Shower chair or bench in shower?: No Elevated toilet seat or a handicapped toilet?: No  TIMED UP AND GO:  Was the test performed?  No  Cognitive Function: 6CIT completed        12/09/2023  9:57 AM  6CIT Screen  What Year? 0 points  What month? 0 points  What time? 0 points  Count back from 20 0 points  Months in reverse 0 points  Repeat phrase  0 points  Total Score 0 points    Immunizations Immunization History  Administered Date(s) Administered   Influenza,inj,Quad PF,6+ Mos 01/20/2019, 03/15/2020   Janssen (J&J) SARS-COV-2 Vaccination 08/07/2019   PNEUMOCOCCAL CONJUGATE-20 01/13/2022   Pneumococcal Polysaccharide-23 11/17/2016   Td 01/17/2007   Tdap 02/16/2017    Screening Tests Health Maintenance  Topic Date Due   Diabetic kidney evaluation - Urine ACR  Never done   Zoster Vaccines- Shingrix (1 of 2) Never done   COVID-19 Vaccine (2 - 2024-25 season) 12/27/2022   INFLUENZA VACCINE  11/26/2023   Diabetic kidney evaluation - eGFR measurement  01/01/2024   FOOT EXAM  01/18/2024   HEMOGLOBIN A1C  01/23/2024   OPHTHALMOLOGY EXAM  01/28/2024   Medicare Annual Wellness (AWV)  12/08/2024   Fecal DNA (Cologuard)  02/15/2026   DTaP/Tdap/Td (3 - Td or Tdap) 02/17/2027   Pneumococcal Vaccine: 50+ Years  Completed   Hepatitis C Screening  Completed   HPV VACCINES  Aged Out   Meningococcal B Vaccine  Aged Out   Pneumococcal Vaccine  Discontinued   Colonoscopy  Discontinued    Health Maintenance  Health Maintenance Due  Topic Date Due   Diabetic kidney evaluation - Urine ACR  Never done   Zoster Vaccines- Shingrix (1 of 2) Never done   COVID-19 Vaccine (2 - 2024-25 season) 12/27/2022   INFLUENZA VACCINE  11/26/2023   Diabetic kidney evaluation - eGFR measurement  01/01/2024   Health Maintenance Items Addressed:   Additional Screening:  Vision Screening: Recommended annual ophthalmology exams for early detection of glaucoma and other disorders of the eye. Would you like a referral to an eye doctor? No    Dental Screening: Recommended annual dental exams for proper oral hygiene  Community Resource Referral / Chronic Care Management: CRR required this visit?  No   CCM required this visit?  No   Plan:    I have personally reviewed and noted the following in the patient's chart:   Medical and social  history Use of alcohol, tobacco or illicit drugs  Current medications and supplements including opioid prescriptions. Patient is not currently taking opioid prescriptions. Functional ability and status Nutritional status Physical activity Advanced directives List of other physicians Hospitalizations, surgeries, and ER visits in previous 12 months Vitals Screenings to include cognitive, depression, and falls Referrals and appointments  In addition, I have reviewed and discussed with patient certain preventive protocols, quality metrics, and best practice recommendations. A written personalized care plan for preventive services as well as general preventive health recommendations were provided to patient.   Carlos Day, [place]   12/09/2023   After Visit Summary: (MyChart) Due to this being a telephonic visit, the after visit summary with patients personalized plan was offered to patient via MyChart   Notes: Nothing significant to report at this time.

## 2023-12-20 NOTE — Progress Notes (Signed)
 Subjective:    Patient ID: Carlos Day, male    DOB: 1955-07-24, 68 y.o.   MRN: [id number]  HPI male never smoker followed for OSA, asthma, complicated by HBP NPSG 07/24/04  AHI 99.3/ hr, desaturation to 72%,    Body weight 260 lbs Office Spirometry 11/01/16-moderately severe obstructive airways disease with restriction of exhaled volume. FVC 2.96/60%, FEV1 2.14/57%, ratio 0.7 to, FEF 25-75% 1.50/49% -----------------------------------------------------------------------    11/06/22- 68 year old male never smoker followed for OSA, Asthma mocderate persistent, complicated by HBP - neb Duoneb, Ventolin  hfa, Advair 100,  Prednisone  taper 5/22 from PCP CPAP 11/ [facility]    Luna G3 Auto    Download- compliance ([facility])- 73%, AHI 1.7/ hr Body weight today- 290 lbs LOV 4/4/24GLENWOOD Ferrari, NP- ordered replacement for old CPAP 11 cwp Download reviewed. Doing fine with CPAP. Asthma control doing better. Advair helps. Occasional nebulizer.   12/23/23- 68 year old male never smoker followed for OSA, Asthma mocderate persistent, complicated by HBP - neb Duoneb, Ventolin  hfa, Advair 100,  Prednisone  taper 5/22 from PCP ACT score 20 CPAP 11/ [facility]    Luna G3 Auto    Download- compliance ([employer])-  Body weight today- 283 lbs -----Doing better with new CPAP.  Has had for about 6 months now.  DME is [facility].  Likes new mask.    ROS-see HPI + = positive Constitutional:   + weight loss, night sweats, fevers, chills, fatigue, lassitude. HEENT:   No-  headaches, difficulty swallowing, tooth/dental problems, sore throat,       No-  sneezing, itching, ear ache, nasal congestion, post nasal drip,  CV:  No-   chest pain, orthopnea, PND, swelling in lower extremities, anasarca, dizziness, palpitations Resp: +   shortness of breath with exertion or at rest.              No-   productive cough,  No non-productive cough,  No- coughing up of blood.              No-   change in color of mucus.  +little wheezing.    Skin: No-   rash or lesions. GI:  No-   heartburn, indigestion, abdominal pain, nausea, vomiting,  GU: . MS:  No-   joint pain or swelling. . Neuro-     nothing unusual Psych:  No- change in mood or affect. No depression or anxiety.  No memory loss.  OBJ- Physical Exam General- Alert, Oriented, Affect-appropriate, Distress- none acute, + overweight/ muscular Skin- rash-none, lesions- none, excoriation- none Lymphadenopathy- none Head- atraumatic            Eyes- Gross vision intact, PERRLA, conjunctivae and secretions clear            Ears- Hearing, canals-normal            Nose- Clear, no-Septal dev, mucus, polyps, erosion, perforation             Throat- Mallampati III , mucosa clear , drainage- none, tonsils- atrophic Neck- flexible , trachea midline, no stridor , thyroid nl, carotid no bruit Chest - symmetrical excursion , unlabored           Heart/CV- RRR , no murmur , no gallop  , no rub, nl s1 s2                           - JVD- none , edema- none, stasis changes- none, varices- none  Lung- + clear, cough- none , dullness-none, rub- none           Chest wall-  Abd-  Br/ Gen/ Rectal- Not done, not indicated Extrem- cyanosis- none, clubbing, none, atrophy- none, strength- nl Neuro- grossly intact to observation

## 2023-12-23 ENCOUNTER — Encounter: Payer: Self-pay | Admitting: Internal Medicine

## 2023-12-23 ENCOUNTER — Ambulatory Visit (INDEPENDENT_AMBULATORY_CARE_PROVIDER_SITE_OTHER): Admitting: Internal Medicine

## 2023-12-23 VITALS — BP 138/84 | HR 54 | Temp 98.2°F | Ht 71.0 in | Wt 283.0 lb

## 2023-12-23 DIAGNOSIS — J4540 Moderate persistent asthma, uncomplicated: Secondary | ICD-10-CM | POA: Diagnosis not present

## 2023-12-23 NOTE — Patient Instructions (Signed)
 We can keep on with CPAP  Ok to continue your asthma meds  Please let us  know if we can help

## 2024-01-21 ENCOUNTER — Encounter: Payer: Self-pay | Admitting: Family Medicine

## 2024-01-21 ENCOUNTER — Ambulatory Visit (INDEPENDENT_AMBULATORY_CARE_PROVIDER_SITE_OTHER): Admitting: Family Medicine

## 2024-01-21 VITALS — BP 154/90 | HR 55 | Temp 97.1°F | Ht 69.5 in | Wt 282.5 lb

## 2024-01-21 DIAGNOSIS — Z125 Encounter for screening for malignant neoplasm of prostate: Secondary | ICD-10-CM | POA: Diagnosis not present

## 2024-01-21 DIAGNOSIS — E1159 Type 2 diabetes mellitus with other circulatory complications: Secondary | ICD-10-CM | POA: Diagnosis not present

## 2024-01-21 DIAGNOSIS — G4733 Obstructive sleep apnea (adult) (pediatric): Secondary | ICD-10-CM | POA: Diagnosis not present

## 2024-01-21 DIAGNOSIS — I152 Hypertension secondary to endocrine disorders: Secondary | ICD-10-CM | POA: Diagnosis not present

## 2024-01-21 DIAGNOSIS — J4540 Moderate persistent asthma, uncomplicated: Secondary | ICD-10-CM

## 2024-01-21 DIAGNOSIS — Z0000 Encounter for general adult medical examination without abnormal findings: Secondary | ICD-10-CM

## 2024-01-21 DIAGNOSIS — E785 Hyperlipidemia, unspecified: Secondary | ICD-10-CM

## 2024-01-21 DIAGNOSIS — E1169 Type 2 diabetes mellitus with other specified complication: Secondary | ICD-10-CM | POA: Diagnosis not present

## 2024-01-21 LAB — COMPREHENSIVE METABOLIC PANEL WITH GFR
ALT: 32 U/L (ref 0–53)
AST: 23 U/L (ref 0–37)
Albumin: 4.5 g/dL (ref 3.5–5.2)
Alkaline Phosphatase: 61 U/L (ref 39–117)
BUN: 13 mg/dL (ref 6–23)
CO2: 31 meq/L (ref 19–32)
Calcium: 9.9 mg/dL (ref 8.4–10.5)
Chloride: 101 meq/L (ref 96–112)
Creatinine, Ser: 0.95 mg/dL (ref 0.40–1.50)
GFR: 82.82 mL/min (ref >60.00)
Glucose, Bld: 96 mg/dL (ref 70–99)
Potassium: 3.7 meq/L (ref 3.5–5.1)
Sodium: 139 meq/L (ref 135–145)
Total Bilirubin: 0.8 mg/dL (ref 0.2–1.2)
Total Protein: 7 g/dL (ref 6.0–8.3)

## 2024-01-21 LAB — MICROALBUMIN / CREATININE URINE RATIO
Creatinine,U: 147.5 mg/dL
Microalb Creat Ratio: 24.5 mg/g (ref 0.0–30.0)
Microalb, Ur: 3.6 mg/dL — ABNORMAL HIGH (ref 0.0–1.9)

## 2024-01-21 LAB — HEMOGLOBIN A1C: Hgb A1c MFr Bld: 6.4 % (ref 4.6–6.5)

## 2024-01-21 LAB — LIPID PANEL
Cholesterol: 123 mg/dL (ref 0–200)
HDL: 39.8 mg/dL (ref >39.00)
LDL Cholesterol: 67 mg/dL (ref 0–99)
NonHDL: 83.18
Total CHOL/HDL Ratio: 3
Triglycerides: 82 mg/dL (ref 0.0–149.0)
VLDL: 16.4 mg/dL (ref 0.0–40.0)

## 2024-01-21 LAB — HM DIABETES FOOT EXAM

## 2024-01-21 LAB — PSA, MEDICARE: PSA: 0.71 ng/mL (ref 0.10–4.00)

## 2024-01-21 MED ORDER — TADALAFIL 5 MG PO TABS: 5.0000 mg | ORAL_TABLET | Freq: Every day | ORAL | 3 refills | Status: DC

## 2024-01-21 NOTE — Assessment & Plan Note (Signed)
Chronic, improved control back on Advair 100/50 twice daily.  follow-up with pulmonology.

## 2024-01-21 NOTE — Patient Instructions (Addendum)
 Look into getting Shingrix ( series of 2), RSV.SABRA one dose, COVID 2025-2026 formulation at pharmacy. Follow BP at home.. call if running > 140/90 consistently for more than 3 measurements.

## 2024-01-21 NOTE — Progress Notes (Signed)
 Patient ID: Carlos Day, male    DOB: 1956/04/09, 68 y.o.   MRN: [id number]  This visit was conducted in person.  BP (!) 140/86   Pulse (!) 55   Temp (!) 97.1 F (36.2 C) (Temporal)   Ht 5' 9.5 (1.765 m)   Wt 282 lb 8 oz (128.1 kg)   SpO2 99%   BMI 41.12 kg/m    CC:  Chief Complaint  Patient presents with   Annual Exam    M[facility] 12/09/2023    Subjective:   HPI: Carlos Day is a 68 y.o. male presenting on 01/21/2024 for Annual Exam (MWV 12/09/2023) The patient presents for complete physical and review of chronic health problems. He/She also has the following acute concerns today:   Diabetes: Well-controlled on  semaglutide  2 mg p.o. weekly in the past due for reevaluation. Lab Results  Component Value Date   HGBA1C 6.0 (A) 07/23/2023  Using medications without difficulties: none Hypoglycemic episodes: Hyperglycemic episodes: Feet problems: no ulcer Blood Sugars averaging:  not checking eye exam within last year: yes Unfortunately he has made minimal weight loss. Wt Readings from Last 3 Encounters:  01/21/24 282 lb 8 oz (128.1 kg)  12/23/23 283 lb (128.4 kg)  12/09/23 280 lb (127 kg)  Body mass index is 41.12 kg/m.  Hypertension:  Well controlled in past  on lisinopril  hydrochlorothiazide  20/12.5 mg 2 tablets p.o. daily.  Blood pressure initially elevated, will recheck before patient leaves. BP Readings from Last 3 Encounters:  01/21/24 (!) 140/86  12/23/23 138/84  12/09/23 138/82  Using medication without problems or lightheadedness:  none Chest pain with exertion:none Edema: none Short of breath: none Average home BPs: not checking. Other issues:   Elevated Cholesterol: LDL at goal less than 100 on atorvastatin  10 mg daily in the past, due for reevaluation Lab Results  Component Value Date   CHOL 122 01/01/2023   HDL 42.20 01/01/2023   LDLCALC 62 01/01/2023   LDLDIRECT 125.0 11/17/2016   TRIG 87.0 01/01/2023   CHOLHDL 3 01/01/2023  Using medications  without problems: Muscle aches:  Diet compliance: moderate Exercise:  daily walking in last few months (30 min) Other complaints:  Asthma moderate persistent:  good controll of advair.  Has albuterol  prn.     Cialis  helping with ED.. needs refill.  Relevant past medical, surgical, family and social history reviewed and updated as indicated. Interim medical history since our last visit reviewed. Allergies and medications reviewed and updated. Outpatient Medications Prior to Visit  Medication Sig Dispense Refill   albuterol  (PROVENTIL ) (2.5 MG/3ML) 0.083% nebulizer solution USE THREE MILLILITERS VIA NEBULIZATION BY MOUTH EVERY 6 HOURS AS NEEDED FOR WHEEZING OR FOR SHORTNESS OF BREATH 75 mL 6   albuterol  (VENTOLIN  HFA) 108 (90 Base) MCG/ACT inhaler INHALE ONE TO TWO PUFFS BY MOUTH EVERY 6 HOURS AS NEEDED FOR WHEEZING OR FOR SHORTNESS OF BREATH 8.5 g 1   APPLE CIDER VINEGAR PO Take 1 tablet by mouth 2 (two) times daily.     aspirin 81 MG tablet Take 81 mg by mouth daily.     atorvastatin  (LIPITOR) 10 MG tablet TAKE 1 TABLET BY MOUTH DAILY 90 tablet 3   diclofenac  (VOLTAREN ) 75 MG EC tablet Take 1 tablet (75 mg total) by mouth 2 (two) times daily. 30 tablet 0   fluticasone -salmeterol (ADVAIR DISKUS) 100-50 MCG/ACT AEPB INHALE 1 PUFF BY MOUTH TWICE A DAY 60 each 1   Insulin Pen Needle (PEN NEEDLES) 30G X 8 MM MISC  Inject 0.75 mg into the skin once a week. For trulicity  weekly 100 each 11   lisinopril -hydrochlorothiazide  (ZESTORETIC ) 20-12.5 MG tablet Take 1 tablet by mouth 2 (two) times daily. 60 tablet 11   Multiple Vitamins-Minerals (MULTIVITAMIN WITH MINERALS) tablet Take 1 tablet by mouth daily.     Nebulizers (COMPRESSOR/NEBULIZER) MISC Use as directed 1 each 0   Semaglutide , 2 MG/DOSE, 8 MG/3ML SOPN Inject 2 mg as directed once a week. 3 mL 11   tadalafil  (CIALIS ) 5 MG tablet TAKE 1 TABLET BY MOUTH DAILY 30 tablet 0   No facility-administered medications prior to visit.     Per HPI  unless specifically indicated in ROS section below Review of Systems  Constitutional:  Negative for fatigue and fever.  HENT:  Negative for ear pain.   Eyes:  Negative for pain.  Respiratory:  Negative for cough and shortness of breath.   Cardiovascular:  Negative for chest pain, palpitations and leg swelling.  Gastrointestinal:  Negative for abdominal pain.  Genitourinary:  Positive for frequency. Negative for dysuria.  Musculoskeletal:  Negative for arthralgias.  Neurological:  Negative for syncope, light-headedness and headaches.  Psychiatric/Behavioral:  Negative for dysphoric mood.    Objective:  BP (!) 140/86   Pulse (!) 55   Temp (!) 97.1 F (36.2 C) (Temporal)   Ht 5' 9.5 (1.765 m)   Wt 282 lb 8 oz (128.1 kg)   SpO2 99%   BMI 41.12 kg/m   Wt Readings from Last 3 Encounters:  01/21/24 282 lb 8 oz (128.1 kg)  12/23/23 283 lb (128.4 kg)  12/09/23 280 lb (127 kg)      Physical Exam Constitutional:      Appearance: He is well-developed.  HENT:     Head: Normocephalic.     Right Ear: Hearing normal.     Left Ear: Hearing normal.     Nose: Nose normal.  Neck:     Thyroid: No thyroid mass or thyromegaly.     Vascular: No carotid bruit.     Trachea: Trachea normal.  Cardiovascular:     Rate and Rhythm: Normal rate and regular rhythm.     Pulses: Normal pulses.     Heart sounds: Heart sounds not distant. No murmur heard.    No friction rub. No gallop.     Comments: No peripheral edema Pulmonary:     Effort: Pulmonary effort is normal. No respiratory distress.     Breath sounds: Normal breath sounds.  Skin:    General: Skin is warm and dry.     Findings: No rash.  Psychiatric:        Speech: Speech normal.        Behavior: Behavior normal.        Thought Content: Thought content normal.     Diabetic foot exam: Normal inspection No skin breakdown No calluses  Normal DP pulses Normal sensation to light touch and monofilament Nails normal     Results  for orders placed or performed in visit on 01/21/24  [facility] DIABETES FOOT EXAM   Collection Time: 01/21/24 12:00 AM  Result Value Ref Range   [facility] Diabetic Foot Exam Done      COVID 19 screen:  No recent travel or known exposure to COVID19 The patient denies respiratory symptoms of COVID 19 at this time. The importance of social distancing was discussed today.   Assessment and Plan   The patient's preventative maintenance and recommended screening tests for an annual wellness exam were  reviewed in full today. Brought up to date unless services declined.  Counselled on the importance of diet, exercise, and its role in overall health and mortality. The patient's FH and SH was reviewed, including their home life, tobacco status, and drug and alcohol status.     Refused flu vaccines given  intolerant, uptodate with td , UTD prevnar 20 and    Consider shingrix No family history of prostate cancer..  Due for reevaluation Lab Results  Component Value Date   PSA 0.62 01/01/2023   PSA 0.62 01/06/2022   PSA 0.48 03/12/2020  Colon [place]:  no family colon cancer. Last colon in 2014, sessile polyp Dr. Laurena, Cologuard negative 2024.  Hep C: done  HIV : done  nonsmoker Up-to-date with yearly eye exam January 28, 2023  . Problem List Items Addressed This Visit     Asthma, moderate persistent (Chronic)    Chronic, improved control back on Advair 100/50 twice daily.  follow-up with pulmonology.        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  Minimal side effects but no weight loss. Due for foot exam, diabetic kidney evaluation/microalbumin and recheck of A1c.  Associated with hypertension      Relevant Medications   tadalafil  (CIALIS ) 5 MG tablet   Other Relevant Orders   Hemoglobin A1c   Lipid panel   Comprehensive metabolic panel   Hyperlipidemia associated with type 2  diabetes mellitus (HCC) (Chronic)   Due for reevaluation  No side effects to atorvastatin  10 mg daily.      Relevant Medications   tadalafil  (CIALIS ) 5 MG tablet   Hypertension associated with diabetes (HCC) (Chronic)   Chronic, usually well controlled but slightly high in office initially today   On lisinopril  hydrochlorothiazide  20/12.5 mg 2 tablets p.o. daily      Relevant Medications   tadalafil  (CIALIS ) 5 MG tablet   Morbid obesity (HCC) (Chronic)   Encouraged exercise, weight loss, healthy eating habits. Minimal weight loss with semaglutide  2mg  p.o. weekly, will consider change to Mounjaro.  And continue working on lifestyle changes.      Obstructive sleep apnea (Chronic)   ON CPAP Needs weight loss.      Other Visit Diagnoses       Routine general medical examination at a health care facility    -  Primary     Prostate cancer screening       Relevant Orders   PSA, Medicare         Greig Ring, MD

## 2024-01-21 NOTE — Assessment & Plan Note (Signed)
 Encouraged exercise, weight loss, healthy eating habits. Minimal weight loss with semaglutide  2mg  p.o. weekly, will consider change to Mounjaro.  And continue working on lifestyle changes.

## 2024-01-21 NOTE — Assessment & Plan Note (Signed)
 Due for reevaluation  No side effects to atorvastatin  10 mg daily.

## 2024-01-21 NOTE — Assessment & Plan Note (Signed)
ON CPAP Needs weight loss.

## 2024-01-21 NOTE — Assessment & Plan Note (Signed)
 Chronic, usually well controlled but slightly high in office initially today   On lisinopril  hydrochlorothiazide  20/12.5 mg 2 tablets p.o. daily

## 2024-01-21 NOTE — Assessment & Plan Note (Signed)
 Chronic, previously well-controlled on semaglutide  2 mg weekly.  Minimal side effects but no weight loss. Due for foot exam, diabetic kidney evaluation/microalbumin and recheck of A1c.  Associated with hypertension

## 2024-01-25 ENCOUNTER — Ambulatory Visit: Payer: Self-pay | Admitting: Family Medicine

## 2024-03-12 ENCOUNTER — Other Ambulatory Visit: Payer: Self-pay | Admitting: Family Medicine

## 2024-05-23 ENCOUNTER — Other Ambulatory Visit: Payer: Self-pay | Admitting: Family Medicine

## 2024-05-26 ENCOUNTER — Other Ambulatory Visit: Payer: Self-pay | Admitting: Internal Medicine

## 2024-05-26 ENCOUNTER — Telehealth: Payer: Self-pay

## 2024-05-26 MED ORDER — FLUTICASONE-SALMETEROL 100-50 MCG/ACT IN AEPB: INHALATION_SPRAY | RESPIRATORY_TRACT | 1 refills | Status: AC

## 2024-05-26 NOTE — Telephone Encounter (Signed)
 Copied from CRM #[id number]. Topic: Clinical - Order For Equipment >> May 26, 2024 12:43 PM Carlos Day wrote: Reason for CRM: patient states he needs to confirm that CPAP readings are being received or what he needs to do to have the data come through - he states he talked to Day nurse about this at last visit in August 2025 but never heard back   Spoke with patient [facility], sent message to [employer] tag him new machine

## 2024-12-12 ENCOUNTER — Ambulatory Visit
